# Patient Record
Sex: Female | Born: 1960 | Race: Black or African American | Hispanic: No | Marital: Married | Smoking: Never smoker
Health system: Southern US, Community
[De-identification: ages and names within clinical notes are randomized; demographics above are authoritative.]

## PROBLEM LIST (undated history)

## (undated) DIAGNOSIS — E669 Obesity, unspecified: Secondary | ICD-10-CM

## (undated) DIAGNOSIS — E785 Hyperlipidemia, unspecified: Secondary | ICD-10-CM

## (undated) DIAGNOSIS — T7840XA Allergy, unspecified, initial encounter: Secondary | ICD-10-CM

## (undated) DIAGNOSIS — I10 Essential (primary) hypertension: Secondary | ICD-10-CM

## (undated) DIAGNOSIS — E119 Type 2 diabetes mellitus without complications: Secondary | ICD-10-CM

## (undated) HISTORY — DX: Type 2 diabetes mellitus without complications: E11.9

## (undated) HISTORY — DX: Obesity, unspecified: E66.9

## (undated) HISTORY — DX: Essential (primary) hypertension: I10

## (undated) HISTORY — PX: TUBAL LIGATION: SHX77

## (undated) HISTORY — PX: UMBILICAL HERNIA REPAIR: SHX196

## (undated) HISTORY — DX: Hyperlipidemia, unspecified: E78.5

## (undated) HISTORY — DX: Allergy, unspecified, initial encounter: T78.40XA

---

## 1998-02-21 ENCOUNTER — Ambulatory Visit (HOSPITAL_COMMUNITY): Admission: RE | Admit: 1998-02-21 | Discharge: 1998-02-21 | Payer: Self-pay | Admitting: Family Medicine

## 1998-02-21 ENCOUNTER — Encounter: Payer: Self-pay | Admitting: Family Medicine

## 1998-07-26 ENCOUNTER — Encounter: Payer: Self-pay | Admitting: Obstetrics

## 1998-07-26 ENCOUNTER — Ambulatory Visit (HOSPITAL_COMMUNITY): Admission: RE | Admit: 1998-07-26 | Discharge: 1998-07-26 | Payer: Self-pay | Admitting: Obstetrics

## 1999-02-16 ENCOUNTER — Encounter: Payer: Self-pay | Admitting: Family Medicine

## 1999-02-16 ENCOUNTER — Ambulatory Visit: Admission: RE | Admit: 1999-02-16 | Discharge: 1999-02-16 | Payer: Self-pay | Admitting: Family Medicine

## 1999-08-08 ENCOUNTER — Other Ambulatory Visit: Admission: RE | Admit: 1999-08-08 | Discharge: 1999-08-08 | Payer: Self-pay | Admitting: Obstetrics

## 1999-08-14 ENCOUNTER — Ambulatory Visit (HOSPITAL_COMMUNITY): Admission: RE | Admit: 1999-08-14 | Discharge: 1999-08-14 | Payer: Self-pay | Admitting: Family Medicine

## 1999-08-14 ENCOUNTER — Encounter: Payer: Self-pay | Admitting: Family Medicine

## 2004-09-27 ENCOUNTER — Ambulatory Visit (HOSPITAL_COMMUNITY): Admission: RE | Admit: 2004-09-27 | Discharge: 2004-09-27 | Payer: Self-pay | Admitting: Internal Medicine

## 2004-11-01 ENCOUNTER — Other Ambulatory Visit: Admission: RE | Admit: 2004-11-01 | Discharge: 2004-11-01 | Payer: Self-pay | Admitting: Obstetrics and Gynecology

## 2005-01-18 ENCOUNTER — Ambulatory Visit (HOSPITAL_COMMUNITY): Admission: RE | Admit: 2005-01-18 | Discharge: 2005-01-18 | Payer: Self-pay | Admitting: Obstetrics and Gynecology

## 2005-04-18 ENCOUNTER — Ambulatory Visit (HOSPITAL_COMMUNITY): Admission: RE | Admit: 2005-04-18 | Discharge: 2005-04-18 | Payer: Self-pay | Admitting: Obstetrics and Gynecology

## 2005-06-01 ENCOUNTER — Ambulatory Visit (HOSPITAL_COMMUNITY): Admission: RE | Admit: 2005-06-01 | Discharge: 2005-06-01 | Payer: Self-pay | Admitting: Obstetrics and Gynecology

## 2005-09-07 ENCOUNTER — Ambulatory Visit (HOSPITAL_COMMUNITY): Admission: RE | Admit: 2005-09-07 | Discharge: 2005-09-07 | Payer: Self-pay | Admitting: Obstetrics and Gynecology

## 2007-09-15 ENCOUNTER — Ambulatory Visit (HOSPITAL_COMMUNITY): Admission: RE | Admit: 2007-09-15 | Discharge: 2007-09-15 | Payer: Self-pay | Admitting: Internal Medicine

## 2010-07-28 NOTE — H&P (Signed)
NAMEDENEE, BOEDER                ACCOUNT NO.:  [id number]   MEDICAL RECORD NO.:  [id number]          PATIENT TYPE:  AMB   LOCATION:                                F[facility]ILITY:  [facility]   PHYSICIAN:  Duke Salvia. Marcelle Overlie, M.D.    DATE OF BIRTH:   DATE OF ADMISSION:  01/18/2005  DATE OF DISCHARGE:                                HISTORY & PHYSICAL   CHIEF COMPLAINT:  Request Essure tubal.   HISTORY OF PRESENT ILLNESS:  A 50 year old G2, P2.  Patient is currently on  Ovcon.  Was told previously she could not have a laparoscopic tubal due to  her weight of 311.  Presents now for Essure tubal.  She has been on Ovcon  and understands the need for contraception post procedure.  This procedure  including risks of bleeding, infection, other complications such as  perforation with a micro insert and the need for postoperative contraception  along with the possibility that we may not be able to place the inserts all  reviewed with her which she understands and accepts.   PAST MEDICAL HISTORY:   ALLERGIES:  None.   OPERATIONS:  Cesarean section x2.   Currently on HCTZ, Diovan, and Ovcon 35.   FAMILY HISTORY:  Significant for father who had throat cancer, otherwise  negative.   PHYSICAL EXAMINATION:  VITAL SIGNS:  Temperature 98.7, blood pressure  126/94, weight 311.  HEENT:  Unremarkable.  NECK:  Supple without masses.  LUNGS:  Clear.  CARDIOVASCULAR:  Regular rate and rhythm without murmurs, rubs, or gallops  noted.  BREASTS:  Without masses.  ABDOMEN:  Soft, flat, and nontender.  PELVIC:  Normal external genitalia.  Vagina and cervix clear.  Uterus mid  position, normal size.  Adnexa negative.  Examination difficult due to her  weight.  EXTREMITIES:  Unremarkable.  NEUROLOGIC:  Unremarkable.   IMPRESSION:  Hysteroscopy with Essure placement.  Procedure and risks  discussed as above.     Richard M. Marcelle Overlie, M.D.  Electronically Signed    RMH/MEDQ  D:  01/15/2005  T:  01/15/2005   Job:  [id number]

## 2010-07-28 NOTE — Op Note (Signed)
[name], [name]                ACCOUNT NO.:  [id number]   MEDICAL RECORD NO.:  [id number]          PATIENT TYPE:  AMB   LOCATION:  [facility]                           [facility]ILITY:  [facility]   PHYSICIAN:  Duke Salvia. Marcelle Overlie, M.D.DATE OF BIRTH:  Jan 13, 1961   DATE OF PROCEDURE:  01/18/2005  DATE OF DISCHARGE:                                 OPERATIVE REPORT   PREOPERATIVE DIAGNOSIS:  Request permanent sterilization.   POSTOPERATIVE DIAGNOSIS:  Request permanent sterilization.   PROCEDURE:  Hysteroscopy with Essure micro inserts.   ANESTHESIA:  General endotracheal.   COMPLICATIONS:  None.   DRAINS:  In-and-out Foley catheter.   BLOOD LOSS:  Minimal.   DESCRIPTION OF PROCEDURE:  The patient was taken to the operating room and  after adequate level of general endotracheal anesthesia was obtained with  the patient legs stirrups. The perineum and vagina prepped and draped with  Betadine and the bladder was drained, EUA was carried out.  The uterus was  midposition and normal size. Adnexa negative. Exam difficult due to her  weight of 311.   Weighted speculum was positioned. Cervix grasped with tenaculum.  Paracervical block created. The uterus then sounded to 10 cm and  progressively dilated to a 29 Pratt dilator. The 7 mm continuous flow  hysteroscope was inserted. There was very thin endometrium. Both tubal ostia  could be readily identified. Starting on her left, the Essure micro insert  was inserted and disengaged per protocol with 0 coils showing on that side.  On the right side she had after engaging the device 15 coils showing on the  right with placement per protocol. She tolerated this well and went to  recovery room in good condition.      Richard M. Marcelle Overlie, M.D.  Electronically Signed     RMH/MEDQ  D:  01/18/2005  T:  01/18/2005  Job:  [id number]

## 2010-07-28 NOTE — Op Note (Signed)
[name], [name]                ACCOUNT NO.:  [id number]   MEDICAL RECORD NO.:  [id number]          PATIENT TYPE:  AMB   LOCATION:  [facility]                           [facility]   PHYSICIAN:  Duke Salvia. Marcelle Overlie, M.D.DATE OF BIRTH:  [birth year]   DATE OF PROCEDURE:  06/01/2005  DATE OF DISCHARGE:  06/01/2005                                 OPERATIVE REPORT   PREOPERATIVE DIAGNOSIS:  Requests permanent sterilization.   POSTOPERATIVE DIAGNOSIS:  Requests permanent sterilization.   PROCEDURE:  Replacement of the right-sided Essure microimplant.  Her initial  postop HSG had shown occlusion of the left only with expulsion of the right  device.  The procedure was hysteroscopy with placement of Essure micro  insert on the right tube.  This patient did receive Toradol IM  preoperatively along with prophylactic antibiotics.   DESCRIPTION OF PROCEDURE:  The patient was taken to the operating room and  after adequate level of general endotracheal anesthesia was obtained with  the patient's legs in stirrups, perineum and vagina prepped and draped with  Betadine.  Paracervical block was created by infiltrating at 3 and 9 o'clock  submucosally 5-7 ml 1% Xylocaine on either side after negative aspiration.  The cervix was then dilated to a 27 Pratt dilator. A 5.5 mm continuous flow  hysteroscope was inserted.  The right tube could be easily identified with  careful positioning. The Essure device was deployed per protocol with 2-3  remaining coils visible after application.  The left side had previously  been occluded and had been documented.  She tolerated this well and went to  recovery room in good condition.      Richard M. Marcelle Overlie, M.D.  Electronically Signed     RMH/MEDQ  D:  06/01/2005  T:  06/04/2005  Job:  [id number]

## 2010-07-28 NOTE — H&P (Signed)
NAMEWENDY, [name] NO.:  [id number]   MEDICAL RECORD NO.:  [id number]          PATIENT TYPE:  AMB   LOCATION:                                [facility]   PHYSICIAN:  Duke Salvia. Marcelle Overlie, M.D.DATE OF BIRTH:  February 02, 1961   DATE OF ADMISSION:  06/01/2005  DATE OF DISCHARGE:  06/01/2005                                HISTORY & PHYSICAL   CHIEF COMPLAINT:  For Essure tubal ligation.   HISTORY OF PRESENT ILLNESS:  A 50 year old, currently on 61.  She is a  gravida 2, para 2.  She underwent Essure tubal January 18, 2005 when we did  her first three-month postoperative x-ray.  She had good occlusion on the  left.  We could not find the insert on the right side.  Presumably it was  passed vaginally.  Since it could not be located. She has continued to be on  her pills and consents for reinsertion of the right insert.  Risk of  bleeding, infection, possible need for additional surgery, all reviewed  including the need for contraception for the ensuing three months.   ALLERGIES:  None.   CURRENT MEDICATIONS:  Ovcon, hydrochlorothiazide, and Diovan.   FAMILY HISTORY:  Significant for father with throat cancer and the patient  has had high blood pressure and is being treated.   PHYSICAL EXAMINATION:  VITAL SIGNS:  Temperature 98.2, blood pressure  126/94, weight 311.  HEENT:  Unremarkable.  NECK:  Supple without masses.  LUNGS:  Clear.  CARDIOVASCULAR:  Regular rate and rhythm without murmurs, rubs or gallops.  BREASTS:  Without masses.  ABDOMEN:  Soft, flat and nontender.  PELVIC:  Normal external genitalia.  Vagina and cervix clear.  Uterus normal  position and size.   IMPRESSION:  Normal exam.   PLAN:  Repeat Essure, right side.  Procedure and risks reviewed as above.      Richard M. Marcelle Overlie, M.D.  Electronically Signed     RMH/MEDQ  D:  05/31/2005  T:  05/31/2005  Job:  [id number]

## 2013-01-26 ENCOUNTER — Other Ambulatory Visit: Payer: Self-pay | Admitting: Internal Medicine

## 2013-01-26 DIAGNOSIS — E782 Mixed hyperlipidemia: Secondary | ICD-10-CM

## 2013-01-26 MED ORDER — SIMVASTATIN 10 MG PO TABS: 10.0000 mg | ORAL_TABLET | Freq: Every evening | ORAL | Status: DC

## 2013-02-16 ENCOUNTER — Encounter: Payer: Self-pay | Admitting: Internal Medicine

## 2013-02-16 DIAGNOSIS — R7303 Prediabetes: Secondary | ICD-10-CM | POA: Insufficient documentation

## 2013-02-16 DIAGNOSIS — E785 Hyperlipidemia, unspecified: Secondary | ICD-10-CM | POA: Insufficient documentation

## 2013-02-16 DIAGNOSIS — I10 Essential (primary) hypertension: Secondary | ICD-10-CM | POA: Insufficient documentation

## 2013-02-16 DIAGNOSIS — Z9109 Other allergy status, other than to drugs and biological substances: Secondary | ICD-10-CM | POA: Insufficient documentation

## 2013-02-17 ENCOUNTER — Ambulatory Visit (INDEPENDENT_AMBULATORY_CARE_PROVIDER_SITE_OTHER): Payer: 59 | Admitting: Emergency Medicine

## 2013-02-17 ENCOUNTER — Encounter: Payer: Self-pay | Admitting: Emergency Medicine

## 2013-02-17 VITALS — BP 128/88 | HR 76 | Temp 98.6°F | Resp 18 | Ht 65.5 in | Wt 310.0 lb

## 2013-02-17 DIAGNOSIS — R062 Wheezing: Secondary | ICD-10-CM

## 2013-02-17 DIAGNOSIS — E782 Mixed hyperlipidemia: Secondary | ICD-10-CM

## 2013-02-17 DIAGNOSIS — Z79899 Other long term (current) drug therapy: Secondary | ICD-10-CM

## 2013-02-17 DIAGNOSIS — R05 Cough: Secondary | ICD-10-CM

## 2013-02-17 DIAGNOSIS — E669 Obesity, unspecified: Secondary | ICD-10-CM

## 2013-02-17 DIAGNOSIS — I10 Essential (primary) hypertension: Secondary | ICD-10-CM

## 2013-02-17 DIAGNOSIS — E119 Type 2 diabetes mellitus without complications: Secondary | ICD-10-CM

## 2013-02-17 DIAGNOSIS — E559 Vitamin D deficiency, unspecified: Secondary | ICD-10-CM

## 2013-02-17 LAB — CBC WITH DIFFERENTIAL/PLATELET
Basophils Absolute: 0 10*3/uL (ref 0.0–0.1)
Basophils Relative: 0 % (ref 0–1)
Hemoglobin: 12.9 g/dL (ref 12.0–15.0)
Lymphocytes Relative: 46 % (ref 12–46)
Lymphs Abs: 2.5 10*3/uL (ref 0.7–4.0)
MCHC: 34.5 g/dL (ref 30.0–36.0)
MCV: 82.7 fL (ref 78.0–100.0)
Monocytes Relative: 9 % (ref 3–12)
Neutro Abs: 2.4 10*3/uL (ref 1.7–7.7)
Neutrophils Relative %: 43 % (ref 43–77)
Platelets: 387 10*3/uL (ref 150–400)
RBC: 4.52 MIL/uL (ref 3.87–5.11)
RDW: 14.8 % (ref 11.5–15.5)
WBC: 5.5 10*3/uL (ref 4.0–10.5)

## 2013-02-17 LAB — HEMOGLOBIN A1C
Hgb A1c MFr Bld: 6.2 % — ABNORMAL HIGH (ref <5.7)
Mean Plasma Glucose: 131 mg/dL — ABNORMAL HIGH (ref <117)

## 2013-02-17 LAB — BASIC METABOLIC PANEL WITH GFR
CO2: 29 mEq/L (ref 19–32)
Calcium: 9.2 mg/dL (ref 8.4–10.5)
GFR, Est African American: 89 mL/min

## 2013-02-17 LAB — HEPATIC FUNCTION PANEL
AST: 20 U/L (ref 0–37)
Bilirubin, Direct: 0.1 mg/dL (ref 0.0–0.3)
Indirect Bilirubin: 0.4 mg/dL (ref 0.0–0.9)

## 2013-02-17 LAB — LIPID PANEL
Triglycerides: 123 mg/dL (ref <150)
VLDL: 25 mg/dL (ref 0–40)

## 2013-02-17 MED ORDER — BENZONATATE 100 MG PO CAPS: 100.0000 mg | ORAL_CAPSULE | Freq: Three times a day (TID) | ORAL | Status: DC | PRN

## 2013-02-17 MED ORDER — MONTELUKAST SODIUM 10 MG PO TABS: 10.0000 mg | ORAL_TABLET | Freq: Every day | ORAL | Status: DC

## 2013-02-17 MED ORDER — AZITHROMYCIN 250 MG PO TABS: ORAL_TABLET | ORAL | Status: DC

## 2013-02-17 MED ORDER — ALBUTEROL SULFATE HFA 108 (90 BASE) MCG/ACT IN AERS: 2.0000 | INHALATION_SPRAY | Freq: Four times a day (QID) | RESPIRATORY_TRACT | Status: DC | PRN

## 2013-02-17 MED ORDER — PREDNISONE 10 MG PO TABS: ORAL_TABLET | ORAL | Status: DC

## 2013-02-17 NOTE — Patient Instructions (Addendum)
Fat and Cholesterol Control Diet Fat and cholesterol levels in your blood and organs are influenced by your diet. High levels of fat and cholesterol may lead to diseases of the heart, small and large blood vessels, gallbladder, liver, and pancreas. CONTROLLING FAT AND CHOLESTEROL WITH DIET Although exercise and lifestyle factors are important, your diet is key. That is because certain foods are known to raise cholesterol and others to lower it. The goal is to balance foods for their effect on cholesterol and more importantly, to replace saturated and trans fat with other types of fat, such as monounsaturated fat, polyunsaturated fat, and omega-3 fatty acids. On average, a person should consume no more than 15 to 17 g of saturated fat daily. Saturated and trans fats are considered "bad" fats, and they will raise LDL cholesterol. Saturated fats are primarily found in animal products such as meats, butter, and cream. However, that does not mean you need to give up all your favorite foods. Today, there are good tasting, low-fat, low-cholesterol substitutes for most of the things you like to eat. Choose low-fat or nonfat alternatives. Choose round or loin cuts of red meat. These types of cuts are lowest in fat and cholesterol. Chicken (without the skin), fish, veal, and ground turkey breast are great choices. Eliminate fatty meats, such as hot dogs and salami. Even shellfish have little or no saturated fat. Have a 3 oz (85 g) portion when you eat lean meat, poultry, or fish. Trans fats are also called "partially hydrogenated oils." They are oils that have been scientifically manipulated so that they are solid at room temperature resulting in a longer shelf life and improved taste and texture of foods in which they are added. Trans fats are found in stick margarine, some tub margarines, cookies, crackers, and baked goods.  When baking and cooking, oils are a great substitute for butter. The monounsaturated oils are  especially beneficial since it is believed they lower LDL and raise HDL. The oils you should avoid entirely are saturated tropical oils, such as coconut and palm.  Remember to eat a lot from food groups that are naturally free of saturated and trans fat, including fish, fruit, vegetables, beans, grains (barley, rice, couscous, bulgur wheat), and pasta (without cream sauces).  IDENTIFYING FOODS THAT LOWER FAT AND CHOLESTEROL  Soluble fiber may lower your cholesterol. This type of fiber is found in fruits such as apples, vegetables such as broccoli, potatoes, and carrots, legumes such as beans, peas, and lentils, and grains such as barley. Foods fortified with plant sterols (phytosterol) may also lower cholesterol. You should eat at least 2 g per day of these foods for a cholesterol lowering effect.  Read package labels to identify low-saturated fats, trans fat free, and low-fat foods at the supermarket. Select cheeses that have only 2 to 3 g saturated fat per ounce. Use a heart-healthy tub margarine that is free of trans fats or partially hydrogenated oil. When buying baked goods (cookies, crackers), avoid partially hydrogenated oils. Breads and muffins should be made from whole grains (whole-wheat or whole oat flour, instead of "flour" or "enriched flour"). Buy non-creamy canned soups with reduced salt and no added fats.  FOOD PREPARATION TECHNIQUES  Never deep-fry. If you must fry, either stir-fry, which uses very little fat, or use non-stick cooking sprays. When possible, broil, bake, or roast meats, and steam vegetables. Instead of putting butter or margarine on vegetables, use lemon and herbs, applesauce, and cinnamon (for squash and sweet potatoes). Use nonfat   yogurt, salsa, and low-fat dressings for salads.  LOW-SATURATED FAT / LOW-FAT FOOD SUBSTITUTES Meats / Saturated Fat (g)  Avoid: Steak, marbled (3 oz/85 g) / 11 g  Choose: Steak, lean (3 oz/85 g) / 4 g  Avoid: Hamburger (3 oz/85 g) / 7  g  Choose: Hamburger, lean (3 oz/85 g) / 5 g  Avoid: Ham (3 oz/85 g) / 6 g  Choose: Ham, lean cut (3 oz/85 g) / 2.4 g  Avoid: Chicken, with skin, dark meat (3 oz/85 g) / 4 g  Choose: Chicken, skin removed, dark meat (3 oz/85 g) / 2 g  Avoid: Chicken, with skin, light meat (3 oz/85 g) / 2.5 g  Choose: Chicken, skin removed, light meat (3 oz/85 g) / 1 g Dairy / Saturated Fat (g)  Avoid: Whole milk (1 cup) / 5 g  Choose: Low-fat milk, 2% (1 cup) / 3 g  Choose: Low-fat milk, 1% (1 cup) / 1.5 g  Choose: Skim milk (1 cup) / 0.3 g  Avoid: Hard cheese (1 oz/28 g) / 6 g  Choose: Skim milk cheese (1 oz/28 g) / 2 to 3 g  Avoid: Cottage cheese, 4% fat (1 cup) / 6.5 g  Choose: Low-fat cottage cheese, 1% fat (1 cup) / 1.5 g  Avoid: Ice cream (1 cup) / 9 g  Choose: Sherbet (1 cup) / 2.5 g  Choose: Nonfat frozen yogurt (1 cup) / 0.3 g  Choose: Frozen fruit bar / trace  Avoid: Whipped cream (1 tbs) / 3.5 g  Choose: Nondairy whipped topping (1 tbs) / 1 g Condiments / Saturated Fat (g)  Avoid: Mayonnaise (1 tbs) / 2 g  Choose: Low-fat mayonnaise (1 tbs) / 1 g  Avoid: Butter (1 tbs) / 7 g  Choose: Extra light margarine (1 tbs) / 1 g  Avoid: Coconut oil (1 tbs) / 11.8 g  Choose: Olive oil (1 tbs) / 1.8 g  Choose: Corn oil (1 tbs) / 1.7 g  Choose: Safflower oil (1 tbs) / 1.2 g  Choose: Sunflower oil (1 tbs) / 1.4 g  Choose: Soybean oil (1 tbs) / 2.4 g  Choose: Canola oil (1 tbs) / 1 g Document Released: 02/26/2005 Document Revised: 06/23/2012 Document Reviewed: 08/17/2010 [employer] Patient Information 2014 [place], [place]. Diabetes and Exercise Exercising regularly is important. It is not just about losing weight. It has many health benefits, such as:  Improving your overall fitness, flexibility, and endurance.  Increasing your bone density.  Helping with weight control.  Decreasing your body fat.  Increasing your muscle strength.  Reducing stress and  tension.  Improving your overall health. People with diabetes who exercise gain additional benefits because exercise:  Reduces appetite.  Improves the body's use of blood sugar (glucose).  Helps lower or control blood glucose.  Decreases blood pressure.  Helps control blood lipids (such as cholesterol and triglycerides).  Improves the body's use of the hormone insulin by:  Increasing the body's insulin sensitivity.  Reducing the body's insulin needs.  Decreases the risk for heart disease because exercising:  Lowers cholesterol and triglycerides levels.  Increases the levels of good cholesterol (such as high-density lipoproteins [HDL]) in the body.  Lowers blood glucose levels. YOUR ACTIVITY PLAN  Choose an activity that you enjoy and set realistic goals. Your health care provider or diabetes educator can help you make an activity plan that works for you. You can break activities into 2 or 3 sessions throughout the day. Doing so is as good  as one long session. Exercise ideas include:  Taking the dog for a walk.  Taking the stairs instead of the elevator.  Dancing to your favorite song.  Doing your favorite exercise with a friend. RECOMMENDATIONS FOR EXERCISING WITH TYPE 1 OR TYPE 2 DIABETES   Check your blood glucose before exercising. If blood glucose levels are greater than 240 mg/dL, check for urine ketones. Do not exercise if ketones are present.  Avoid injecting insulin into areas of the body that are going to be exercised. For example, avoid injecting insulin into:  The arms when playing tennis.  The legs when jogging.  Keep a record of:  Food intake before and after you exercise.  Expected peak times of insulin action.  Blood glucose levels before and after you exercise.  The type and amount of exercise you have done.  Review your records with your health care provider. Your health care provider will help you to develop guidelines for adjusting food  intake and insulin amounts before and after exercising.  If you take insulin or oral hypoglycemic agents, watch for signs and symptoms of hypoglycemia. They include:  Dizziness.  Shaking.  Sweating.  Chills.  Confusion.  Drink plenty of water while you exercise to prevent dehydration or heat stroke. Body water is lost during exercise and must be replaced.  Talk to your health care provider before starting an exercise program to make sure it is safe for you. Remember, almost any type of activity is better than none. Document Released: 05/19/2003 Document Revised: 10/29/2012 Document Reviewed: 08/05/2012 [facility] Patient Information 2014 [place], [place]. Bronchitis Bronchitis is a problem of the air tubes leading to your lungs. This problem makes it hard for air to get in and out of the lungs. You may cough a lot because your air tubes are narrow. Going without care can cause lasting (chronic) bronchitis. HOME CARE   Drink enough fluids to keep your pee (urine) clear or pale yellow.  Use a cool mist humidifier.  Quit smoking if you smoke. If you keep smoking, the bronchitis might not get better.  Only take medicine as told by your doctor. GET HELP RIGHT AWAY IF:   Coughing keeps you awake.  You start to wheeze.  You become more sick or weak.  You have a hard time breathing or get short of breath.  You cough up blood.  Coughing lasts more than 2 weeks.  You have a fever.  Your baby is older than 3 months with a rectal temperature of 102 F (38.9 C) or higher.  Your baby is 7 months old or younger with a rectal temperature of 100.4 F (38 C) or higher. MAKE SURE YOU:  Understand these instructions.  Will watch your condition.  Will get help right away if you are not doing well or get worse. Document Released: 08/15/2007 Document Revised: 05/21/2011 Document Reviewed: 10/21/2012 [facility] Patient Information 2014 [place], [place].

## 2013-02-18 ENCOUNTER — Encounter: Payer: Self-pay | Admitting: Emergency Medicine

## 2013-02-18 NOTE — Progress Notes (Signed)
Subjective:    Patient ID: [name], female    DOB: 06/16/1960, 52 y.o.   MRN: [id number]  HPI Comments: 52 yo AAF presents for 3 month F/U for obesity, HTN, Cholesterol, DM, D. Deficient. She started metformin QD AD but d/c due to mild stomach upset. She is trying to improve diet and lose weight. She is not exercising routinely. LAST ABN LABS T 202 LDL 129 A1C 6.5 D 20 Hr Bp has been good at home. BS she does not routinely check  She has noticed increased wheezing and hard cough over last 2 weeks that occasional makes her vomit with the forceful spasms. No relief with Mucinex. She denies reflux or chest production.   Hypertension  Hyperlipidemia    Current Outpatient Prescriptions on File Prior to Visit  Medication Sig Dispense Refill  . Azelastine-Fluticasone 137-50 MCG/ACT SUSP Place into the nose.      . cetirizine (ZYRTEC) 10 MG tablet Take 10 mg by mouth daily.      . ergocalciferol (VITAMIN D2) 50000 UNITS capsule Take 50,000 Units by mouth once a week.      . metFORMIN (GLUCOPHAGE-XR) 500 MG 24 hr tablet Take 500 mg by mouth 3 (three) times daily.      . mometasone (NASONEX) 50 MCG/ACT nasal spray Place 2 sprays into the nose as needed.      . ranitidine (ZANTAC) 75 MG tablet Take 75 mg by mouth 2 (two) times daily.      . simvastatin (ZOCOR) 10 MG tablet Take 1 tablet (10 mg total) by mouth every evening.  90 tablet  3  . valsartan-hydrochlorothiazide (DIOVAN-HCT) 320-25 MG per tablet Take 1 tablet by mouth daily.       No current facility-administered medications on file prior to visit.    Review of patient's allergies indicates no known allergies.  Past Medical History  Diagnosis Date  . Type II or unspecified type diabetes mellitus without mention of complication, not stated as uncontrolled   . Hypertension   . Hyperlipidemia   . Allergy   . Obesity      Review of Systems  Respiratory: Positive for cough and wheezing.   All other systems reviewed and are  negative.    BP 128/88  Pulse 76  Temp(Src) 98.6 F (37 C) (Temporal)  Resp 18  Ht 5' 5.5" (1.664 m)  Wt 310 lb (140.615 kg)  BMI 50.78 kg/m2  LMP 02/04/2013     Objective:   Physical Exam  Nursing note and vitals reviewed. Constitutional: She is oriented to person, place, and time. She appears well-developed and well-nourished. No distress.  Obese  HENT:  Head: Normocephalic and atraumatic.  Right Ear: External ear normal.  Left Ear: External ear normal.  Nose: Nose normal.  Mouth/Throat: Oropharynx is clear and moist. No oropharyngeal exudate.  Cloudy TMs  Eyes: Conjunctivae and EOM are normal.  Neck: Normal range of motion. Neck supple. No JVD present. No thyromegaly present.  Cardiovascular: Normal rate, regular rhythm, normal heart sounds and intact distal pulses.   Pulmonary/Chest: Effort normal. She has wheezes.  Clears with hard cough  Abdominal: Soft. Bowel sounds are normal. She exhibits no distension and no mass. There is no tenderness. There is no rebound and no guarding.  Musculoskeletal: Normal range of motion. She exhibits no edema and no tenderness.  Lymphadenopathy:    She has no cervical adenopathy.  Neurological: She is alert and oriented to person, place, and time. No cranial nerve deficit.  Skin: Skin is warm and dry. No rash noted. No erythema. No pallor.  Psychiatric: She has a normal mood and affect. Her behavior is normal. Judgment and thought content normal.          Assessment & Plan:  1. 3 month F/U for Obesity, HTN, Cholesterol, DM, D. Deficient- Check labs, need significant weight loss, healthier diet and cardio QD, Restart MF with largest meal call with results 2. Cough with wheeze- Albuterol HFA, Pred 10 mg DP, Singulair 10 mg, tessalon Perles AD All AD, if increase with symptoms ZPAK AD. If completes treatment and change may need eval for reflux vs chronic bronchitis

## 2013-02-20 ENCOUNTER — Ambulatory Visit: Payer: Self-pay | Admitting: Emergency Medicine

## 2013-03-31 ENCOUNTER — Other Ambulatory Visit: Payer: Self-pay | Admitting: Internal Medicine

## 2013-08-19 ENCOUNTER — Ambulatory Visit (INDEPENDENT_AMBULATORY_CARE_PROVIDER_SITE_OTHER): Payer: 59 | Admitting: Emergency Medicine

## 2013-08-19 ENCOUNTER — Encounter: Payer: Self-pay | Admitting: Emergency Medicine

## 2013-08-19 VITALS — BP 154/108 | HR 68 | Temp 98.2°F | Resp 18 | Ht 65.5 in | Wt 316.0 lb

## 2013-08-19 DIAGNOSIS — E559 Vitamin D deficiency, unspecified: Secondary | ICD-10-CM

## 2013-08-19 DIAGNOSIS — M25519 Pain in unspecified shoulder: Secondary | ICD-10-CM

## 2013-08-19 DIAGNOSIS — E782 Mixed hyperlipidemia: Secondary | ICD-10-CM

## 2013-08-19 DIAGNOSIS — Z0000 Encounter for general adult medical examination without abnormal findings: Secondary | ICD-10-CM

## 2013-08-19 DIAGNOSIS — Z111 Encounter for screening for respiratory tuberculosis: Secondary | ICD-10-CM

## 2013-08-19 DIAGNOSIS — E669 Obesity, unspecified: Secondary | ICD-10-CM

## 2013-08-19 DIAGNOSIS — Z79899 Other long term (current) drug therapy: Secondary | ICD-10-CM

## 2013-08-19 DIAGNOSIS — Z23 Encounter for immunization: Secondary | ICD-10-CM

## 2013-08-19 DIAGNOSIS — I10 Essential (primary) hypertension: Secondary | ICD-10-CM

## 2013-08-19 LAB — HEMOGLOBIN A1C
HEMOGLOBIN A1C: 6.2 % — AB (ref <5.7)
Mean Plasma Glucose: 131 mg/dL — ABNORMAL HIGH (ref <117)

## 2013-08-19 LAB — CBC WITH DIFFERENTIAL/PLATELET
BASOS ABS: 0 10*3/uL (ref 0.0–0.1)
BASOS PCT: 0 % (ref 0–1)
Eosinophils Absolute: 0.1 10*3/uL (ref 0.0–0.7)
Eosinophils Relative: 1 % (ref 0–5)
HCT: 40.6 % (ref 36.0–46.0)
HEMOGLOBIN: 13.7 g/dL (ref 12.0–15.0)
LYMPHS ABS: 2.4 10*3/uL (ref 0.7–4.0)
Lymphocytes Relative: 48 % — ABNORMAL HIGH (ref 12–46)
MCH: 28.2 pg (ref 26.0–34.0)
MCHC: 33.7 g/dL (ref 30.0–36.0)
MCV: 83.7 fL (ref 78.0–100.0)
MONO ABS: 0.5 10*3/uL (ref 0.1–1.0)
Monocytes Relative: 9 % (ref 3–12)
Neutro Abs: 2.1 10*3/uL (ref 1.7–7.7)
Neutrophils Relative %: 42 % — ABNORMAL LOW (ref 43–77)
Platelets: 325 10*3/uL (ref 150–400)
RBC: 4.85 MIL/uL (ref 3.87–5.11)
RDW: 15 % (ref 11.5–15.5)
WBC: 5.1 10*3/uL (ref 4.0–10.5)

## 2013-08-19 NOTE — Patient Instructions (Addendum)
Tetanus, Diphtheria, Pertussis (Tdap) Vaccine What You Need to Know WHY GET VACCINATED? Tetanus, diphtheria and pertussis can be very serious diseases, even for adolescents and adults. Tdap vaccine can protect us from these diseases. TETANUS (Lockjaw) causes painful muscle tightening and stiffness, usually all over the body.  It can lead to tightening of muscles in the head and neck so you can't open your mouth, swallow, or sometimes even breathe. Tetanus kills about 1 out of 5 people who are infected. DIPHTHERIA can cause a thick coating to form in the back of the throat.  It can lead to breathing problems, paralysis, heart failure, and death. PERTUSSIS (Whooping Cough) causes severe coughing spells, which can cause difficulty breathing, vomiting and disturbed sleep.  It can also lead to weight loss, incontinence, and rib fractures. Up to 2 in 100 adolescents and 5 in 100 adults with pertussis are hospitalized or have complications, which could include pneumonia and death. These diseases are caused by bacteria. Diphtheria and pertussis are spread from person to person through coughing or sneezing. Tetanus enters the body through cuts, scratches, or wounds. Before vaccines, the [place] saw as many as 200,000 cases a year of diphtheria and pertussis, and hundreds of cases of tetanus. Since vaccination began, tetanus and diphtheria have dropped by about 99% and pertussis by about 80%. TDAP VACCINE Tdap vaccine can protect adolescents and adults from tetanus, diphtheria, and pertussis. One dose of Tdap is routinely given at age 11 or 12. People who did not get Tdap at that age should get it as soon as possible. Tdap is especially important for health care professionals and anyone having close contact with a baby younger than 12 months. Pregnant women should get a dose of Tdap during every pregnancy, to protect the newborn from pertussis. Infants are most at risk for severe, life-threatening  complications from pertussis. A similar vaccine, called Td, protects from tetanus and diphtheria, but not pertussis. A Td booster should be given every 10 years. Tdap may be given as one of these boosters if you have not already gotten a dose. Tdap may also be given after a severe cut or burn to prevent tetanus infection. Your doctor can give you more information. Tdap may safely be given at the same time as other vaccines. SOME PEOPLE SHOULD NOT GET THIS VACCINE  If you ever had a life-threatening allergic reaction after a dose of any tetanus, diphtheria, or pertussis containing vaccine, OR if you have a severe allergy to any part of this vaccine, you should not get Tdap. Tell your doctor if you have any severe allergies.  If you had a coma, or long or multiple seizures within 7 days after a childhood dose of DTP or DTaP, you should not get Tdap, unless a cause other than the vaccine was found. You can still get Td.  Talk to your doctor if you:  have epilepsy or another nervous system problem,  had severe pain or swelling after any vaccine containing diphtheria, tetanus or pertussis,  ever had Guillain-Barr Syndrome (GBS),  aren't feeling well on the day the shot is scheduled. RISKS OF A VACCINE REACTION With any medicine, including vaccines, there is a chance of side effects. These are usually mild and go away on their own, but serious reactions are also possible. Brief fainting spells can follow a vaccination, leading to injuries from falling. Sitting or lying down for about 15 minutes can help prevent these. Tell your doctor if you feel dizzy or light-headed, or   have vision changes or ringing in the ears. Mild problems following Tdap (Did not interfere with activities)  Pain where the shot was given (about 3 in 4 adolescents or 2 in 3 adults)  Redness or swelling where the shot was given (about 1 person in 5)  Mild fever of at least 100.71F (up to about 1 in 25 adolescents or 1 in  100 adults)  Headache (about 3 or 4 people in 10)  Tiredness (about 1 person in 3 or 4)  Nausea, vomiting, diarrhea, stomach ache (up to 1 in 4 adolescents or 1 in 10 adults)  Chills, body aches, sore joints, rash, swollen glands (uncommon) Moderate problems following Tdap (Interfered with activities, but did not require medical attention)  Pain where the shot was given (about 1 in 5 adolescents or 1 in 100 adults)  Redness or swelling where the shot was given (up to about 1 in 16 adolescents or 1 in 25 adults)  Fever over 102F (about 1 in 100 adolescents or 1 in 250 adults)  Headache (about 3 in 20 adolescents or 1 in 10 adults)  Nausea, vomiting, diarrhea, stomach ache (up to 1 or 3 people in 100)  Swelling of the entire arm where the shot was given (up to about 3 in 100). Severe problems following Tdap (Unable to perform usual activities, required medical attention)  Swelling, severe pain, bleeding and redness in the arm where the shot was given (rare). A severe allergic reaction could occur after any vaccine (estimated less than 1 in a million doses). WHAT IF THERE IS A SERIOUS REACTION? What should I look for?  Look for anything that concerns you, such as signs of a severe allergic reaction, very high fever, or behavior changes. Signs of a severe allergic reaction can include hives, swelling of the face and throat, difficulty breathing, a fast heartbeat, dizziness, and weakness. These would start a few minutes to a few hours after the vaccination. What should I do?  If you think it is a severe allergic reaction or other emergency that can't wait, call 9-1-1 or get the person to the nearest hospital. Otherwise, call your doctor.  Afterward, the reaction should be reported to the "Vaccine Adverse Event Reporting System" (VAERS). Your doctor might file this report, or you can do it yourself through the VAERS web site at www.vaers.SamedayNews.es, or by calling 202-138-8255. VAERS is  only for reporting reactions. They do not give medical advice.  THE NATIONAL VACCINE INJURY COMPENSATION PROGRAM The National Vaccine Injury Compensation Program (VICP) is a federal program that was created to compensate people who may have been injured by certain vaccines. Persons who believe they may have been injured by a vaccine can learn about the program and about filing a claim by calling 720-047-2805 or visiting the [place] website at GoldCloset.com.ee. HOW CAN I LEARN MORE?  Ask your doctor.  Call your local or state health department.  Contact the Centers for Disease Control and Prevention (CDC):  Call 4385164996 or visit CDC's website at http://hunter.com/. CDC Tdap Vaccine VIS (07/19/11) Document Released: 08/28/2011 Document Revised: 06/23/2012 Document Reviewed: 06/18/2012 [facility] Patient Information 2014 [place], [place].   We want weight loss that will last so you should lose 1-2 pounds a week.  THAT IS IT! Please pick THREE things a month to change. Once it is a habit check off the item. Then pick another three items off the list to become habits.  If you are already doing a habit on the list GREAT!  Cross that item off! o Don't drink your calories. Ie, alcohol, soda, fruit juice, and sweet tea.  o Drink more water. Drink a glass when you feel hungry or before each meal.  o Eat breakfast - Complex carb and protein (likeDannon light and fit yogurt, oatmeal, fruit, eggs, [place] bacon). o Measure your cereal.  Eat no more than one cup a day. (ie [place]) o Eat an apple a day. o Add a vegetable a day. o Try a new vegetable a month. o Use Pam! Stop using oil or butter to cook. o Don't finish your plate or use smaller plates. o Share your dessert. o Eat sugar free Jello for dessert or frozen grapes. o Don't eat 2-3 hours before bed. o Switch to whole wheat bread, pasta, and brown rice. o Make healthier choices when you eat out. No fries! o Pick baked  chicken, NOT fried. o Don't forget to SLOW DOWN when you eat. It is not going anywhere.  o Take the stairs. o Park far away in the parking lot o [employer] (or weights) for 10 minutes while watching TV. o Walk at work for 10 minutes during break. o Walk outside 1 time a week with your friend, kids, dog, or significant other. o Start a walking group at [place] the mall as much as you can tolerate.  o Keep a food diary. o Weigh yourself daily. o Walk for 15 minutes 3 days per week. o Cook at home more often and eat out less.  If life happens and you go back to old habits, it is okay.  Just start over. You can do it!   If you experience chest pain, get short of breath, or tired during the exercise, please stop immediately and inform your doctor.

## 2013-08-19 NOTE — Progress Notes (Signed)
Subjective:    Patient ID: [name], female    DOB: 1960-10-04, 53 y.o.   MRN: [id number]  HPI Comments: 53 YO pleasant Obese AAF for CPE and presents for 3 month F/U for HTN, Cholesterol, Dm, D. Deficient. She did add MF for insulin resistance but has not lost a lot of weight. She notes BP was in 140/80s before stress increased.She is down 13# from last CPE. She is trying to lose weight. She is increasing activity.  WBC             5.5   02/17/2013 HGB            12.9   02/17/2013 HCT            37.4   02/17/2013 PLT             387   02/17/2013 GLUCOSE          81   02/17/2013 CHOL            199   02/17/2013 TRIG            123   02/17/2013 HDL              51   02/17/2013 LDLCALC         123   02/17/2013 ALT              16   02/17/2013 AST              20   02/17/2013 NA              142   02/17/2013 K               4.0   02/17/2013 CL              104   02/17/2013 CREATININE     0.88   02/17/2013 BUN               8   02/17/2013 CO2              29   02/17/2013 HGBA1C          6.2   02/17/2013 She notes decreased ROM with Right shoulder. She notes difficulty going behind back. She denies injury or strain.    Hyperlipidemia  Hypertension     Medication List       This list is accurate as of: 08/19/13 11:59 PM.  Always use your most recent med list.               albuterol 108 (90 BASE) MCG/ACT inhaler  Commonly known as:  PROVENTIL HFA;VENTOLIN HFA  Inhale 2 puffs into the lungs every 6 (six) hours as needed for wheezing or shortness of breath.     Azelastine-Fluticasone 137-50 MCG/ACT Susp  Place into the nose.     cetirizine 10 MG tablet  Commonly known as:  ZYRTEC  Take 10 mg by mouth daily as needed.     ergocalciferol 50000 UNITS capsule  Commonly known as:  VITAMIN D2  Take 50,000 Units by mouth once a week.     metFORMIN 500 MG 24 hr tablet  Commonly known as:  GLUCOPHAGE-XR  Take 500 mg by mouth daily.     NASONEX 50 MCG/ACT nasal spray  Generic drug:   mometasone  Place 2 sprays into the nose as needed.     ranitidine 75 MG tablet  Commonly known as:  ZANTAC  Take 75 mg by mouth 2 (two) times daily.     simvastatin 10 MG tablet  Commonly known as:  ZOCOR  Take 1 tablet (10 mg total) by mouth every evening.     valsartan-hydrochlorothiazide 320-25 MG per tablet  Commonly known as:  DIOVAN-HCT  TAKE 1 TABLET EVERY MORNING FOR BLOOD PRESSURE       No Known Allergies  Past Medical History  Diagnosis Date  . Type II or unspecified type diabetes mellitus without mention of complication, not stated as uncontrolled   . Hypertension   . Hyperlipidemia   . Allergy   . Obesity    Past Surgical History  Procedure Laterality Date  . Cesarean section    . Umbilical hernia repair    . Tubal ligation     History  Substance Use Topics  . Smoking status: Never Smoker   . Smokeless tobacco: Not on file  . Alcohol Use: Not on file   Family History  Problem Relation Age of Onset  . Alcohol abuse Mother   . Hypertension Mother   . Cancer Father     throat  . Cancer Sister 43    breast  . Diabetes Son   . Diabetes Paternal Grandmother     MAINTENANCE: Colonoscopy:due now Mammo:1/ 2015 WNL at GYN BMD: will get at gyn Pap/ Pelvic:!/2015 WNL at GYN EYE: 2014 WNL Dentist:q 6 month  IMMUNIZATIONS: Tdap:08/2013 Influenza:  Patient Care Team: Unk Pinto, MD as PCP - General (Internal Medicine) Margarette Asal, MD as Consulting Physician (Obstetrics and Gynecology) Chow, (Dentist) [facility]  Review of Systems  Musculoskeletal: Positive for arthralgias.  All other systems reviewed and are negative.  BP 154/108  Pulse 68  Temp(Src) 98.2 F (36.8 C) (Temporal)  Resp 18  Ht 5' 5.5" (1.664 m)  Wt 316 lb (143.337 kg)  BMI 51.77 kg/m2  LMP 07/14/2013 Recheck 148/ 92    Objective:   Physical Exam  Nursing note and vitals reviewed. Constitutional: She is oriented to person, place, and time. She appears  well-developed and well-nourished. No distress.  Obese  HENT:  Head: Normocephalic and atraumatic.  Right Ear: External ear normal.  Left Ear: External ear normal.  Nose: Nose normal.  Mouth/Throat: Oropharynx is clear and moist. No oropharyngeal exudate.  Eyes: Conjunctivae and EOM are normal. Pupils are equal, round, and reactive to light. Right eye exhibits no discharge. Left eye exhibits no discharge. No scleral icterus.  Neck: Normal range of motion. Neck supple. No JVD present. No tracheal deviation present. No thyromegaly present.  Cardiovascular: Normal rate, regular rhythm, normal heart sounds and intact distal pulses.   Pulmonary/Chest: Effort normal and breath sounds normal.  Abdominal: Soft. Bowel sounds are normal. She exhibits no distension and no mass. There is no tenderness. There is no rebound and no guarding.  Genitourinary:  DEF GYN  Musculoskeletal: Normal range of motion. She exhibits no edema and no tenderness.  Decreased ROM Right shoulder with discomfort. Left hand dominant.   Lymphadenopathy:    She has no cervical adenopathy.  Neurological: She is alert and oriented to person, place, and time. She has normal reflexes. No cranial nerve deficit. She exhibits normal muscle tone. Coordination normal.  Skin: Skin is warm and dry. No rash noted. No erythema. No pallor.  Psychiatric: She has a normal mood and affect. Her behavior is normal. Judgment and thought content normal.      AORTA SCAN WNL EKG NSCSPT     Assessment &  Plan:  1. CPE- Update screening labs/ History/ Immunizations/ Testing as needed. Advised healthy diet, QD exercise, increase H20 and continue RX/ Vitamins AD.  2. 3 month F/U for Obesity, HTN, Cholesterol, DM, D. Deficient. Needs healthy diet, cardio QD and obtain healthy weight. Check Labs, Check BS if >200 or BP if >130/80 call office  Declines RX for HTN will recheck 2 weeks. May need Stress RX vs HTN RX.   3. Right shoulder pain-Stretches  explained, get xray to evaluate, may need referral to Ortho if no improvement

## 2013-08-20 ENCOUNTER — Encounter: Payer: Self-pay | Admitting: Gastroenterology

## 2013-08-20 ENCOUNTER — Other Ambulatory Visit: Payer: Self-pay | Admitting: Emergency Medicine

## 2013-08-20 ENCOUNTER — Other Ambulatory Visit: Payer: Self-pay | Admitting: Internal Medicine

## 2013-08-20 LAB — BASIC METABOLIC PANEL WITH GFR
BUN: 9 mg/dL (ref 6–23)
CALCIUM: 9 mg/dL (ref 8.4–10.5)
CO2: 27 mEq/L (ref 19–32)
CREATININE: 0.81 mg/dL (ref 0.50–1.10)
Chloride: 100 mEq/L (ref 96–112)
GFR, EST NON AFRICAN AMERICAN: 85 mL/min
Glucose, Bld: 95 mg/dL (ref 70–99)
POTASSIUM: 3.8 meq/L (ref 3.5–5.3)
SODIUM: 139 meq/L (ref 135–145)

## 2013-08-20 LAB — URINALYSIS, ROUTINE W REFLEX MICROSCOPIC
Bilirubin Urine: NEGATIVE
Glucose, UA: NEGATIVE mg/dL
HGB URINE DIPSTICK: NEGATIVE
Ketones, ur: NEGATIVE mg/dL
Leukocytes, UA: NEGATIVE
NITRITE: NEGATIVE
PH: 5.5 (ref 5.0–8.0)
Protein, ur: NEGATIVE mg/dL
Urobilinogen, UA: 0.2 mg/dL (ref 0.0–1.0)

## 2013-08-20 LAB — LIPID PANEL
CHOL/HDL RATIO: 3.7 ratio
CHOLESTEROL: 211 mg/dL — AB (ref 0–200)
HDL: 57 mg/dL (ref >39)
LDL Cholesterol: 129 mg/dL — ABNORMAL HIGH (ref 0–99)
TRIGLYCERIDES: 127 mg/dL (ref <150)
VLDL: 25 mg/dL (ref 0–40)

## 2013-08-20 LAB — MICROALBUMIN / CREATININE URINE RATIO
CREATININE, URINE: 53.6 mg/dL
MICROALB UR: 1.13 mg/dL (ref 0.00–1.89)
MICROALB/CREAT RATIO: 21.1 mg/g (ref 0.0–30.0)

## 2013-08-20 LAB — MAGNESIUM: Magnesium: 1.7 mg/dL (ref 1.5–2.5)

## 2013-08-20 LAB — INSULIN, FASTING: Insulin fasting, serum: 45 u[IU]/mL — ABNORMAL HIGH (ref 3–28)

## 2013-08-20 LAB — HEPATIC FUNCTION PANEL
ALBUMIN: 4.3 g/dL (ref 3.5–5.2)
ALT: 20 U/L (ref 0–35)
AST: 22 U/L (ref 0–37)
Alkaline Phosphatase: 36 U/L — ABNORMAL LOW (ref 39–117)
BILIRUBIN INDIRECT: 0.4 mg/dL (ref 0.2–1.2)
Bilirubin, Direct: 0.1 mg/dL (ref 0.0–0.3)
Total Bilirubin: 0.5 mg/dL (ref 0.2–1.2)
Total Protein: 6.7 g/dL (ref 6.0–8.3)

## 2013-08-20 LAB — VITAMIN D 25 HYDROXY (VIT D DEFICIENCY, FRACTURES): Vit D, 25-Hydroxy: 29 ng/mL — ABNORMAL LOW (ref 30–89)

## 2013-08-20 LAB — TSH: TSH: 2.896 u[IU]/mL (ref 0.350–4.500)

## 2013-08-21 LAB — TB SKIN TEST
Induration: 0 mm
TB Skin Test: NEGATIVE

## 2013-09-03 ENCOUNTER — Encounter: Payer: Self-pay | Admitting: Emergency Medicine

## 2013-09-03 ENCOUNTER — Ambulatory Visit (INDEPENDENT_AMBULATORY_CARE_PROVIDER_SITE_OTHER): Payer: 59 | Admitting: Emergency Medicine

## 2013-09-03 VITALS — BP 158/98 | HR 86 | Temp 98.0°F | Resp 18 | Ht 65.5 in | Wt 316.0 lb

## 2013-09-03 DIAGNOSIS — I10 Essential (primary) hypertension: Secondary | ICD-10-CM

## 2013-09-03 MED ORDER — DILTIAZEM HCL ER 120 MG PO CP12: 120.0000 mg | ORAL_CAPSULE | Freq: Two times a day (BID) | ORAL | Status: DC

## 2013-09-03 NOTE — Patient Instructions (Signed)
Sodium (and Fluid Restriction- do not do this part) Some health conditions may require you to restrict your sodium and fluid intake. Sodium is part of the salt in the blood. Sodium may be restricted because when you take in a lot of salt, you become thirsty. Limiting salt with help you become less thirsty and may make it easier to restrict fluid. Talk to your caregiver or dietician about how many cups of fluid and how many milligrams of sodium you are allowed each day. If your caregiver has restricted your sodium and fluids, usually the amount you can drink depends on several things, such as:  Your urine output.  How much fluid you are retaining.  Your blood pressure. Every 2 cups (500 mL) of fluid retained in the body becomes an extra 1 pound (0.5 kg) of body weight. The following are examples of some fluids you will have to restrict:  Tea, coffee, soda, lemonade, milk, water, and juice.  Alcoholic beverages.  Cream.  Gravy.  Ice cubes.  Soup and broth. The following are foods that become liquid at room temperature. These foods will count towards your fluid intake.  Ice cream and ice milk.  Frozen yogurt and sherbet.  Frozen ice pops.  Flavored gelatin. YOU MAY BE TAKING IN TOO MUCH FLUID IF:  Your weight increases.  Your face, hands, legs, feet, and abdomen start to swell.  You have trouble breathing. HOME CARE INSTRUCTIONS If you follow a low sodium diet closely, you will eat approximately 1,500 mg of sodium a day.   Avoid salty foods. This increases your thirst and makes fluid control more difficult. Foods high in sodium include:  Most canned foods, including most meats.  Most processed foods, including most meats.  Cheese.  Dried pasta and rice mixes.  Snack foods (chips, popcorn, pretzels, cheese puffs, salted nuts).  Dips, sauces, and salad dressings.  Do not use salt in cooking or add salt to your meal. Lacinda Axon with herbs and spices, but not those that have  salt in the name. Ask your caregiver if it is okay to use salt substitutes.  Eat home-prepared meals. Use fresh ingredients. Avoid canned, frozen, or packaged meals.  Read food labels to see how much sodium is in the food. Know how much sodium you are allowed each day.  When eating out, ask for dressings and sauces on the side.  Weigh yourself every morning with an empty bladder before you eat or drink. If your weight is going up, you are retaining fluid.  Freeze fruit juice or water in an ice cube tray. Use this as part of your fluid allowance.  Brush your teeth often or rinse your mouth with mouthwash to help your dry mouth. Lemon wedges, hard sour candies, chewing gum, or breath spray may help to moisten your mouth too.  Add a slice of fresh lemon or lemon juice to water or ice. This helps satisfy your thirst.  Try frozen fruits between meals, such as grapes or strawberries.  Swallow your pills along with meals or soft foods. This helps you save your fluid for something you enjoy.  Use small cups and glasses and learn to sip fluids slowly.  Keep your home cooler. Keep the air in your home as humid as possible. Dry air increases thirst.  Avoid being out in the hot sun. Each morning, fill a jug with the amount of water you are allowed for the day. You can use this water as a guideline for fluid allowance.  Each time you take in fluid, pour an equal amount of water out of the container. This helps you to see how much fluid you are taking in. It also helps plan your fluid intake for the rest of the day. CONVERSIONS TO HELP MEASURE FLUID INTAKE  1 cup equals 8 oz (240 mL).   cup equals 6 oz (180 mL).   cup equals 5  oz (160 mL).   cup equals 4 oz (120 mL).   cup equals 2  oz (80 mL).   cup equals 2 oz (60 mL).  2 tbs equals 1 oz (30 mL). Document Released: 12/24/2006 Document Revised: 08/28/2011 Document Reviewed: 08/10/2011 [facility] Patient Information 2015 [place], [place].  This information is not intended to replace advice given to you by your health care Marlis Oldaker. Make sure you discuss any questions you have with your health care Elmo Shumard.

## 2013-09-03 NOTE — Progress Notes (Signed)
   Subjective:    Patient ID: [name], female    DOB: 1960/03/26, 53 y.o.   MRN: [id number]  HPI Comments: 53 YO AAF for close recheck of BP. She notes BP 160s at home with mild HA on/ off. She has not changed exercise or diet AD.  Hypertension Associated symptoms include headaches.      Medication List       This list is accurate as of: 09/03/13 11:33 AM.  Always use your most recent med list.               albuterol 108 (90 BASE) MCG/ACT inhaler  Commonly known as:  PROVENTIL HFA;VENTOLIN HFA  Inhale 2 puffs into the lungs every 6 (six) hours as needed for wheezing or shortness of breath.     Azelastine-Fluticasone 137-50 MCG/ACT Susp  Place into the nose.     cetirizine 10 MG tablet  Commonly known as:  ZYRTEC  Take 10 mg by mouth daily as needed.     ergocalciferol 50000 UNITS capsule  Commonly known as:  VITAMIN D2  Take 50,000 Units by mouth once a week.     metFORMIN 500 MG 24 hr tablet  Commonly known as:  GLUCOPHAGE-XR  Take 500 mg by mouth daily.     NASONEX 50 MCG/ACT nasal spray  Generic drug:  mometasone  Place 2 sprays into the nose as needed.     ranitidine 75 MG tablet  Commonly known as:  ZANTAC  Take 75 mg by mouth 2 (two) times daily.     simvastatin 10 MG tablet  Commonly known as:  ZOCOR  TAKE 1 TABLET BY MOUTH ONCE DAILY     valsartan-hydrochlorothiazide 320-25 MG per tablet  Commonly known as:  DIOVAN-HCT  TAKE 1 TABLET EVERY MORNING FOR BLOOD PRESSURE       No Known Allergies  Past Medical History  Diagnosis Date  . Type II or unspecified type diabetes mellitus without mention of complication, not stated as uncontrolled   . Hypertension   . Hyperlipidemia   . Allergy   . Obesity      Review of Systems  Neurological: Positive for headaches.  All other systems reviewed and are negative.  BP 158/98  Pulse 86  Temp(Src) 98 F (36.7 C) (Temporal)  Resp 18  Ht 5' 5.5" (1.664 m)  Wt 316 lb (143.337 kg)  BMI 51.77  kg/m2  LMP 08/14/2013     Objective:   Physical Exam  Nursing note and vitals reviewed. Constitutional: She appears well-nourished.  obese  Cardiovascular: Normal rate, regular rhythm, normal heart sounds and intact distal pulses.   Pulmonary/Chest: Effort normal and breath sounds normal.  Musculoskeletal: Normal range of motion.  Neurological: She is alert.  Skin: Skin is warm and dry.  Psychiatric: Judgment normal.          Assessment & Plan:  1. HTN- Check BP call if >130/80, increase cardio Add Diltiazem sr 120 start 1 QD x 1 week if no change increase to BID #21, recheck 2 weeks NV with BMP, w/c if SX increase or ER. Get Renal u/s to evaluate for stenosis

## 2013-09-17 ENCOUNTER — Ambulatory Visit (INDEPENDENT_AMBULATORY_CARE_PROVIDER_SITE_OTHER): Payer: 59 | Admitting: *Deleted

## 2013-09-17 ENCOUNTER — Other Ambulatory Visit: Payer: Self-pay

## 2013-09-17 VITALS — BP 162/104 | HR 80

## 2013-09-17 DIAGNOSIS — Z79899 Other long term (current) drug therapy: Secondary | ICD-10-CM

## 2013-09-17 DIAGNOSIS — I10 Essential (primary) hypertension: Secondary | ICD-10-CM

## 2013-09-17 DIAGNOSIS — O169 Unspecified maternal hypertension, unspecified trimester: Secondary | ICD-10-CM

## 2013-09-17 LAB — BASIC METABOLIC PANEL WITH GFR
BUN: 11 mg/dL (ref 6–23)
CALCIUM: 9.2 mg/dL (ref 8.4–10.5)
CO2: 29 meq/L (ref 19–32)
CREATININE: 0.83 mg/dL (ref 0.50–1.10)
Chloride: 99 mEq/L (ref 96–112)
GFR, EST NON AFRICAN AMERICAN: 82 mL/min
GFR, Est African American: >89 mL/min
GLUCOSE: 101 mg/dL — AB (ref 70–99)
POTASSIUM: 3.8 meq/L (ref 3.5–5.3)
SODIUM: 138 meq/L (ref 135–145)

## 2013-09-17 NOTE — Progress Notes (Signed)
Patient ID: [name], female   DOB: [birth year], 53 y.o.   MRN: [id number] Patient presents today for a 2 week NV to recheck BP and BMP.  Patient was advised at last ov to Check BP call if >130/80, increase cardio Add Diltiazem sr 120 start 1 QD x 1 week if no change increase to BID #21, recheck 2 weeks NV with BMP, w/c if SX increase or ER.  Patient states she was only taking Diltiazem 1 QD and did not increase to BID AD.  Patient states BP still running high at 184/102 this morning and 162/104 in office today.  Patient was advised to increase Diltiazem to BID and a renal u/s will be sched to r/o stenosis.

## 2013-09-25 ENCOUNTER — Ambulatory Visit
Admission: RE | Admit: 2013-09-25 | Discharge: 2013-09-25 | Disposition: A | Discharge status: Home / Self Care | Payer: Managed Care, Other (non HMO) | Source: Ambulatory Visit | Attending: Emergency Medicine | Admitting: Emergency Medicine

## 2013-09-25 DIAGNOSIS — I10 Essential (primary) hypertension: Secondary | ICD-10-CM

## 2013-10-19 ENCOUNTER — Telehealth: Payer: Self-pay

## 2013-10-19 NOTE — Telephone Encounter (Signed)
Pt scheduled for PV tomorrow on 10/20/13.  If BMI still over 50 will need hospital appt for prop colon with MS.  Thank you.  Will up-date tomorrow.

## 2013-10-20 NOTE — Telephone Encounter (Signed)
Talked with pt  She wants MS's nurse to set up hospital case for colonoscopy and then call her back with appt for procedure and previsit.  Her weight has not changed.  May have increased somewhat.  Will forward to Barb Merino RN.

## 2013-10-20 NOTE — Telephone Encounter (Signed)
Procedure scheduled for 12/15/13 at 9:30am. Nurse visit scheduled for 12/10/13. Told patient I will mail her instructions so she can go ahead and go over them before the nurse visit and ask any questions she has at the nurse visit. Pt agreed and verbalized understanding.

## 2013-11-03 ENCOUNTER — Encounter: Payer: Self-pay | Admitting: Gastroenterology

## 2013-11-25 ENCOUNTER — Ambulatory Visit: Payer: Self-pay | Admitting: Emergency Medicine

## 2013-11-26 ENCOUNTER — Encounter (HOSPITAL_COMMUNITY): Payer: Self-pay | Admitting: Pharmacy Technician

## 2013-12-07 ENCOUNTER — Encounter (HOSPITAL_COMMUNITY): Payer: Self-pay | Admitting: *Deleted

## 2013-12-10 ENCOUNTER — Other Ambulatory Visit: Payer: Self-pay | Admitting: Gastroenterology

## 2013-12-10 ENCOUNTER — Ambulatory Visit (AMBULATORY_SURGERY_CENTER): Payer: Self-pay | Admitting: *Deleted

## 2013-12-10 ENCOUNTER — Telehealth: Payer: Self-pay | Admitting: *Deleted

## 2013-12-10 VITALS — Ht 65.5 in | Wt 316.0 lb

## 2013-12-10 DIAGNOSIS — Z1211 Encounter for screening for malignant neoplasm of colon: Secondary | ICD-10-CM

## 2013-12-10 MED ORDER — MOVIPREP 100 G PO SOLR
1.0000 | Freq: Once | ORAL | Status: DC
Start: 2013-12-10 — End: 2014-06-17

## 2013-12-10 NOTE — Telephone Encounter (Signed)
I saw this pt in PV today.  Pt is scheduled for procedure at [facility] on 12-15-13 tuesday.  This is your reminder to enter orders for pt.  Thanks.  Marie pre visit

## 2013-12-10 NOTE — Progress Notes (Signed)
No home 02 use. ewm  no egg or soy allergy. ewm No diet pills. ewm emmi video to pt's e mail. ewm No problems with past sedation. ewm Pt's procedure at [facility] long. ewm

## 2013-12-10 NOTE — Telephone Encounter (Signed)
Will put hospital orders in.

## 2013-12-15 ENCOUNTER — Ambulatory Visit (HOSPITAL_COMMUNITY): Payer: Managed Care, Other (non HMO) | Admitting: Anesthesiology

## 2013-12-15 ENCOUNTER — Encounter (HOSPITAL_COMMUNITY): Payer: Managed Care, Other (non HMO) | Admitting: Anesthesiology

## 2013-12-15 ENCOUNTER — Encounter (HOSPITAL_COMMUNITY)
Admission: RE | Disposition: A | Discharge status: Home / Self Care | Payer: Self-pay | Source: Ambulatory Visit | Attending: Gastroenterology

## 2013-12-15 ENCOUNTER — Encounter (HOSPITAL_COMMUNITY): Payer: Self-pay | Admitting: Anesthesiology

## 2013-12-15 ENCOUNTER — Ambulatory Visit (HOSPITAL_COMMUNITY)
Admission: RE | Admit: 2013-12-15 | Discharge: 2013-12-15 | Disposition: A | Discharge status: Home / Self Care | Payer: Managed Care, Other (non HMO) | Source: Ambulatory Visit | Attending: Gastroenterology | Admitting: Gastroenterology

## 2013-12-15 DIAGNOSIS — E785 Hyperlipidemia, unspecified: Secondary | ICD-10-CM | POA: Insufficient documentation

## 2013-12-15 DIAGNOSIS — I10 Essential (primary) hypertension: Secondary | ICD-10-CM | POA: Insufficient documentation

## 2013-12-15 DIAGNOSIS — Z1211 Encounter for screening for malignant neoplasm of colon: Secondary | ICD-10-CM | POA: Insufficient documentation

## 2013-12-15 DIAGNOSIS — Z860100 Personal history of colon polyps, unspecified: Secondary | ICD-10-CM

## 2013-12-15 DIAGNOSIS — K621 Rectal polyp: Secondary | ICD-10-CM

## 2013-12-15 DIAGNOSIS — D128 Benign neoplasm of rectum: Secondary | ICD-10-CM | POA: Insufficient documentation

## 2013-12-15 HISTORY — PX: COLONOSCOPY WITH PROPOFOL: SHX5780

## 2013-12-15 LAB — GLUCOSE, CAPILLARY: GLUCOSE-CAPILLARY: 90 mg/dL (ref 70–99)

## 2013-12-15 SURGERY — COLONOSCOPY WITH PROPOFOL
Anesthesia: Monitor Anesthesia Care

## 2013-12-15 MED ORDER — SODIUM CHLORIDE 0.9 % IV SOLN: INTRAVENOUS | Status: DC

## 2013-12-15 MED ORDER — LACTATED RINGERS IV SOLN
INTRAVENOUS | Status: DC | PRN
  Administered 2013-12-15: 09:00:00 via INTRAVENOUS

## 2013-12-15 MED ORDER — LACTATED RINGERS IV SOLN
INTRAVENOUS | Status: DC
  Administered 2013-12-15: 09:00:00 via INTRAVENOUS

## 2013-12-15 MED ORDER — PROPOFOL INFUSION 10 MG/ML OPTIME
INTRAVENOUS | Status: DC | PRN
  Administered 2013-12-15: 90 ug/kg/min via INTRAVENOUS

## 2013-12-15 MED ORDER — PROPOFOL 10 MG/ML IV BOLUS
INTRAVENOUS | Status: AC
  Filled 2013-12-15: qty 20

## 2013-12-15 SURGICAL SUPPLY — 21 items

## 2013-12-15 NOTE — Discharge Instructions (Signed)
Colonoscopy, Care After °These instructions give you information on caring for yourself after your procedure. Your doctor may also give you more specific instructions. Call your doctor if you have any problems or questions after your procedure. °HOME CARE °· Do not drive for 24 hours. °· Do not sign important papers or use machinery for 24 hours. °· You may shower. °· You may go back to your usual activities, but go slower for the first 24 hours. °· Take rest breaks often during the first 24 hours. °· Walk around or use warm packs on your belly (abdomen) if you have belly cramping or gas. °· Drink enough fluids to keep your pee (urine) clear or pale yellow. °· Resume your normal diet. Avoid heavy or fried foods. °· Avoid drinking alcohol for 24 hours or as told by your doctor. °· Only take medicines as told by your doctor. °If a tissue sample (biopsy) was taken during the procedure:  °· Do not take aspirin or blood thinners for 7 days, or as told by your doctor. °· Do not drink alcohol for 7 days, or as told by your doctor. °· Eat soft foods for the first 24 hours. °GET HELP IF: °You still have a small amount of blood in your poop (stool) 2-3 days after the procedure. °GET HELP RIGHT AWAY IF: °· You have more than a small amount of blood in your poop. °· You see clumps of tissue (blood clots) in your poop. °· Your belly is puffy (swollen). °· You feel sick to your stomach (nauseous) or throw up (vomit). °· You have a fever. °· You have belly pain that gets worse and medicine does not help. °MAKE SURE YOU: °· Understand these instructions. °· Will watch your condition. °· Will get help right away if you are not doing well or get worse. °Document Released: 03/31/2010 Document Revised: 03/03/2013 Document Reviewed: 11/03/2012 °[employer]® Patient Information ©2015 [employer]. This information is not intended to replace advice given to you by your health care provider. Make sure you discuss any questions you have with  your health care provider. ° °

## 2013-12-15 NOTE — Anesthesia Postprocedure Evaluation (Signed)
  Anesthesia Post-op Note  Patient: [name]  Procedure(s) Performed: Procedure(s) (LRB): COLONOSCOPY WITH PROPOFOL (N/A)  Patient Location: PACU  Anesthesia Type: MAC  Level of Consciousness: awake and alert   Airway and Oxygen Therapy: Patient Spontanous Breathing  Post-op Pain: mild  Post-op Assessment: Post-op Vital signs reviewed, Patient's Cardiovascular Status Stable, Respiratory Function Stable, Patent Airway and No signs of Nausea or vomiting  Last Vitals:  Filed Vitals:   12/15/13 1050  BP: 145/101  Pulse: 61  Temp:   Resp: 13    Post-op Vital Signs: stable   Complications: No apparent anesthesia complications

## 2013-12-15 NOTE — Progress Notes (Signed)
Pt awake, alert, in NAD, denies pain, tolerating POs. Waiting for Dr. Fuller Plan to dispo pt prior to discharge.

## 2013-12-15 NOTE — Progress Notes (Signed)
Dr. Fuller Plan notified at 1140 that pt waiting for dispo.

## 2013-12-15 NOTE — Op Note (Addendum)
[facility] [place], [id number]   COLONOSCOPY PROCEDURE REPORT  PATIENT: Catherine, Frank  MR#: [id number] BIRTHDATE: November 24, 1960 , 52  yrs. old GENDER: female ENDOSCOPIST: Ladene Artist, MD, [facility] REFERRED LF:YBOFB, Melissa PROCEDURE DATE:  12/15/2013 PROCEDURE:   Colonoscopy with snare polypectomy First Screening Colonoscopy - Avg.  risk and is 50 yrs.  old or older Yes.  Prior Negative Screening - Now for repeat screening. N/A  History of Adenoma - Now for follow-up colonoscopy & has been > or = to 3 yrs.  N/A  Polyps Removed Today? Yes. ASA CLASS:   Class II INDICATIONS:average risk for colorectal cancer. MEDICATIONS: Monitored anesthesia care and Per Anesthesia DESCRIPTION OF PROCEDURE:   After the risks benefits and alternatives of the procedure were thoroughly explained, informed consent was obtained.  The digital rectal exam revealed no abnormalities of the rectum.   The Pentax Ped Colon [id number] endoscope was introduced through the anus and advanced to the cecum, which was identified by both the appendix and ileocecal valve. No adverse events experienced.   The quality of the prep was good, using MoviPrep  The instrument was then slowly withdrawn as the colon was fully examined.    COLON FINDINGS: Two sessile polyps measuring 5-6 mm in size were found in the rectum.  A polypectomy was performed with a cold snare.  The resection was complete, the polyp tissue was completely retrieved and sent to histology.   The examination was otherwise normal.  Retroflexed views revealed no abnormalities. The time to cecum=2 minutes 00 seconds.  Withdrawal time=13 minutes 00 seconds. The scope was withdrawn and the procedure completed. COMPLICATIONS: There were no immediate complications.  ENDOSCOPIC IMPRESSION: 1.   Two sessile polyps in the rectum; polypectomy performed with a cold snare 2.   The examination was otherwise  normal  RECOMMENDATIONS: 1.  Await pathology results 2.  Repeat colonoscopy in 5 years if polyp(s) adenomatous; otherwise 10 years  eSigned:  Ladene Artist, MD, [facility] 12/15/2013 10:22 AM Revised: 12/15/2013 10:22 AM

## 2013-12-15 NOTE — Anesthesia Preprocedure Evaluation (Signed)
Anesthesia Evaluation  Patient identified by MRN, date of birth, ID band Patient awake    Reviewed: Allergy & Precautions, H&P , NPO status , Patient's Chart, lab work & pertinent test results  Airway Mallampati: II TM Distance: >3 FB Neck ROM: Full    Dental no notable dental hx.    Pulmonary neg pulmonary ROS,  breath sounds clear to auscultation  Pulmonary exam normal       Cardiovascular hypertension, Pt. on medications Rhythm:Regular Rate:Normal     Neuro/Psych negative neurological ROS  negative psych ROS   GI/Hepatic negative GI ROS, Neg liver ROS,   Endo/Other  diabetes, Type 2, Oral Hypoglycemic AgentsMorbid obesity  Renal/GU negative Renal ROS  negative genitourinary   Musculoskeletal negative musculoskeletal ROS (+)   Abdominal (+) + obese,   Peds negative pediatric ROS (+)  Hematology negative hematology ROS (+)   Anesthesia Other Findings   Reproductive/Obstetrics negative OB ROS                           Anesthesia Physical Anesthesia Plan  ASA: III  Anesthesia Plan: MAC   Post-op Pain Management:    Induction: Intravenous  Airway Management Planned:   Additional Equipment:   Intra-op Plan:   Post-operative Plan:   Informed Consent: I have reviewed the patients History and Physical, chart, labs and discussed the procedure including the risks, benefits and alternatives for the proposed anesthesia with the patient or authorized representative who has indicated his/her understanding and acceptance.   Dental advisory given  Plan Discussed with: CRNA  Anesthesia Plan Comments:         Anesthesia Quick Evaluation

## 2013-12-15 NOTE — Transfer of Care (Signed)
Immediate Anesthesia Transfer of Care Note  Patient: [name]  Procedure(s) Performed: Procedure(s): COLONOSCOPY WITH PROPOFOL (N/A)  Patient Location: PACU  Anesthesia Type:MAC  Level of Consciousness: sedated  Airway & Oxygen Therapy: Patient Spontanous Breathing and Patient connected to face mask oxygen  Post-op Assessment: Report given to PACU RN and Post -op Vital signs reviewed and stable  Post vital signs: Reviewed and stable  Complications: No apparent anesthesia complications

## 2013-12-15 NOTE — H&P (Signed)
HPI Comments: 53 YO pleasant Obese AAF for CPE and presents for 3 month F/U for HTN, Cholesterol, Dm, D. Deficient. She did add MF for insulin resistance but has not lost a lot of weight. She notes BP was in 140/80s before stress increased.She is down 13# from last CPE. She is trying to lose weight. She is increasing activity. Here for CRC screening. She notes decreased ROM with Right shoulder. She notes difficulty going behind back. She denies injury or strain.   Hyperlipidemia  Hypertension    Medication List         This list is accurate as of: 08/19/13 11:59 PM. Always use your most recent med list.            albuterol 108 (90 BASE) MCG/ACT inhaler    Commonly known as: PROVENTIL HFA;VENTOLIN HFA    Inhale 2 puffs into the lungs every 6 (six) hours as needed for wheezing or shortness of breath.    Azelastine-Fluticasone 137-50 MCG/ACT Susp    Place into the nose.    cetirizine 10 MG tablet    Commonly known as: ZYRTEC    Take 10 mg by mouth daily as needed.    ergocalciferol 50000 UNITS capsule    Commonly known as: VITAMIN D2    Take 50,000 Units by mouth once a week.    metFORMIN 500 MG 24 hr tablet    Commonly known as: GLUCOPHAGE-XR    Take 500 mg by mouth daily.    NASONEX 50 MCG/ACT nasal spray    Generic drug: mometasone    Place 2 sprays into the nose as needed.    ranitidine 75 MG tablet    Commonly known as: ZANTAC    Take 75 mg by mouth 2 (two) times daily.    simvastatin 10 MG tablet    Commonly known as: ZOCOR    Take 1 tablet (10 mg total) by mouth every evening.    valsartan-hydrochlorothiazide 320-25 MG per tablet    Commonly known as: DIOVAN-HCT    TAKE 1 TABLET EVERY MORNING FOR BLOOD PRESSURE     No Known Allergies  Past Medical History   Diagnosis  Date   .  Type II or unspecified type diabetes mellitus without mention of complication, not stated as uncontrolled    .  Hypertension    .  Hyperlipidemia    .  Allergy    .  Obesity     Past  Surgical History   Procedure  Laterality  Date   .  Cesarean section     .  Umbilical hernia repair     .  Tubal ligation      History   Substance Use Topics   .  Smoking status:  Never Smoker   .  Smokeless tobacco:  Not on file   .  Alcohol Use:  Not on file    Family History   Problem  Relation  Age of Onset   .  Alcohol abuse  Mother    .  Hypertension  Mother    .  Cancer  Father      throat   .  Cancer  Sister  81     breast   .  Diabetes  Son    .  Diabetes  Paternal Grandmother     MAINTENANCE:  Colonoscopy:due now  Mammo:1/ 2015 WNL at GYN  BMD: will get at gyn  Pap/ Pelvic:!/2015 WNL at GYN  EYE: 2014 WNL  Dentist:q 6 month  IMMUNIZATIONS:  Tdap:08/2013  Influenza:  Patient Care Team:  Unk Pinto, MD as PCP - General (Internal Medicine)  Margarette Asal, MD as Consulting Physician (Obstetrics and Gynecology)  Donnamarie Poag, (Dentist)  [facility]  Review of Systems  Musculoskeletal: Positive for arthralgias.  All other systems reviewed and are negative.   BP 154/108  Pulse 68  Temp(Src) 98.2 F (36.8 C) (Temporal)  Resp 18  Ht 5' 5.5" (1.664 m)  Wt 316 lb (143.337 kg)  BMI 51.77 kg/m2  LMP 07/14/2013  Recheck 148/ 92  Objective:   Physical Exam  Nursing note and vitals reviewed.  Constitutional: She is oriented to person, place, and time. She appears well-developed and well-nourished. No distress.  Obese  HENT:  Head: Normocephalic and atraumatic.  Right Ear: External ear normal.  Left Ear: External ear normal.  Nose: Nose normal.  Mouth/Throat: Oropharynx is clear and moist. No oropharyngeal exudate.  Eyes: Conjunctivae and EOM are normal. Pupils are equal, round, and reactive to light. Right eye exhibits no discharge. Left eye exhibits no discharge. No scleral icterus.  Neck: Normal range of motion. Neck supple. No JVD present. No tracheal deviation present. No thyromegaly present.  Cardiovascular: Normal rate, regular rhythm, normal  heart sounds and intact distal pulses.  Pulmonary/Chest: Effort normal and breath sounds normal.  Abdominal: Soft. Bowel sounds are normal. She exhibits no distension and no mass. There is no tenderness. There is no rebound and no guarding.  Genitourinary:  DEF GYN  Musculoskeletal: Normal range of motion. She exhibits no edema and no tenderness.  Decreased ROM Right shoulder with discomfort. Left hand dominant.  Lymphadenopathy:  She has no cervical adenopathy.  Neurological: She is alert and oriented to person, place, and time. She has normal reflexes. No cranial nerve deficit. She exhibits normal muscle tone. Coordination normal.  Skin: Skin is warm and dry. No rash noted. No erythema. No pallor.  Psychiatric: She has a normal mood and affect. Her behavior is normal. Judgment and thought content normal.  AORTA SCAN WNL  EKG NSCSPT   Assessment & Plan:   1. CPE- Update screening labs/ History/ Immunizations/ Testing as needed. Advised healthy diet, QD exercise, increase H20 and continue RX/ Vitamins AD.  2. 3 month F/U for Obesity, HTN, Cholesterol, DM, D. Deficient. Needs healthy diet, cardio QD and obtain healthy weight. Check Labs, Check BS if >200 or BP if >130/80 call office Declines RX for HTN will recheck 2 weeks. May need Stress RX vs HTN RX.  3. Right shoulder pain-Stretches explained, get xray to evaluate, may need referral to Ortho if no improvement 4. CRC screening. Proceed with colonoscopy.

## 2013-12-16 ENCOUNTER — Encounter: Payer: Self-pay | Admitting: Gastroenterology

## 2013-12-16 ENCOUNTER — Encounter (HOSPITAL_COMMUNITY): Payer: Self-pay | Admitting: Gastroenterology

## 2014-04-22 ENCOUNTER — Other Ambulatory Visit: Payer: Self-pay | Admitting: Obstetrics and Gynecology

## 2014-04-23 LAB — CYTOLOGY - PAP

## 2014-05-04 ENCOUNTER — Ambulatory Visit: Payer: Self-pay | Admitting: Emergency Medicine

## 2014-05-11 ENCOUNTER — Ambulatory Visit (INDEPENDENT_AMBULATORY_CARE_PROVIDER_SITE_OTHER): Payer: 59 | Admitting: Physician Assistant

## 2014-05-11 ENCOUNTER — Encounter: Payer: Self-pay | Admitting: Physician Assistant

## 2014-05-11 VITALS — BP 132/80 | HR 80 | Temp 97.9°F | Resp 16 | Ht 65.5 in | Wt 323.0 lb

## 2014-05-11 DIAGNOSIS — E119 Type 2 diabetes mellitus without complications: Secondary | ICD-10-CM

## 2014-05-11 DIAGNOSIS — E669 Obesity, unspecified: Secondary | ICD-10-CM

## 2014-05-11 DIAGNOSIS — I10 Essential (primary) hypertension: Secondary | ICD-10-CM

## 2014-05-11 DIAGNOSIS — Z79899 Other long term (current) drug therapy: Secondary | ICD-10-CM | POA: Insufficient documentation

## 2014-05-11 DIAGNOSIS — M549 Dorsalgia, unspecified: Secondary | ICD-10-CM

## 2014-05-11 DIAGNOSIS — E785 Hyperlipidemia, unspecified: Secondary | ICD-10-CM

## 2014-05-11 LAB — CBC WITH DIFFERENTIAL/PLATELET
Basophils Absolute: 0 10*3/uL (ref 0.0–0.1)
Basophils Relative: 0 % (ref 0–1)
Eosinophils Absolute: 0.1 10*3/uL (ref 0.0–0.7)
Eosinophils Relative: 2 % (ref 0–5)
HEMATOCRIT: 39.8 % (ref 36.0–46.0)
Hemoglobin: 12.8 g/dL (ref 12.0–15.0)
LYMPHS ABS: 2.2 10*3/uL (ref 0.7–4.0)
LYMPHS PCT: 45 % (ref 12–46)
MCH: 28.1 pg (ref 26.0–34.0)
MCHC: 32.2 g/dL (ref 30.0–36.0)
MCV: 87.3 fL (ref 78.0–100.0)
MPV: 10 fL (ref 8.6–12.4)
Monocytes Absolute: 0.5 10*3/uL (ref 0.1–1.0)
Monocytes Relative: 10 % (ref 3–12)
Neutro Abs: 2.1 10*3/uL (ref 1.7–7.7)
Neutrophils Relative %: 43 % (ref 43–77)
Platelets: 320 10*3/uL (ref 150–400)
RBC: 4.56 MIL/uL (ref 3.87–5.11)
RDW: 14.9 % (ref 11.5–15.5)
WBC: 4.9 10*3/uL (ref 4.0–10.5)

## 2014-05-11 LAB — HEMOGLOBIN A1C
Hgb A1c MFr Bld: 6.6 % — ABNORMAL HIGH (ref <5.7)
Mean Plasma Glucose: 143 mg/dL — ABNORMAL HIGH (ref <117)

## 2014-05-11 MED ORDER — CYCLOBENZAPRINE HCL 10 MG PO TABS: 10.0000 mg | ORAL_TABLET | Freq: Three times a day (TID) | ORAL | Status: DC | PRN

## 2014-05-11 MED ORDER — MELOXICAM 15 MG PO TABS: ORAL_TABLET | ORAL | Status: DC

## 2014-05-11 NOTE — Progress Notes (Signed)
Assessment and Plan:  Hypertension: Continue medication, monitor blood pressure at home. Continue DASH diet.  Reminder to go to the ER if any CP, SOB, nausea, dizziness, severe HA, changes vision/speech, left arm numbness and tingling, and jaw pain. Cholesterol: Continue diet and exercise. Check cholesterol.  Pre-diabetes-Continue diet and exercise. Check A1C Vitamin D Def- check level and continue medications.  Obesity with co morbidities- long discussion about weight loss, diet, and exercise Lower back pain- negative straight leg Prednisone was not prescribed,NSAIDs, RICE, and exercise given If not better follow up in office or will refer to PT/orthopedics. Right bicep tendonitis without rupture-  exercises given, RICE, NSAIDS, if not better PT or ortho referral or injection next OV  Continue diet and meds as discussed. Further disposition pending results of labs. OVER 40 minutes of exam, counseling, chart review, referral performed  HPI 54 y.o. female  presents for 3 month follow up with hypertension, hyperlipidemia, prediabetes and vitamin D.  Her blood pressure has been controlled at home, today their BP is BP: 132/80 mmHg  She does not workout but is trying to park farther away and walk more. She denies chest pain, shortness of breath, dizziness.  She is on cholesterol medication and denies myalgias. Her cholesterol is not at goal. The cholesterol last visit was:  211, LDL of 129 Lab Results  Component Value Date   CHOL 211* 08/19/2013   HDL 57 08/19/2013   LDLCALC 129* 08/19/2013   TRIG 127 08/19/2013   CHOLHDL 3.7 08/19/2013    She has not been working on diet and exercise for prediabetes, and denies foot ulcerations, hyperglycemia, nausea, paresthesia of the feet, polydipsia, polyuria, visual disturbances and vomiting. Last A1C in the office was: 6.2 08/19/13 Lab Results  Component Value Date   HGBA1C 6.2* 08/19/2013   Patient is on Vitamin D supplement.   Lab Results   Component Value Date   VD25OH 29* 08/19/2013     BMI is Body mass index is 52.91 kg/(m^2)., she is working on diet and exercise. Wt Readings from Last 3 Encounters:  05/11/14 323 lb (146.512 kg)  12/15/13 315 lb (142.883 kg)  12/10/13 316 lb (143.337 kg)   Patient is also complaining of back pain.  She felt pain in her lower back after picking up her grandbaby and has since had a constant aching soreness.  It is worse with bending over and sitting for a long time.  Pain is not constant and does seem to be improving.  She sometimes has some radiation to the left leg which stops around the left buttock.  No saddle anesthesias, weakness, incontinence, denies urinary symptoms.    Patient also complaining of right shoulder pain that has been going on for approximately 1 month.  Pain is crampy.  No associated numbness or weakness.  Worse with laying on it.  Worse at night time.  Patient right hand dominant.    Hx of negative sleep study 10 yrs ago.  Does snore per family reports.  Current Medications:  Current Outpatient Prescriptions on File Prior to Visit  Medication Sig Dispense Refill  . albuterol (PROVENTIL HFA;VENTOLIN HFA) 108 (90 BASE) MCG/ACT inhaler Inhale 2 puffs into the lungs every 6 (six) hours as needed for wheezing or shortness of breath. 1 Inhaler 2  . Azelastine-Fluticasone 137-50 MCG/ACT SUSP Place 1 spray into the nose daily as needed (allergies).     . cetirizine (ZYRTEC) 10 MG tablet Take 10 mg by mouth daily as needed for allergies.     Marland Kitchen  ergocalciferol (VITAMIN D2) 50000 UNITS capsule Take 50,000 Units by mouth 3 (three) times a week.     Marland Kitchen ibuprofen (ADVIL,MOTRIN) 200 MG tablet Take 400 mg by mouth once as needed for headache or moderate pain.    . metFORMIN (GLUCOPHAGE-XR) 500 MG 24 hr tablet Take 500 mg by mouth every morning.     . mometasone (NASONEX) 50 MCG/ACT nasal spray Place 2 sprays into the nose as needed.    Marland Kitchen MOVIPREP 100 G SOLR Take 1 kit (200 g total) by  mouth once. moviprep as directed. No substitutions 1 kit 0  . ranitidine (ZANTAC) 75 MG tablet Take 75 mg by mouth once as needed for heartburn.     . simvastatin (ZOCOR) 10 MG tablet Take 10 mg by mouth at bedtime.    . valsartan-hydrochlorothiazide (DIOVAN-HCT) 320-25 MG per tablet Take 1 tablet by mouth every morning.     No current facility-administered medications on file prior to visit.   Medical History:  Past Medical History  Diagnosis Date  . Type II or unspecified type diabetes mellitus without mention of complication, not stated as uncontrolled   . Hypertension   . Hyperlipidemia   . Allergy   . Obesity    Allergies: No Known Allergies   Review of Systems:  Review of Systems  Constitutional: Positive for malaise/fatigue. Negative for fever, chills, weight loss and diaphoresis.  HENT: Negative.   Respiratory: Negative.   Cardiovascular: Negative.   Gastrointestinal: Negative.   Genitourinary: Negative for dysuria, urgency, frequency, hematuria and flank pain.  Musculoskeletal: Positive for myalgias, back pain and joint pain. Negative for falls and neck pain.  Skin: Negative.   Neurological: Negative for weakness.  Psychiatric/Behavioral: Negative.    Family history- Review and unchanged Social history- Review and unchanged Physical Exam: BP 132/80 mmHg  Pulse 80  Temp(Src) 97.9 F (36.6 C)  Resp 16  Ht 5' 5.5" (1.664 m)  Wt 323 lb (146.512 kg)  BMI 52.91 kg/m2 Wt Readings from Last 3 Encounters:  05/11/14 323 lb (146.512 kg)  12/15/13 315 lb (142.883 kg)  12/10/13 316 lb (143.337 kg)   General Appearance: Well nourished, in no apparent distress. Eyes: PERRLA, EOMs, conjunctiva no swelling or erythema Sinuses: No Frontal/maxillary tenderness ENT/Mouth: Ext aud canals clear, TMs without erythema, bulging. No erythema, swelling, or exudate on post pharynx.  Tonsils not swollen or erythematous. Hearing normal.  Neck: Supple, thyroid normal.  Respiratory:  Respiratory effort normal, decreased breath sounds due to body habitus, BS equal bilaterally without rales, rhonchi, wheezing or stridor.  Cardio: RRR, decreased HR sounds due to body habitus, with no MRGs. Brisk peripheral pulses with 2+ edema.  Abdomen: Soft, + BS, morbidly obese, Non tender, no guarding, rebound, hernias, masses. Lymphatics: Non tender without lymphadenopathy.  Musculoskeletal: Full ROM, 5/5 strength, antalgic gait due to body habitus, Patient is able to ambulate well.   Gait is  antalgic. Straight leg raising with dorsiflexion negative bilaterally for radicular symptoms. Sensory exam in the legs are normal.  Knee reflexes are normal Ankle reflexes are normal Strength is normal and symmetric in arms and legs. There is not SI tenderness to palpation.  There isparaspinal muscle spasm.   There is not midline tenderness.   ROM of spine with normal flexion, extension, lateral range of motion to the right and left, and rotation to the right and left.   Right normal active ROM, no tenderness, no impingement sign and + biceps tendon pain  Strength is normal and  symmetric in arms. Skin: Warm, dry without rashes, lesions, ecchymosis.  Neuro: Cranial nerves intact. Normal muscle tone, no cerebellar symptoms. Psych: Awake and oriented X 3, normal affect, Insight and Judgment appropriate.    Vicie Mutters, PA-C 11:30 AM [facility]

## 2014-05-11 NOTE — Patient Instructions (Signed)
Back Pain, Adult Low back pain is very common. About 1 in 5 people have back pain.The cause of low back pain is rarely dangerous. The pain often gets better over time.About half of people with a sudden onset of back pain feel better in just 2 weeks. About 8 in 10 people feel better by 6 weeks.  CAUSES Some common causes of back pain include: 1. Strain of the muscles or ligaments supporting the spine. 2. Wear and tear (degeneration) of the spinal discs. 3. Arthritis. 4. Direct injury to the back. DIAGNOSIS Most of the time, the direct cause of low back pain is not known.However, back pain can be treated effectively even when the exact cause of the pain is unknown.Answering your caregiver's questions about your overall health and symptoms is one of the most accurate ways to make sure the cause of your pain is not dangerous. If your caregiver needs more information, he or she may order lab work or imaging tests (X-rays or MRIs).However, even if imaging tests show changes in your back, this usually does not require surgery. HOME CARE INSTRUCTIONS For many people, back pain returns.Since low back pain is rarely dangerous, it is often a condition that people can learn to [facility] their own.   Remain active. It is stressful on the back to sit or stand in one place. Do not sit, drive, or stand in one place for more than 30 minutes at a time. Take short walks on level surfaces as soon as pain allows.Try to increase the length of time you walk each day.  Do not stay in bed.Resting more than 1 or 2 days can delay your recovery.  Do not avoid exercise or work.Your body is made to move.It is not dangerous to be active, even though your back may hurt.Your back will likely heal faster if you return to being active before your pain is gone.  Pay attention to your body when you bend and lift. Many people have less discomfortwhen lifting if they bend their knees, keep the load close to their  bodies,and avoid twisting. Often, the most comfortable positions are those that put less stress on your recovering back.  Find a comfortable position to sleep. Use a firm mattress and lie on your side with your knees slightly bent. If you lie on your back, put a pillow under your knees.  Only take over-the-counter or prescription medicines as directed by your caregiver. Over-the-counter medicines to reduce pain and inflammation are often the most helpful.Your caregiver may prescribe muscle relaxant drugs.These medicines help dull your pain so you can more quickly return to your normal activities and healthy exercise.  Put ice on the injured area.  Put ice in a plastic bag.  Place a towel between your skin and the bag.  Leave the ice on for 15-20 minutes, 03-04 times a day for the first 2 to 3 days. After that, ice and heat may be alternated to reduce pain and spasms.  Ask your caregiver about trying back exercises and gentle massage. This may be of some benefit.  Avoid feeling anxious or stressed.Stress increases muscle tension and can worsen back pain.It is important to recognize when you are anxious or stressed and learn ways to manage it.Exercise is a great option. SEEK MEDICAL CARE IF:  You have pain that is not relieved with rest or medicine.  You have pain that does not improve in 1 week.  You have new symptoms.  You are generally not feeling well. SEEK  IMMEDIATE MEDICAL CARE IF:   You have pain that radiates from your back into your legs.  You develop new bowel or bladder control problems.  You have unusual weakness or numbness in your arms or legs.  You develop nausea or vomiting.  You develop abdominal pain.  You feel faint. Document Released: 02/26/2005 Document Revised: 08/28/2011 Document Reviewed: 06/30/2013 [facility] Patient Information 2015 [place], [place]. This information is not intended to replace advice given to you by your health care provider. Make  sure you discuss any questions you have with your health care provider.   Biceps Tendon Tendinitis (Proximal) and Tenosynovitis with Rehab Tendonitis and tenosynovitis involve inflammation of the tendon and the tendon lining (sheath). The proximal biceps tendon is vulnerable to tendonitis and tenosynovitis, which causes pain and discomfort in the front of the shoulder and upper arm. The tendon lining secretes a fluid that helps lubricate the tendon, allowing for proper function without pain. When the tendon and its lining become inflamed, the tendon can no longer glide smoothly, causing pain. The proximal biceps tendon connects the biceps muscle to two bones of the shoulder. It is important for proper function of the elbow and turning the palm upward (supination) using the wrist. Proximal biceps tendon tendinitis may include a grade 1 or 2 strain of the tendon. Grade 1 strains involve a slight pull of the tendon without signs of tearing and no observed tendon lengthening. There is also no loss of strength. Grade 2 strains involve small tears in the tendon fibers. The tendon or muscle is stretched and strength is usually decreased.  SYMPTOMS  5. Pain, tenderness, swelling, warmth, or redness over the front of the shoulder. 6. Pain that gets worse with shoulder and elbow use, especially against resistance. 7. Limited motion of the shoulder or elbow. 8. Crackling sound (crepitation) when the tendon or shoulder is moved or touched. CAUSES  The symptoms of biceps tendonitis are due to inflammation of the tendon. Inflammation may be caused by:  Strain from sudden increase in amount or intensity of activity.  Direct blow or injury to the elbow (uncommon).  Overuse or repetitive elbow bending or wrist rotation, particularly when turning the palm up, or with elbow hyperextension. RISK INCREASES WITH:  Sports that involve contact or overhead arm activity (throwing sports, gymnastics, weightlifting,  bodybuilding, rock climbing).  Heavy labor.  Poor strength and flexibility.  Failure to warm up properly before activity. PREVENTION  Warm up and stretch properly before activity.  Allow time for recovery between activities.  Maintain physical fitness:  Strength, flexibility, and endurance.  Cardiovascular fitness.  Learn and use proper exercise technique. PROGNOSIS  With proper treatment, proximal biceps tendon tendonitis and tenosynovitis is usually curable within 6 weeks. Healing is usually quicker if the cause was a direct blow, not overuse.  RELATED COMPLICATIONS   Longer healing time if not properly treated or if not given enough time to heal.  Chronically inflamed tendon that causes persistent pain with activity, that may progress to constant pain and potentially rupture of the tendon.  Recurring symptoms, especially if activity is resumed too soon or with overuse, a direct blow, or use of poor exercise technique. TREATMENT Treatment first involves ice and medicine, to reduce pain and inflammation. It is helpful to modify activities that cause pain, to reduce the chances of causing the condition to get worse. Strengthening and stretching exercises should be performed to promote proper use of the muscles of the shoulder. These exercises may be performed  at home or with a therapist. Other treatments may be given such as ultrasound or heat therapy. A corticosteroid injection may be recommended to help reduce inflammation of the tendon lining. Surgery is usually not necessary. Sometimes, if symptoms last for greater than 6 months, surgery will be advised to detach the tendon and re-insert it into the arm bone. Surgery to correct other shoulder problems that may be contributing to tendinitis may be advised before surgery for the tendinitis itself.  MEDICATION  If pain medicine is needed, nonsteroidal anti-inflammatory medicines (aspirin and ibuprofen), or other minor pain relievers  (acetaminophen), are often advised.  Do not take pain medicine for 7 days before surgery.  Prescription pain relievers may be given if your caregiver thinks they are needed. Use only as directed and only as much as you need.  Corticosteroid injections may be given. These injections should only be used on the most severe cases, as one can only receive a limited number of them. HEAT AND COLD   Cold treatment (icing) should be applied for 10 to 15 minutes every 2 to 3 hours for inflammation and pain, and immediately after activity that aggravates your symptoms. Use ice packs or an ice massage.  Heat treatment may be used before performing stretching and strengthening activities prescribed by your caregiver, physical therapist, or athletic trainer. Use a heat pack or a warm water soak. SEEK MEDICAL CARE IF:   Symptoms get worse or do not improve in 2 weeks, despite treatment.  New, unexplained symptoms develop. (Drugs used in treatment may produce side effects.) EXERCISES RANGE OF MOTION (ROM) AND EXERCISES - Biceps Tendon (Proximal) and Tenosynovitis These exercises may help you when beginning to rehabilitate your injury. Your symptoms may go away with or without further involvement from your physician, physical therapist, or athletic trainer. While completing these exercises, remember:   Restoring tissue flexibility helps normal motion to return to the joints. This allows healthier, less painful movement and activity.  An effective stretch should be held for at least 30 seconds.  A stretch should never be painful. You should only feel a gentle lengthening or release in the stretched tissue. STRETCH - Flexion, Standing  Stand with good posture. With an underhand grip on your right / left hand and an overhand grip on the opposite hand, grasp a broomstick or cane so that your hands are a little more than shoulder width apart.  Keeping your right / left elbow straight and shoulder muscles  relaxed, push the stick with your opposite hand to raise your right / left arm in front of your body and then overhead. Raise your arm until you feel a stretch in your right / left shoulder, but before you have increased shoulder pain.  Try to avoid shrugging your right / left shoulder as your arm rises, by keeping your shoulder blade tucked down and toward your mid-back spine. Hold for __________ seconds.  Slowly return to the starting position. Repeat __________ times. Complete this exercise __________ times per day. STRETCH - Abduction, Supine  Lie on your back. With an underhand grip on your right / left hand and an overhand grip on the opposite hand, grasp a broomstick or cane so that your hands are a little more than shoulder width apart.  Keeping your right / left elbow straight and shoulder muscles relaxed, push the stick with your opposite hand to raise your right / left arm out to the side of your body and then overhead. Raise your arm until  you feel a stretch in your right / left shoulder, but before you have increased shoulder pain.  Try to avoid shrugging your right / left shoulder as your arm rises, by keeping your shoulder blade tucked down and toward your mid-back spine. Hold for __________ seconds.  Slowly return to the starting position. Repeat __________ times. Complete this exercise __________ times per day. ROM - Flexion, Active-Assisted  Lie on your back. You may bend your knees for comfort.  Grasp a broomstick or cane so your hands are about shoulder width apart. Your right / left hand should grip the end of the stick so that your hand is positioned "thumbs-up," as if you were about to shake hands.  Using your healthy arm to lead, raise your right / left arm overhead until you feel a gentle stretch in your shoulder. Hold for __________ seconds.  Use the stick to assist in returning your right / left arm to its starting position. Repeat __________ times. Complete this  exercise __________ times per day.  STRETCH - Flexion, Standing   Stand facing a wall. Walk your right / left fingers up the wall until you feel a moderate stretch in your shoulder. As your hand gets higher, you may need to step closer to the wall or use a door frame to walk through.  Try to avoid shrugging your right / left shoulder as your arm rises, by keeping your shoulder blade tucked down and toward your mid-back spine.  Hold for __________ seconds. Use your other hand, if needed, to ease out of the stretch and return to the starting position. Repeat __________ times. Complete this exercise __________ times per day.  ROM - Internal Rotation   Using underhand grips, grasp a stick behind your back with both hands.  While standing upright with good posture, slide the stick up your back until you feel a mild stretch in the front of your shoulder.  Hold for __________ seconds. Slowly return to your starting position. Repeat __________ times. Complete this exercise __________ times per day.  STRETCH - Internal Rotation  Place your right / left hand behind your back, palm-up.  Throw a towel or belt over your opposite shoulder. Grasp the towel with your right / left hand.  While keeping an upright posture, gently pull up on the towel until you feel a stretch in the front of your right / left shoulder.  Avoid shrugging your right / left shoulder as your arm rises, by keeping your shoulder blade tucked down and toward your mid-back spine.  Hold for __________ seconds. Release the stretch by lowering your opposite hand. Repeat __________ times. Complete this exercise __________ times per day. STRENGTHENING EXERCISES - Biceps Tendon Tendinitis (Proximal) and Tenosynovitis These exercises may help you regain your strength after your physician has discontinued your restraint in a cast or brace. They may resolve your symptoms with or without further involvement from your physician, physical  therapist or athletic trainer. While completing these exercises, remember:   Muscles can gain both the endurance and the strength needed for everyday activities through controlled exercises.  Complete these exercises as instructed by your physician, physical therapist or athletic trainer. Increase the resistance and repetitions only as guided.  You may experience muscle soreness or fatigue, but the pain or discomfort you are trying to eliminate should never worsen during these exercises. If this pain does get worse, stop and make sure you are following the directions exactly. If the pain is still present after adjustments,  discontinue the exercise until you can discuss the trouble with your caregiver. STRENGTH - Elbow Flexors, Isometric  Stand or sit upright on a firm surface. Place your right / left arm so that your hand is palm-up and at the height of your waist.  Place your opposite hand on top of your forearm. Gently push down as your right / left arm resists. Push as hard as you can with both arms, without causing any pain or movement at your right / left elbow. Hold this stationary position for __________ seconds.  Gradually release the tension in both arms. Allow your muscles to relax completely before repeating. Repeat __________ times. Complete this exercise __________ times per day. STRENGTH - Shoulder Flexion, Isometric  With good posture and facing a wall, stand or sit about 4-6 inches away.  Keeping your right / left elbow straight, gently press the top of your fist into the wall. Increase the pressure gradually until you are pressing as hard as you can, without shrugging your shoulder or increasing any shoulder discomfort.  Hold for __________ seconds.  Release the tension slowly. Relax your shoulder muscles completely before you start the next repetition. Repeat __________ times. Complete this exercise __________ times per day.  STRENGTH - Elbow Flexors, Supinated  With good  posture, stand or sit on a firm chair without armrests. Allow your right / left arm to rest at your side with your palm facing forward.  Holding a __________ weight, or gripping a rubber exercise band or tubing,  bring your hand toward your shoulder.  Allow your muscles to control the resistance as your hand returns to your side. Repeat __________ times. Complete this exercise __________ times per day.  STRENGTH - Shoulder Flexion  Stand or sit with good posture. Grasp a __________ weight, or an exercise band or tubing, so that your hand is "thumbs-up," like when you shake hands.  Slowly lift your right / left arm as far as you can, without increasing any shoulder pain. At first, many people can only raise their hand to shoulder height.  Avoid shrugging your right / left shoulder as your arm rises, by keeping your shoulder blade tucked down and toward your mid-back spine.  Hold for __________ seconds. Control the descent of your hand as you slowly return to your starting position. Repeat __________ times. Complete this exercise __________ times per day. Document Released: 02/26/2005 Document Revised: 05/21/2011 Document Reviewed: 06/10/2008 [facility] Patient Information 2015 [place], [place]. This information is not intended to replace advice given to you by your health care provider. Make sure you discuss any questions you have with your health care provider.     Bad carbs also include fruit juice, alcohol, and sweet tea. These are empty calories that do not signal to your brain that you are full.   Please remember the good carbs are still carbs which convert into sugar. So please measure them out no more than 1/2-1 cup of rice, oatmeal, pasta, and beans  Veggies are however free foods! Pile them on.   Not all fruit is created equal. Please see the list below, the fruit at the bottom is higher in sugars than the fruit at the top. Please avoid all dried fruits.     Before you even  begin to attack a weight-loss plan, it pays to remember this: You are not fat. You have fat. Losing weight isn't about blame or shame; it's simply another achievement to accomplish. Dieting is like any other skill-you have to buckle  down and work at it. As long as you act in a smart, reasonable way, you'll ultimately get where you want to be. Here are some weight loss pearls for you.  1. It's Not a Diet. It's a Lifestyle Thinking of a diet as something you're on and suffering through only for the short term doesn't work. To shed weight and keep it off, you need to make permanent changes to the way you eat. It's OK to indulge occasionally, of course, but if you cut calories temporarily and then revert to your old way of eating, you'll gain back the weight quicker than you can say yo-yo. Use it to lose it. Research shows that one of the best predictors of long-term weight loss is how many pounds you drop in the first month. For that reason, nutritionists often suggest being stricter for the first two weeks of your new eating strategy to build momentum. Cut out added sugar and alcohol and avoid unrefined carbs. After that, figure out how you can reincorporate them in a way that's healthy and maintainable.  2. There's a Right Way to Exercise Working out burns calories and fat and boosts your metabolism by building muscle. But those trying to lose weight are notorious for overestimating the number of calories they burn and underestimating the amount they take in. Unfortunately, your system is biologically programmed to hold on to extra pounds and that means when you start exercising, your body senses the deficit and ramps up its hunger signals. If you're not diligent, you'll eat everything you burn and then some. Use it to lose it. Cardio gets all the exercise glory, but strength and interval training are the real heroes. They help you build lean muscle, which in turn increases your metabolism and calorie-burning  ability 3. Don't Overreact to Mild Hunger Some people have a hard time losing weight because of hunger anxiety. To them, being hungry is bad-something to be avoided at all costs-so they carry snacks with them and eat when they don't need to. Others eat because they're stressed out or bored. While you never want to get to the point of being ravenous (that's when bingeing is likely to happen), a hunger pang, a craving, or the fact that it's 3:00 p.m. should not send you racing for the vending machine or obsessing about the energy bar in your purse. Ideally, you should put off eating until your stomach is growling and it's difficult to concentrate.  Use it to lose it. When you feel the urge to eat, use the HALT method. Ask yourself, Am I really hungry? Or am I angry or anxious, lonely or bored, or tired? If you're still not certain, try the apple test. If you're truly hungry, an apple should seem delicious; if it doesn't, something else is going on. Or you can try drinking water and making yourself busy, if you are still hungry try a healthy snack.  4. Not All Calories Are Created Equal The mechanics of weight loss are pretty simple: Take in fewer calories than you use for energy. But the kind of food you eat makes all the difference. Processed food that's high in saturated fat and refined starch or sugar can cause inflammation that disrupts the hormone signals that tell your brain you're full. The result: You eat a lot more.  Use it to lose it. Clean up your diet. Swap in whole, unprocessed foods, including vegetables, lean protein, and healthy fats that will fill you up and give you the biggest  nutritional bang for your calorie buck. In a few weeks, as your brain starts receiving regular hunger and fullness signals once again, you'll notice that you feel less hungry overall and naturally start cutting back on the amount you eat.  5. Protein, Produce, and Plant-Based Fats Are Your Weight-Loss Trinity Here's  why eating the three Ps regularly will help you drop pounds. Protein fills you up. You need it to build lean muscle, which keeps your metabolism humming so that you can torch more fat. People in a weight-loss program who ate double the recommended daily allowance for protein (about 110 grams for a 150-pound woman) lost 70 percent of their weight from fat, while people who ate the RDA lost only about 40 percent, one study found. Produce is packed with filling fiber. "It's very difficult to consume too many calories if you're eating a lot of vegetables. Example: Three cups of broccoli is a lot of food, yet only 93 calories. (Fruit is another story. It can be easy to overeat and can contain a lot of calories from sugar, so be sure to monitor your intake.) Plant-based fats like olive oil and those in avocados and nuts are healthy and extra satiating.  Use it to lose it. Aim to incorporate each of the three Ps into every meal and snack. People who eat protein throughout the day are able to keep weight off, according to a study in the [place] of [employer]. In addition to meat, poultry and seafood, good sources are beans, lentils, eggs, tofu, and yogurt. As for fat, keep portion sizes in check by measuring out salad dressing, oil, and nut butters (shoot for one to two tablespoons). Finally, eat veggies or a little fruit at every meal. People who did that consumed 308 fewer calories but didn't feel any hungrier than when they didn't eat more produce.  7. How You Eat Is As Important As What You Eat In order for your brain to register that you're full, you need to focus on what you're eating. Sit down whenever you eat, preferably at a table. Turn off the TV or computer, put down your phone, and look at your food. Smell it. Chew slowly, and don't put another bite on your fork until you swallow. When women ate lunch this attentively, they consumed 30 percent less when snacking later than those who  listened to an audiobook at lunchtime, according to a study in the [place] of Nutrition. 8. Weighing Yourself Really Works The scale provides the best evidence about whether your efforts are paying off. Seeing the numbers tick up or down or stagnate is motivation to keep going-or to rethink your approach. A 2015 study at [facility] found that daily weigh-ins helped people lose more weight, keep it off, and maintain that loss, even after two years. Use it to lose it. Step on the scale at the same time every day for the best results. If your weight shoots up several pounds from one weigh-in to the next, don't freak out. Eating a lot of salt the night before or having your period is the likely culprit. The number should return to normal in a day or two. It's a steady climb that you need to do something about. 9. Too Much Stress and Too Little Sleep Are Your Enemies When you're tired and frazzled, your body cranks up the production of cortisol, the stress hormone that can cause carb cravings. Not getting enough sleep also boosts your levels of ghrelin, a  hormone associated with hunger, while suppressing leptin, a hormone that signals fullness and satiety. People on a diet who slept only five and a half hours a night for two weeks lost 55 percent less fat and were hungrier than those who slept eight and a half hours, according to a study in the [place]. Use it to lose it. Prioritize sleep, aiming for seven hours or more a night, which research shows helps lower stress. And make sure you're getting quality zzz's. If a snoring spouse or a fidgety cat wakes you up frequently throughout the night, you may end up getting the equivalent of just four hours of sleep, according to a study from [facility]. Keep pets out of the bedroom, and use a white-noise app to drown out snoring. 10. You Will Hit a plateau-And You Can Bust Through It As you slim down, your body  releases much less leptin, the fullness hormone.  If you're not strength training, start right now. Building muscle can raise your metabolism to help you overcome a plateau. To keep your body challenged and burning calories, incorporate new moves and more intense intervals into your workouts or add another sweat session to your weekly routine. Alternatively, cut an extra 100 calories or so a day from your diet. Now that you've lost weight, your body simply doesn't need as much fuel.

## 2014-05-12 LAB — HEPATIC FUNCTION PANEL
ALK PHOS: 38 U/L — AB (ref 39–117)
ALT: 18 U/L (ref 0–35)
AST: 19 U/L (ref 0–37)
Albumin: 3.9 g/dL (ref 3.5–5.2)
Bilirubin, Direct: 0.1 mg/dL (ref 0.0–0.3)
Indirect Bilirubin: 0.2 mg/dL (ref 0.2–1.2)
Total Bilirubin: 0.3 mg/dL (ref 0.2–1.2)
Total Protein: 6.3 g/dL (ref 6.0–8.3)

## 2014-05-12 LAB — BASIC METABOLIC PANEL WITH GFR
BUN: 8 mg/dL (ref 6–23)
CO2: 30 mEq/L (ref 19–32)
Calcium: 8.9 mg/dL (ref 8.4–10.5)
Chloride: 104 mEq/L (ref 96–112)
Creat: 0.82 mg/dL (ref 0.50–1.10)
GFR, Est African American: >89 mL/min
GFR, Est Non African American: 83 mL/min
Glucose, Bld: 104 mg/dL — ABNORMAL HIGH (ref 70–99)
POTASSIUM: 4 meq/L (ref 3.5–5.3)
Sodium: 144 mEq/L (ref 135–145)

## 2014-05-12 LAB — LIPID PANEL
Cholesterol: 196 mg/dL (ref 0–200)
HDL: 52 mg/dL (ref >=46)
LDL CALC: 112 mg/dL — AB (ref 0–99)
TRIGLYCERIDES: 162 mg/dL — AB (ref <150)
Total CHOL/HDL Ratio: 3.8 Ratio
VLDL: 32 mg/dL (ref 0–40)

## 2014-05-12 LAB — TSH: TSH: 1.952 u[IU]/mL (ref 0.350–4.500)

## 2014-05-12 LAB — INSULIN, FASTING: Insulin fasting, serum: 121.5 u[IU]/mL — ABNORMAL HIGH (ref 2.0–19.6)

## 2014-06-17 ENCOUNTER — Ambulatory Visit: Payer: Self-pay | Admitting: Physician Assistant

## 2014-06-17 ENCOUNTER — Encounter: Payer: Self-pay | Admitting: Internal Medicine

## 2014-06-17 ENCOUNTER — Ambulatory Visit (INDEPENDENT_AMBULATORY_CARE_PROVIDER_SITE_OTHER): Payer: 59 | Admitting: Internal Medicine

## 2014-06-17 VITALS — BP 140/96 | HR 84 | Temp 98.6°F | Resp 18 | Ht 65.5 in | Wt 325.0 lb

## 2014-06-17 DIAGNOSIS — M25519 Pain in unspecified shoulder: Secondary | ICD-10-CM

## 2014-06-17 DIAGNOSIS — S39012A Strain of muscle, fascia and tendon of lower back, initial encounter: Secondary | ICD-10-CM

## 2014-06-17 NOTE — Patient Instructions (Signed)

## 2014-06-17 NOTE — Progress Notes (Signed)
   Subjective:    Patient ID: [name], female    DOB: 03-05-1961, 54 y.o.   MRN: [id number]  Shoulder Pain  Pertinent negatives include no fever or numbness.  Patient presents to the office for recheck of right shoulder pain.  She reports that her shoulder is doing much better. She is taking mobic and is also using flexeril at night time.  She reports that it is doing much better.  She is concerned because the right side of her back is bothering her.  She thinks that she is going to the gym and may have hurt it at the gym.  She is doing Agricultural engineer and zumba.  She does that Monday and Wednesday.  She reports that in class they did a lot of twisting exercises and sitting still doesn't bother her too much but if she is still for a long time it is sore.  She reports no loss of bowel or bladder.  She has no saddle anesthesias.  She has no history of back surgeries, IV drug use or cancer.      Review of Systems  Constitutional: Negative for fever, chills and fatigue.  Genitourinary: Negative for dysuria, urgency, flank pain, difficulty urinating and genital sores.  Musculoskeletal: Positive for back pain and arthralgias.  Neurological: Negative for dizziness, light-headedness and numbness.       Objective:   Physical Exam  Constitutional: She is oriented to person, place, and time. She appears well-developed and well-nourished. No distress.  HENT:  Head: Normocephalic and atraumatic.  Mouth/Throat: Oropharynx is clear and moist. No oropharyngeal exudate.  Eyes: Conjunctivae and EOM are normal. Pupils are equal, round, and reactive to light. No scleral icterus.  Neck: Normal range of motion. Neck supple. No JVD present. No thyromegaly present.  Cardiovascular: Normal rate, regular rhythm, normal heart sounds and intact distal pulses.  Exam reveals no gallop and no friction rub.   No murmur heard. Pulmonary/Chest: Effort normal and breath sounds normal. No respiratory distress.  She has no wheezes. She has no rales. She exhibits no tenderness.  Abdominal: Soft. Bowel sounds are normal. She exhibits no distension and no mass. There is no tenderness. There is no rebound and no guarding.  Musculoskeletal: Normal range of motion.       Right shoulder: Normal.  Patient rises slowly from sitting to standing.  They walk without an antalgic gait.  There is no evidence of erythema, ecchymosis, or gross deformity.  There is no tenderness to palpation over lumbar spine or paraspinal muscles .  Active ROM is limited due to pain.  Sensation to light touch is intact over all extremities.  Strength is symmetric and equal in all extremities.    Lymphadenopathy:    She has no cervical adenopathy.  Neurological: She is alert and oriented to person, place, and time. She has normal strength. No cranial nerve deficit or sensory deficit. Coordination normal.  Skin: Skin is warm and dry. She is not diaphoretic.  Psychiatric: She has a normal mood and affect. Her behavior is normal. Judgment and thought content normal.  Nursing note and vitals reviewed.         Assessment & Plan:    1. Pain in joint, shoulder region, unspecified laterality -pain resolving with mobic and flexeril prn -cont meds  2. Low back strain, initial encounter -exam benign -no focal neuro deficits -no cauda equina -rest -head -gentle stretching -cont meds above.

## 2014-08-09 IMAGING — US US RENAL ARTERY STENOSIS
1 series · 13 of 25 positions shown · non-contrast
Comparison: None.

CLINICAL DATA: Hypertension/? renal artery stenosis; uncontrolled
hypertension

EXAM:
RENAL/URINARY TRACT ULTRASOUND
RENAL DUPLEX ULTRASOUND

[Series 1: us renal artery stenosis · 0.49mm/px · 13 of 73 slices shown]
[im 1/73]
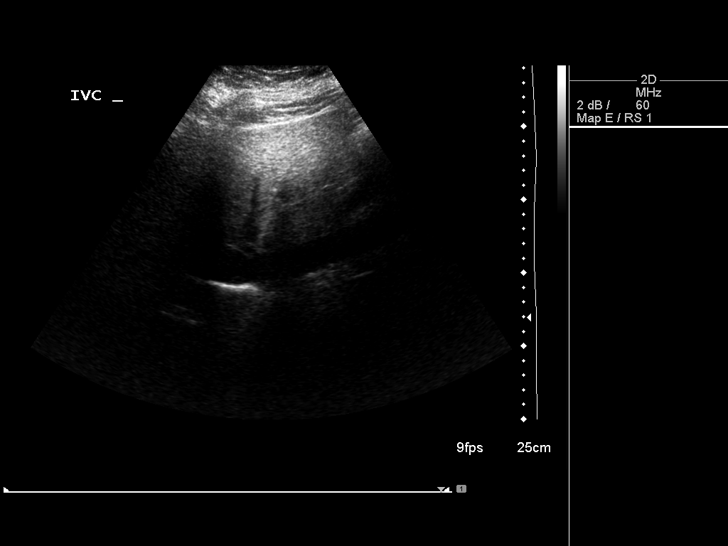
[im 7/73]
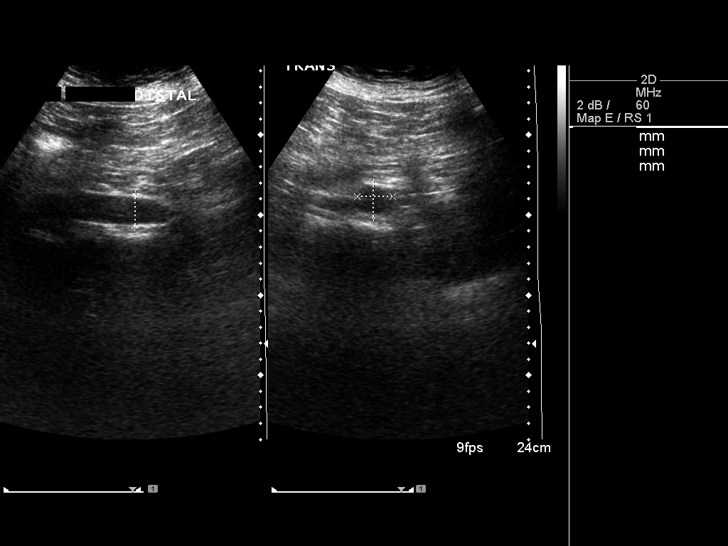
[im 13/73]
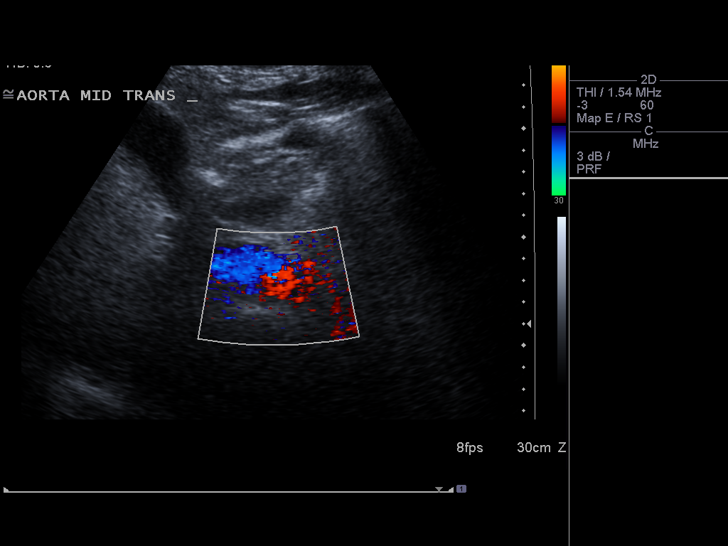
[im 19/73]
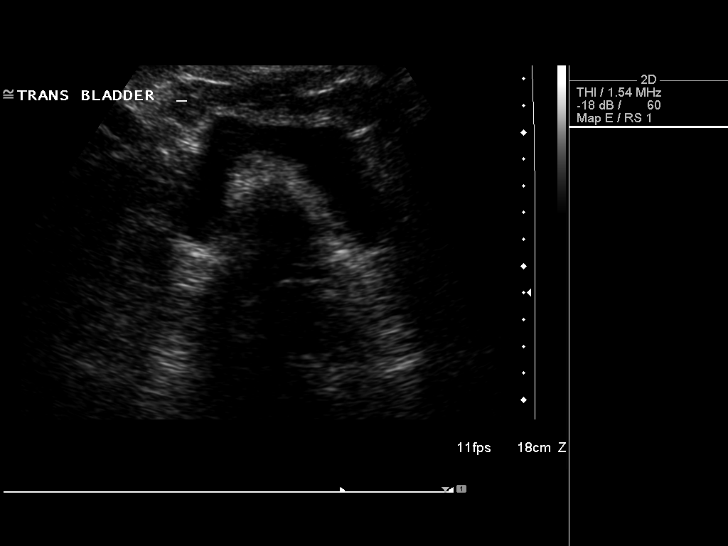
[im 25/73]
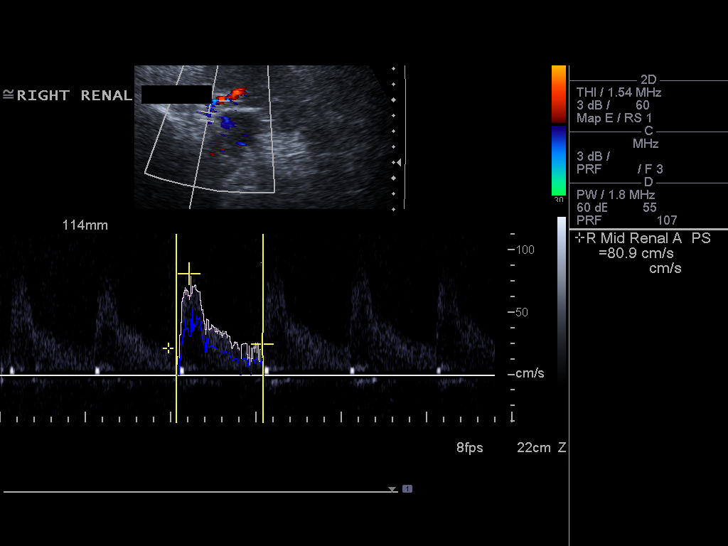
[im 31/73]
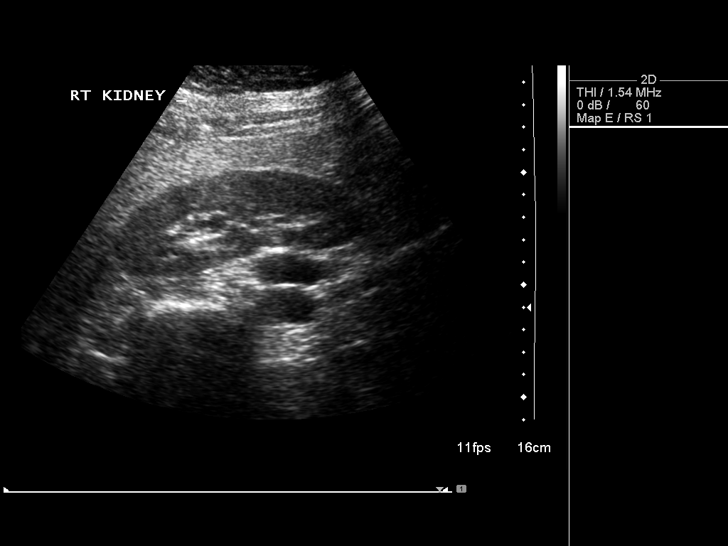
[im 37/73]
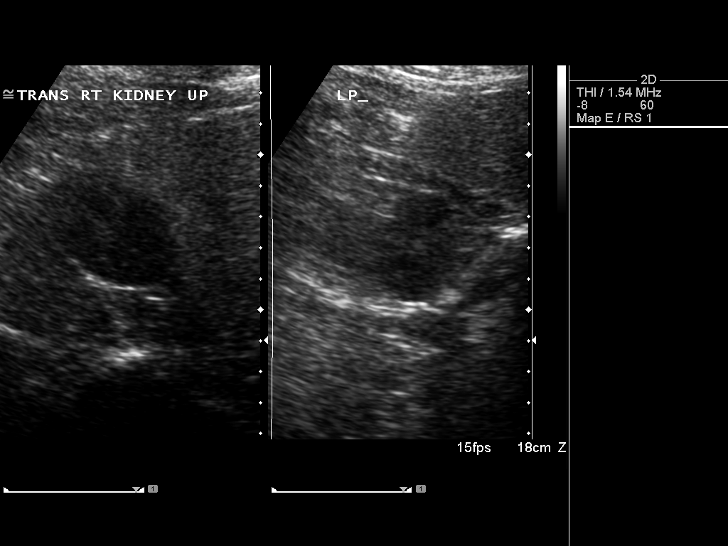
[im 43/73]
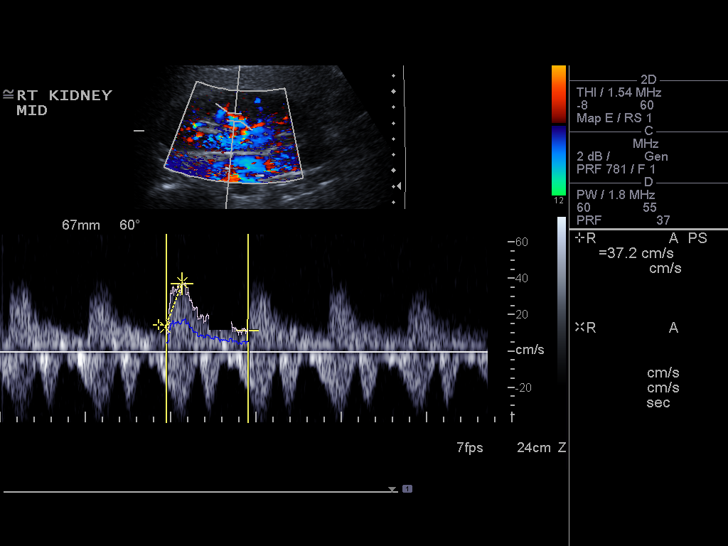
[im 49/73]
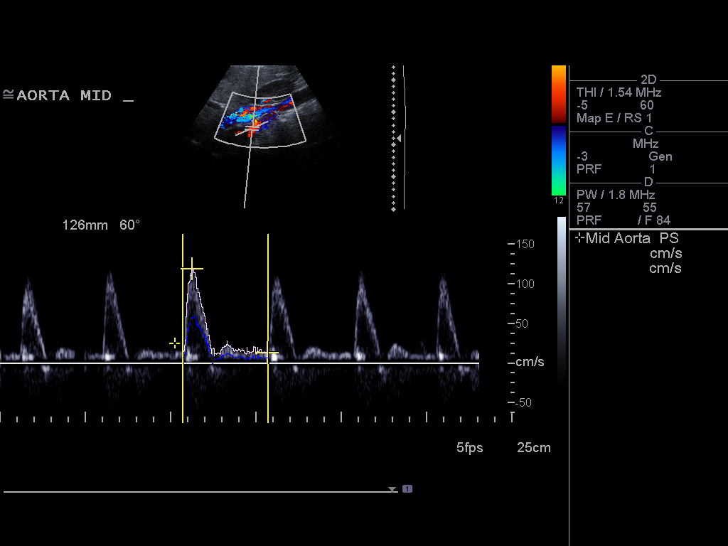
[im 55/73]
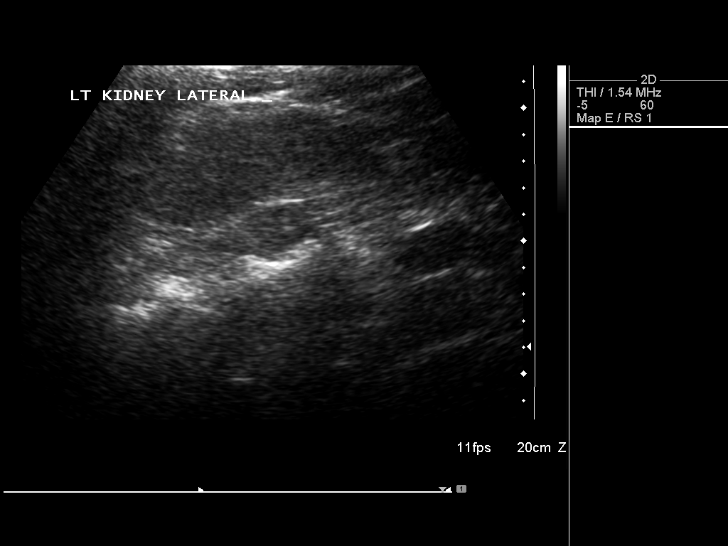
[im 61/73]
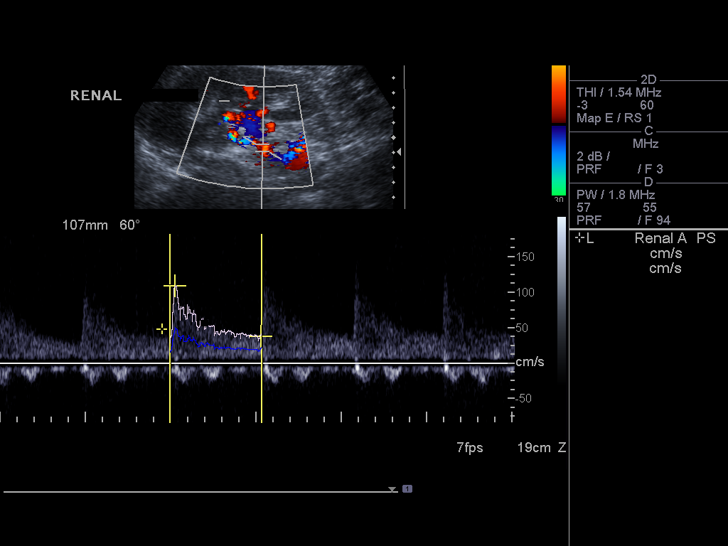
[im 67/73]
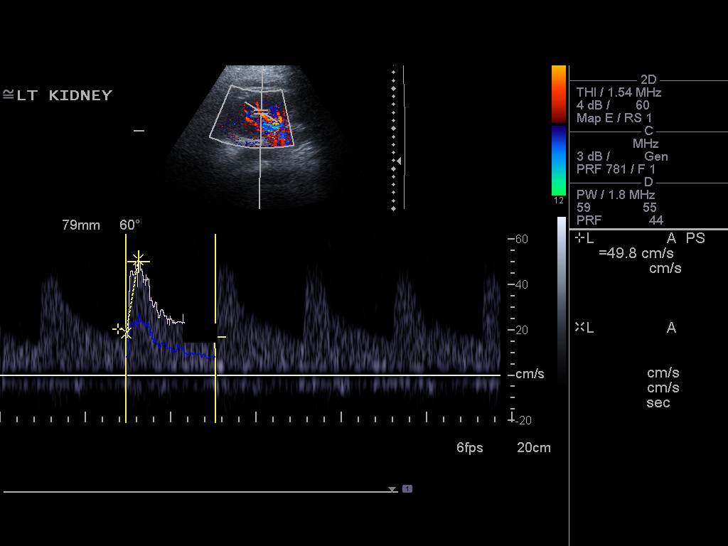
[im 73/73]
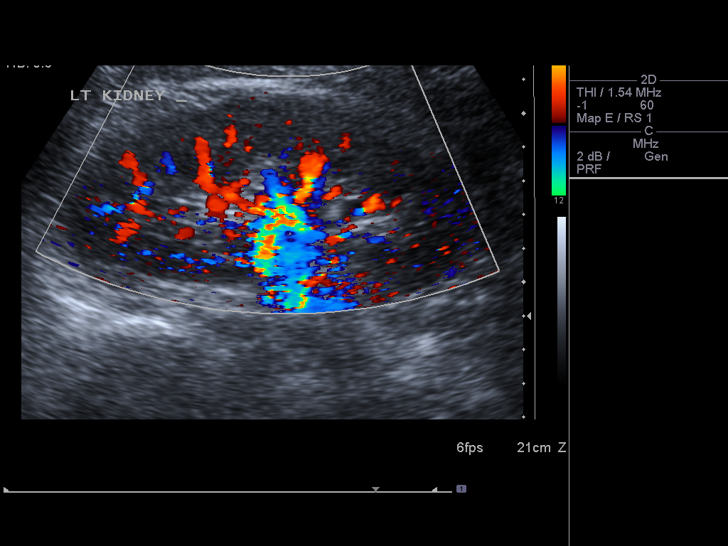

[13 of 25 positions shown; findings below may reference images not displayed]

FINDINGS: Right Kidney:

Length: 11.8 cm. Echogenicity within normal limits. No mass or
hydronephrosis visualized.

Left Kidney:

Length: 12.1 cm. Echogenicity within normal limits. No mass or
hydronephrosis visualized.

Bladder:  Incompletely distended, unremarkable.

RENAL DUPLEX ULTRASOUND

Right Renal Artery Velocities:

Origin:  124 cm/sec

Mid:  81 cm/sec

Hilum:  89 cm/sec

Interlobar:  41 cm/sec

Arcuate:  36 Cm/sec

Left Renal Artery Velocities:

Origin:  109 cm/sec

Mid:  92 cm/sec

Hilum:  64 cm/sec

Interlobar:  50 cm/sec

Arcuate:  26 cm/sec

Aortic Velocity:  119 Cm/sec

Right Renal-Aortic Ratios:

Origin:

Mid:

Hilum:

Interlobar:

Arcuate:

Left Renal-Aortic Ratios:

Origin:

Mid:

Hilum:

Interlobar:

Arcuate:
IMPRESSION: 1. No ultrasound evidence of hemodynamically significant renal
artery stenosis or other vascular lesion. If there is continued
clinical concern, renal MRA (lower radiation risk, can be performed
noncontrast in the setting of renal dysfunction) and CTA ( higher
spatial resolution) represent more accurate studies, which are
additionally more sensitive to the detection of duplicated renal
arteries.

## 2014-08-11 ENCOUNTER — Ambulatory Visit (INDEPENDENT_AMBULATORY_CARE_PROVIDER_SITE_OTHER): Payer: 59 | Admitting: Internal Medicine

## 2014-08-11 ENCOUNTER — Encounter: Payer: Self-pay | Admitting: Internal Medicine

## 2014-08-11 VITALS — BP 136/92 | HR 90 | Temp 98.2°F | Resp 18 | Ht 65.5 in | Wt 313.0 lb

## 2014-08-11 DIAGNOSIS — J309 Allergic rhinitis, unspecified: Secondary | ICD-10-CM

## 2014-08-11 MED ORDER — AZITHROMYCIN 250 MG PO TABS: ORAL_TABLET | ORAL | Status: DC

## 2014-08-11 MED ORDER — BENZONATATE 100 MG PO CAPS: 100.0000 mg | ORAL_CAPSULE | Freq: Four times a day (QID) | ORAL | Status: DC | PRN

## 2014-08-11 MED ORDER — PREDNISONE 20 MG PO TABS: ORAL_TABLET | ORAL | Status: DC

## 2014-08-11 NOTE — Progress Notes (Signed)
Patient ID: [name], female   DOB: [birth year], 54 y.o.   MRN: [id number]  HPI  Patient presents to the office for evaluation of cough.  It has been going on for 1 weeks.  Patient reports night > day, dry, worse with lying down.  They also endorse change in voice, postnasal drip and nasal congestion, rhinorrhea, sore throat..  They have tried zyrtec and cough medicine.  They report that nothing has worked.  They denies other sick contacts.  Patient does have mild seasonal allergies.   Review of Systems  Constitutional: Negative for fever, chills and malaise/fatigue.  HENT: Positive for congestion and sore throat. Negative for ear pain and nosebleeds.   Respiratory: Positive for cough. Negative for sputum production, shortness of breath and wheezing.   Cardiovascular: Negative for chest pain, palpitations and leg swelling.  Neurological: Positive for headaches.    PE:  General:  Alert and non-toxic, WDWN, NAD HEENT: NCAT, PERLA, EOM normal, no occular discharge or erythema.  Nasal mucosal edema with sinus tenderness to palpation.  Oropharynx clear with minimal oropharyngeal edema and erythema.  Mucous membranes moist and pink. Neck:  Cervical adenopathy Chest:  RRR no MRGs.  Lungs clear to auscultation A&P with no wheezes rhonchi or rales.   Abdomen: +BS x 4 quadrants, soft, non-tender, no guarding, rigidity, or rebound. Skin: warm and dry no rash Neuro: A&Ox4, CN II-XII grossly intact  Assessment and Plan:   1. Allergic rhinitis, unspecified allergic rhinitis type -zyrtec -nasal saline -Dymista sample given - predniSONE (DELTASONE) 20 MG tablet; 3 tabs po day one, then 2 tabs daily x 4 days  Dispense: 11 tablet; Refill: 0 - benzonatate (TESSALON PERLES) 100 MG capsule; Take 1 capsule (100 mg total) by mouth every 6 (six) hours as needed for cough.  Dispense: 30 capsule; Refill: 1 - azithromycin (ZITHROMAX Z-PAK) 250 MG tablet; 2 po day one, then 1 daily x 4 days  Dispense: 5  tablet; Refill: 0

## 2014-08-11 NOTE — Patient Instructions (Signed)

## 2014-08-23 ENCOUNTER — Ambulatory Visit (INDEPENDENT_AMBULATORY_CARE_PROVIDER_SITE_OTHER): Payer: 59 | Admitting: Internal Medicine

## 2014-08-23 ENCOUNTER — Encounter: Payer: Self-pay | Admitting: Emergency Medicine

## 2014-08-23 ENCOUNTER — Encounter: Payer: Self-pay | Admitting: Internal Medicine

## 2014-08-23 VITALS — BP 126/88 | Temp 98.2°F | Resp 18 | Ht 66.0 in | Wt 318.0 lb

## 2014-08-23 DIAGNOSIS — I10 Essential (primary) hypertension: Secondary | ICD-10-CM

## 2014-08-23 DIAGNOSIS — Z79899 Other long term (current) drug therapy: Secondary | ICD-10-CM

## 2014-08-23 DIAGNOSIS — E669 Obesity, unspecified: Secondary | ICD-10-CM

## 2014-08-23 DIAGNOSIS — E119 Type 2 diabetes mellitus without complications: Secondary | ICD-10-CM

## 2014-08-23 DIAGNOSIS — Z1212 Encounter for screening for malignant neoplasm of rectum: Secondary | ICD-10-CM

## 2014-08-23 DIAGNOSIS — E785 Hyperlipidemia, unspecified: Secondary | ICD-10-CM

## 2014-08-23 DIAGNOSIS — Z0000 Encounter for general adult medical examination without abnormal findings: Secondary | ICD-10-CM

## 2014-08-23 DIAGNOSIS — E559 Vitamin D deficiency, unspecified: Secondary | ICD-10-CM

## 2014-08-23 DIAGNOSIS — R5383 Other fatigue: Secondary | ICD-10-CM

## 2014-08-23 LAB — IRON AND TIBC
%SAT: 26 % (ref 20–55)
Iron: 87 ug/dL (ref 42–145)
TIBC: 339 ug/dL (ref 250–470)
UIBC: 252 ug/dL (ref 125–400)

## 2014-08-23 LAB — CBC WITH DIFFERENTIAL/PLATELET
BASOS ABS: 0 10*3/uL (ref 0.0–0.1)
BASOS PCT: 0 % (ref 0–1)
Eosinophils Absolute: 0 10*3/uL (ref 0.0–0.7)
Eosinophils Relative: 1 % (ref 0–5)
HEMATOCRIT: 38.9 % (ref 36.0–46.0)
HEMOGLOBIN: 12.9 g/dL (ref 12.0–15.0)
Lymphocytes Relative: 38 % (ref 12–46)
Lymphs Abs: 1.7 10*3/uL (ref 0.7–4.0)
MCH: 28.1 pg (ref 26.0–34.0)
MCHC: 33.2 g/dL (ref 30.0–36.0)
MCV: 84.7 fL (ref 78.0–100.0)
MPV: 9.7 fL (ref 8.6–12.4)
Monocytes Absolute: 0.3 10*3/uL (ref 0.1–1.0)
Monocytes Relative: 7 % (ref 3–12)
NEUTROS ABS: 2.4 10*3/uL (ref 1.7–7.7)
NEUTROS PCT: 54 % (ref 43–77)
Platelets: 348 10*3/uL (ref 150–400)
RBC: 4.59 MIL/uL (ref 3.87–5.11)
RDW: 14.9 % (ref 11.5–15.5)
WBC: 4.4 10*3/uL (ref 4.0–10.5)

## 2014-08-23 LAB — LIPID PANEL
CHOLESTEROL: 219 mg/dL — AB (ref 0–200)
HDL: 53 mg/dL (ref >=46)
LDL CALC: 137 mg/dL — AB (ref 0–99)
TRIGLYCERIDES: 143 mg/dL (ref <150)
Total CHOL/HDL Ratio: 4.1 Ratio
VLDL: 29 mg/dL (ref 0–40)

## 2014-08-23 LAB — BASIC METABOLIC PANEL WITH GFR
BUN: 14 mg/dL (ref 6–23)
CHLORIDE: 102 meq/L (ref 96–112)
CO2: 30 mEq/L (ref 19–32)
Calcium: 9.1 mg/dL (ref 8.4–10.5)
Creat: 0.94 mg/dL (ref 0.50–1.10)
GFR, EST NON AFRICAN AMERICAN: 70 mL/min
GFR, Est African American: 81 mL/min
GLUCOSE: 116 mg/dL — AB (ref 70–99)
POTASSIUM: 3.9 meq/L (ref 3.5–5.3)
SODIUM: 143 meq/L (ref 135–145)

## 2014-08-23 LAB — VITAMIN B12: VITAMIN B 12: 505 pg/mL (ref 211–911)

## 2014-08-23 LAB — HEMOGLOBIN A1C
HEMOGLOBIN A1C: 6.5 % — AB (ref <5.7)
Mean Plasma Glucose: 140 mg/dL — ABNORMAL HIGH (ref <117)

## 2014-08-23 LAB — HEPATIC FUNCTION PANEL
ALT: 21 U/L (ref 0–35)
AST: 21 U/L (ref 0–37)
Albumin: 4 g/dL (ref 3.5–5.2)
Alkaline Phosphatase: 38 U/L — ABNORMAL LOW (ref 39–117)
BILIRUBIN DIRECT: 0.1 mg/dL (ref 0.0–0.3)
BILIRUBIN INDIRECT: 0.4 mg/dL (ref 0.2–1.2)
TOTAL PROTEIN: 6.7 g/dL (ref 6.0–8.3)
Total Bilirubin: 0.5 mg/dL (ref 0.2–1.2)

## 2014-08-23 LAB — TSH: TSH: 2.129 u[IU]/mL (ref 0.350–4.500)

## 2014-08-23 LAB — MAGNESIUM: Magnesium: 1.8 mg/dL (ref 1.5–2.5)

## 2014-08-23 MED ORDER — VALSARTAN-HYDROCHLOROTHIAZIDE 320-25 MG PO TABS: 1.0000 | ORAL_TABLET | Freq: Every morning | ORAL | Status: DC

## 2014-08-23 MED ORDER — SIMVASTATIN 10 MG PO TABS: 10.0000 mg | ORAL_TABLET | Freq: Every day | ORAL | Status: DC

## 2014-08-23 MED ORDER — PHENTERMINE HCL 37.5 MG PO TABS: 37.5000 mg | ORAL_TABLET | Freq: Every day | ORAL | Status: DC

## 2014-08-23 MED ORDER — ERGOCALCIFEROL 1.25 MG (50000 UT) PO CAPS: 50000.0000 [IU] | ORAL_CAPSULE | ORAL | Status: DC

## 2014-08-23 NOTE — Patient Instructions (Signed)

## 2014-08-23 NOTE — Progress Notes (Signed)
Patient ID: [name], female   DOB: 02/26/1961, 53 y.o.   MRN: [id number]  Complete Physical  Assessment and Plan:   1. Essential hypertension  - Urinalysis, Routine w reflex microscopic (not at [facility]) - Microalbumin / creatinine urine ratio - EKG 12-Lead - [place], RETROPERITNL ABD,  LTD - TSH  2. Diabetes mellitus without complication -diet and exercise -phentermine -recheck in 2 months - Hemoglobin A1c - Insulin, random  3. Hyperlipidemia -cont meds - Lipid panel  4. Obesity -Phentermine -diet and exercise  5. Medication management  - CBC with Differential/Platelet - BASIC METABOLIC PANEL WITH GFR - Hepatic function panel - Magnesium  6. Screening for rectal cancer -UTD on colonoscopy - POC Hemoccult Bld/Stl (3-Cd Home Screen); Future  7. Other fatigue  - Iron and TIBC - Vitamin B12  8. Vitamin D deficiency -cont supplement -refill given - Vit D  25 hydroxy (rtn osteoporosis monitoring)    Discussed med's effects and SE's. Screening labs and tests as requested with regular follow-up as recommended.  HPI  54 y.o. female  presents for a complete physical.  Her blood pressure has been controlled at home, today their BP is BP: 126/88 mmHg.  She does workout.  She reports that she does an exercise class that she does twice weekly.  It is a combo of line dancing and zumba.  She reports that she really likes doing it. She denies chest pain, shortness of breath, dizziness. She reports that she checks this at home pretty frequently.  She reports that it has been 135 or less/90.    She is on cholesterol medication and denies myalgias. Her cholesterol is not at goal. The cholesterol last visit was:  Lab Results  Component Value Date   CHOL 196 05/11/2014   HDL 52 05/11/2014   LDLCALC 112* 05/11/2014   TRIG 162* 05/11/2014   CHOLHDL 3.8 05/11/2014  .  She has been working on diet and exercise for diabetes, she is not on bASA, she is on ACE/ARB and denies  foot ulcerations, hyperglycemia, hypoglycemia , increased appetite, nausea, paresthesia of the feet, polydipsia, polyuria, visual disturbances, vomiting and weight loss. Last A1C in the office was:  Lab Results  Component Value Date   HGBA1C 6.6* 05/11/2014    Patient is on Vitamin D supplement.   Lab Results  Component Value Date   VD25OH 29* 08/19/2013       Current Medications:  Current Outpatient Prescriptions on File Prior to Visit  Medication Sig Dispense Refill  . cetirizine (ZYRTEC) 10 MG tablet Take 10 mg by mouth daily as needed for allergies.     . cyclobenzaprine (FLEXERIL) 10 MG tablet Take 1 tablet (10 mg total) by mouth every 8 (eight) hours as needed for muscle spasms. 60 tablet 1  . ergocalciferol (VITAMIN D2) 50000 UNITS capsule Take 50,000 Units by mouth 3 (three) times a week.     . meloxicam (MOBIC) 15 MG tablet Take once daily as needed daily with food for pain, do not take aleve/ibuprofen with it, can take tylenol 90 tablet 0  . ranitidine (ZANTAC) 75 MG tablet Take 75 mg by mouth once as needed for heartburn.     . simvastatin (ZOCOR) 10 MG tablet Take 10 mg by mouth at bedtime.    . valsartan-hydrochlorothiazide (DIOVAN-HCT) 320-25 MG per tablet Take 1 tablet by mouth every morning.     No current facility-administered medications on file prior to visit.    Health Maintenance:   Immunization  History  Administered Date(s) Administered  . Influenza-Unspecified 01/28/2014  . PPD Test 08/19/2013  . Td 09/06/2003  . Tdap 08/19/2013   Last Dental Exam:  Child and Child Last Eye Exam: April, 2015, [facility]  Patient Care Team: Unk Pinto, MD as PCP - General (Internal Medicine) Molli Posey, MD as Consulting Physician (Obstetrics and Gynecology)  Allergies: No Known Allergies  Medical History:  Past Medical History  Diagnosis Date  . Type II or unspecified type diabetes mellitus without mention of complication, not stated as uncontrolled    . Hypertension   . Hyperlipidemia   . Allergy   . Obesity     Surgical History:  Past Surgical History  Procedure Laterality Date  . Cesarean section    . Umbilical hernia repair    . Tubal ligation    . Colonoscopy with propofol N/A 12/15/2013    Procedure: COLONOSCOPY WITH PROPOFOL;  Surgeon: Ladene Artist, MD;  Location: [facility];  Service: Endoscopy;  Laterality: N/A;    Family History:  Family History  Problem Relation Age of Onset  . Alcohol abuse Mother   . Hypertension Mother   . Cancer Father     throat  . Cancer Sister 42    breast  . Diabetes Son   . Diabetes Paternal Grandmother   . Colon cancer Neg Hx     Social History:  History  Substance Use Topics  . Smoking status: Never Smoker   . Smokeless tobacco: Never Used  . Alcohol Use: No    Review of Systems: Review of Systems  Constitutional: Negative for fever, chills, weight loss and malaise/fatigue.  HENT: Negative for congestion, ear pain and sore throat.   Eyes: Negative.   Respiratory: Negative for cough, shortness of breath and wheezing.   Cardiovascular: Negative for chest pain, palpitations and leg swelling.  Gastrointestinal: Negative for heartburn, nausea, vomiting, diarrhea, constipation, blood in stool and melena.  Genitourinary: Negative for dysuria, urgency and frequency.  Skin: Negative.   Neurological: Negative for dizziness, sensory change, loss of consciousness and headaches.  Psychiatric/Behavioral: Negative for depression. The patient is not nervous/anxious and does not have insomnia.   All other systems reviewed and are negative.  Physical Exam: Estimated body mass index is 51.35 kg/(m^2) as calculated from the following:   Height as of this encounter: 5\' 6"  (1.676 m).   Weight as of this encounter: 318 lb (144.244 kg). BP 126/88 mmHg  Temp(Src) 98.2 F (36.8 C) (Temporal)  Resp 18  Ht 5\' 6"  (1.676 m)  Wt 318 lb (144.244 kg)  BMI 51.35 kg/m2  General Appearance:  Well nourished well developed, in no apparent distress.  Eyes: PERRLA, EOMs, conjunctiva no swelling or erythema ENT/Mouth: Ear canals normal without obstruction, swelling, erythema, or discharge.  TMs normal bilaterally with no erythema, bulging, retraction, or loss of landmark.  Oropharynx moist and clear with no exudate, erythema, or swelling.   Neck: Supple, thyroid normal. No bruits.  No cervical adenopathy Respiratory: Respiratory effort normal, Breath sounds clear A&P without wheeze, rhonchi, rales.   Cardio: RRR without murmurs, rubs or gallops. Brisk peripheral pulses without edema.  Chest: symmetric, with normal excursions Breasts: Symmetric, without lumps, nipple discharge, retractions.  Abdomen: Obese, Soft, nontender, no guarding, rebound, hernias, masses, or organomegaly.  Lymphatics: Non tender without lymphadenopathy.  Musculoskeletal: Full ROM all peripheral extremities,5/5 strength, and normal gait.  Skin: Warm, dry without rashes, lesions, ecchymosis. Neuro: Awake and oriented X 3, Cranial nerves intact, reflexes equal bilaterally. Normal  muscle tone, no cerebellar symptoms. Sensation intact.  Psych:  normal affect, Insight and Judgment appropriate.   EKG: WNL no changes.  AORTA SCAN: WNL   Over 40 minutes of exam, counseling, chart review and critical decision making was performed  FORCUCCI, Phoebie Shad 9:36 AM [facility]

## 2014-08-24 LAB — URINALYSIS, ROUTINE W REFLEX MICROSCOPIC
BILIRUBIN URINE: NEGATIVE
GLUCOSE, UA: NEGATIVE mg/dL
Hgb urine dipstick: NEGATIVE
Ketones, ur: NEGATIVE mg/dL
Leukocytes, UA: NEGATIVE
Nitrite: NEGATIVE
Protein, ur: NEGATIVE mg/dL
Specific Gravity, Urine: 1.026 (ref 1.005–1.030)
Urobilinogen, UA: 0.2 mg/dL (ref 0.0–1.0)
pH: 5 (ref 5.0–8.0)

## 2014-08-24 LAB — MICROALBUMIN / CREATININE URINE RATIO
Creatinine, Urine: 293.1 mg/dL
Microalb Creat Ratio: 2.7 mg/g (ref 0.0–30.0)
Microalb, Ur: 0.8 mg/dL (ref <2.0)

## 2014-08-24 LAB — VITAMIN D 25 HYDROXY (VIT D DEFICIENCY, FRACTURES): Vit D, 25-Hydroxy: 16 ng/mL — ABNORMAL LOW (ref 30–100)

## 2014-08-24 LAB — INSULIN, RANDOM: INSULIN: 37.1 u[IU]/mL — AB (ref 2.0–19.6)

## 2014-11-02 ENCOUNTER — Ambulatory Visit: Payer: Self-pay | Admitting: Internal Medicine

## 2014-11-18 ENCOUNTER — Ambulatory Visit: Payer: Self-pay | Admitting: Internal Medicine

## 2014-11-26 ENCOUNTER — Encounter: Payer: Self-pay | Admitting: Internal Medicine

## 2014-11-26 ENCOUNTER — Ambulatory Visit (INDEPENDENT_AMBULATORY_CARE_PROVIDER_SITE_OTHER): Payer: 59 | Admitting: Internal Medicine

## 2014-11-26 VITALS — BP 128/86 | HR 76 | Temp 97.8°F | Resp 18 | Ht 66.0 in | Wt 323.0 lb

## 2014-11-26 DIAGNOSIS — I10 Essential (primary) hypertension: Secondary | ICD-10-CM | POA: Diagnosis not present

## 2014-11-26 DIAGNOSIS — E785 Hyperlipidemia, unspecified: Secondary | ICD-10-CM

## 2014-11-26 DIAGNOSIS — Z79899 Other long term (current) drug therapy: Secondary | ICD-10-CM

## 2014-11-26 DIAGNOSIS — E119 Type 2 diabetes mellitus without complications: Secondary | ICD-10-CM | POA: Diagnosis not present

## 2014-11-26 DIAGNOSIS — E559 Vitamin D deficiency, unspecified: Secondary | ICD-10-CM

## 2014-11-26 LAB — HEPATIC FUNCTION PANEL
ALBUMIN: 4 g/dL (ref 3.6–5.1)
ALK PHOS: 32 U/L — AB (ref 33–130)
ALT: 23 U/L (ref 6–29)
AST: 23 U/L (ref 10–35)
BILIRUBIN INDIRECT: 0.4 mg/dL (ref 0.2–1.2)
BILIRUBIN TOTAL: 0.5 mg/dL (ref 0.2–1.2)
Bilirubin, Direct: 0.1 mg/dL (ref <=0.2)
Total Protein: 6.1 g/dL (ref 6.1–8.1)

## 2014-11-26 LAB — BASIC METABOLIC PANEL WITH GFR
BUN: 10 mg/dL (ref 7–25)
CO2: 31 mmol/L (ref 20–31)
Calcium: 8.7 mg/dL (ref 8.6–10.4)
Chloride: 104 mmol/L (ref 98–110)
Creat: 0.86 mg/dL (ref 0.50–1.05)
GFR, EST NON AFRICAN AMERICAN: 78 mL/min (ref >=60)
Glucose, Bld: 101 mg/dL — ABNORMAL HIGH (ref 65–99)
Potassium: 3.7 mmol/L (ref 3.5–5.3)
Sodium: 145 mmol/L (ref 135–146)

## 2014-11-26 LAB — CBC WITH DIFFERENTIAL/PLATELET
BASOS ABS: 0 10*3/uL (ref 0.0–0.1)
Basophils Relative: 1 % (ref 0–1)
EOS ABS: 0.1 10*3/uL (ref 0.0–0.7)
Eosinophils Relative: 3 % (ref 0–5)
HCT: 37 % (ref 36.0–46.0)
Hemoglobin: 12.1 g/dL (ref 12.0–15.0)
LYMPHS PCT: 50 % — AB (ref 12–46)
Lymphs Abs: 2.5 10*3/uL (ref 0.7–4.0)
MCH: 28.4 pg (ref 26.0–34.0)
MCHC: 32.7 g/dL (ref 30.0–36.0)
MCV: 86.9 fL (ref 78.0–100.0)
MPV: 9.3 fL (ref 8.6–12.4)
Monocytes Absolute: 0.4 10*3/uL (ref 0.1–1.0)
Monocytes Relative: 9 % (ref 3–12)
Neutro Abs: 1.8 10*3/uL (ref 1.7–7.7)
Neutrophils Relative %: 37 % — ABNORMAL LOW (ref 43–77)
PLATELETS: 289 10*3/uL (ref 150–400)
RBC: 4.26 MIL/uL (ref 3.87–5.11)
RDW: 15 % (ref 11.5–15.5)
WBC: 4.9 10*3/uL (ref 4.0–10.5)

## 2014-11-26 LAB — LIPID PANEL
CHOL/HDL RATIO: 3.5 ratio (ref <=5.0)
CHOLESTEROL: 183 mg/dL (ref 125–200)
HDL: 52 mg/dL (ref >=46)
LDL Cholesterol: 110 mg/dL (ref <130)
Triglycerides: 103 mg/dL (ref <150)
VLDL: 21 mg/dL (ref <30)

## 2014-11-26 NOTE — Patient Instructions (Signed)
Varicose Veins Varicose veins are veins that have become enlarged and twisted. CAUSES This condition is the result of valves in the veins not working properly. Valves in the veins help return blood from the leg to the heart. When your calf muscles squeeze, the blood moves up your leg then the valves close and this continues until the blood gets back to your heart.  If these valves are damaged, blood flows backwards and backs up into the veins in the leg near the skin OR if your are sitting/standing for a long time without using your calf muscles the blood will back up into the veins in your legs. This causes the veins to become larger. People who are on their feet a lot, sit a lot without walking (like on a plane, at a desk, or in a car), who are pregnant, or who are overweight are more likely to develop varicose veins. SYMPTOMS   Bulging, twisted-appearing, bluish veins, most commonly found on the legs.  Leg pain or a feeling of heaviness. These symptoms may be worse at the end of the day.  Leg swelling.  Skin color changes. DIAGNOSIS  Varicose veins can usually be diagnosed with an exam of your legs by your caregiver. He or she may recommend an ultrasound of your leg veins. TREATMENT  Most varicose veins can be treated at home.However, other treatments are available for people who have persistent symptoms or who want to treat the cosmetic appearance of the varicose veins. But this is only cosmetic and they will return if not properly treated. These include:  Laser treatment of very small varicose veins.  Medicine that is shot (injected) into the vein. This medicine hardens the walls of the vein and closes off the vein. This treatment is called sclerotherapy. Afterwards, you may need to wear clothing or bandages that apply pressure.  Surgery. HOME CARE INSTRUCTIONS   Do not stand or sit in one position for long periods of time. Do not sit with your legs crossed. Rest with your legs raised  during the day.  Your legs have to be higher than your heart so that gravity will force the valves to open, so please really elevate your legs.   Wear elastic stockings or support hose. Do not wear other tight, encircling garments around the legs, pelvis, or waist.  [employer]  has a wide variety of well priced compression stockings. [address], [place] [address] [phone]  OR THERE ARE COPPER INFUSED COMPRESSION SOCKS AT [facility] OR [facility]  Walk as much as possible to increase blood flow.  Raise the foot of your bed at night with 2-inch blocks.  If you get a cut in the skin over the vein and the vein bleeds, lie down with your leg raised and press on it with a clean cloth until the bleeding stops. Then place a bandage (dressing) on the cut. See your caregiver if it continues to bleed or needs stitches. SEEK MEDICAL CARE IF:   The skin around your ankle starts to break down.  You have pain, redness, tenderness, or hard swelling developing in your leg over a vein.  You are uncomfortable due to leg pain. Document Released: 12/06/2004 Document Revised: 05/21/2011 Document Reviewed: 04/24/2010 [employer] Patient Information 2014 [employer].   

## 2014-11-26 NOTE — Progress Notes (Signed)
Patient ID: [name], female   DOB: March 30, 1960, 54 y.o.   MRN: [id number]  Assessment and Plan:  Hypertension:  -Continue medication -monitor blood pressure at home. -Continue DASH diet -Reminder to go to the ER if any CP, SOB, nausea, dizziness, severe HA, changes vision/speech, left arm numbness and tingling and jaw pain.  Cholesterol - Continue diet and exercise -Check cholesterol.   Diabetes without complications -Continue diet and exercise.  -Check A1C  Vitamin D Def -check level -continue medications.   Allergic rhinitis -flonase -cont zyrtec  Peripheral edema -info for elastic therapy given  Continue diet and meds as discussed. Further disposition pending results of labs. Discussed med's effects and SE's.    HPI 54 y.o. female  presents for 3 month follow up with hypertension, hyperlipidemia, diabetes and vitamin D deficiency.   Her blood pressure has been controlled at home, today their BP is BP: 128/86 mmHg.She does workout. She denies chest pain, shortness of breath, dizziness.  She reports that she is just now getting back into zumba and line dancing again.  She reports that they had a break in their classes.     She is not on cholesterol medication and denies myalgias. Her cholesterol is at goal. The cholesterol was:  08/23/2014: Cholesterol 219*; HDL 53; LDL Cholesterol 137*; Triglycerides 143   She has been working on diet and exercise for diabetes without complications, she is on bASA, she is on ACE/ARB, and denies  foot ulcerations, hyperglycemia, hypoglycemia , increased appetite, nausea, paresthesia of the feet, polydipsia, polyuria, visual disturbances, vomiting and weight loss. Last A1C was: 08/23/2014: Hgb A1c MFr Bld 6.5*.  She is due to start taking her phentermine yesterday.  She does not check blood sugars.     Patient is on Vitamin D supplement. 08/23/2014: Vit D, 25-Hydroxy 16*    Current Medications:  Current Outpatient Prescriptions on File  Prior to Visit  Medication Sig Dispense Refill  . cetirizine (ZYRTEC) 10 MG tablet Take 10 mg by mouth daily as needed for allergies.     . cyclobenzaprine (FLEXERIL) 10 MG tablet Take 1 tablet (10 mg total) by mouth every 8 (eight) hours as needed for muscle spasms. 60 tablet 1  . ergocalciferol (VITAMIN D2) 50000 UNITS capsule Take 1 capsule (50,000 Units total) by mouth 3 (three) times a week. 180 capsule 3  . phentermine (ADIPEX-P) 37.5 MG tablet Take 1 tablet (37.5 mg total) by mouth daily before breakfast. 30 tablet 1  . ranitidine (ZANTAC) 75 MG tablet Take 75 mg by mouth once as needed for heartburn.     . simvastatin (ZOCOR) 10 MG tablet Take 1 tablet (10 mg total) by mouth at bedtime. 90 tablet 2  . valsartan-hydrochlorothiazide (DIOVAN-HCT) 320-25 MG per tablet Take 1 tablet by mouth every morning. 90 tablet 2   No current facility-administered medications on file prior to visit.   Medical History:  Past Medical History  Diagnosis Date  . Type II or unspecified type diabetes mellitus without mention of complication, not stated as uncontrolled   . Hypertension   . Hyperlipidemia   . Allergy   . Obesity    Allergies: No Known Allergies   Review of Systems:  Review of Systems  Constitutional: Negative for fever, chills and malaise/fatigue.  HENT: Negative for congestion, ear pain and sore throat.   Respiratory: Negative for cough, shortness of breath and wheezing.   Cardiovascular: Positive for chest pain. Negative for palpitations and leg swelling.  Gastrointestinal: Positive for  heartburn. Negative for diarrhea, constipation, blood in stool and melena.  Genitourinary: Negative.   Skin: Negative.   Neurological: Negative for dizziness, sensory change, loss of consciousness and headaches.  Psychiatric/Behavioral: Negative for depression. The patient is not nervous/anxious and does not have insomnia.     Family history- Review and unchanged  Social history- Review and  unchanged  Physical Exam: BP 128/86 mmHg  Pulse 76  Temp(Src) 97.8 F (36.6 C) (Temporal)  Resp 18  Ht 5\' 6"  (4.098 m)  Wt 323 lb (146.512 kg)  BMI 52.16 kg/m2 Wt Readings from Last 3 Encounters:  11/26/14 323 lb (146.512 kg)  08/23/14 318 lb (144.244 kg)  08/11/14 313 lb (141.976 kg)   General Appearance: Well nourished well developed, non-toxic appearing, in no apparent distress. Eyes: PERRLA, EOMs, conjunctiva no swelling or erythema ENT/Mouth: Ear canals clear with no erythema, swelling, or discharge.  TMs normal bilaterally, oropharynx clear, moist, with no exudate.   Neck: Supple, thyroid normal, no JVD, no cervical adenopathy.  Respiratory: Respiratory effort normal, breath sounds clear A&P, no wheeze, rhonchi or rales noted.  No retractions, no accessory muscle usage Cardio: RRR with no MRGs. No noted edema.  Abdomen: Soft, + BS.  Non tender, no guarding, rebound, hernias, masses. Musculoskeletal: Full ROM, 5/5 strength, Normal gait Skin: Warm, dry without rashes, lesions, ecchymosis.  Neuro: Awake and oriented X 3, Cranial nerves intact. No cerebellar symptoms.  Psych: normal affect, Insight and Judgment appropriate.    Starlyn Skeans, PA-C 9:07 AM [facility]

## 2014-11-27 LAB — HEMOGLOBIN A1C
Hgb A1c MFr Bld: 6.6 % — ABNORMAL HIGH (ref <5.7)
MEAN PLASMA GLUCOSE: 143 mg/dL — AB (ref <117)

## 2015-01-24 ENCOUNTER — Other Ambulatory Visit: Payer: Self-pay | Admitting: *Deleted

## 2015-01-24 DIAGNOSIS — Z1212 Encounter for screening for malignant neoplasm of rectum: Secondary | ICD-10-CM

## 2015-01-24 LAB — POC HEMOCCULT BLD/STL (HOME/3-CARD/SCREEN)
Card #2 Fecal Occult Blod, POC: NEGATIVE
FECAL OCCULT BLD: NEGATIVE
FECAL OCCULT BLD: NEGATIVE

## 2015-03-02 ENCOUNTER — Ambulatory Visit: Payer: Self-pay | Admitting: Internal Medicine

## 2015-08-24 ENCOUNTER — Encounter: Payer: Self-pay | Admitting: Internal Medicine

## 2015-09-08 ENCOUNTER — Encounter: Payer: Self-pay | Admitting: Internal Medicine

## 2015-09-08 ENCOUNTER — Ambulatory Visit (INDEPENDENT_AMBULATORY_CARE_PROVIDER_SITE_OTHER): Payer: 59 | Admitting: Internal Medicine

## 2015-09-08 VITALS — BP 136/88 | HR 80 | Temp 98.2°F | Resp 18 | Ht 65.5 in | Wt 326.0 lb

## 2015-09-08 DIAGNOSIS — Z130 Encounter for screening for diseases of the blood and blood-forming organs and certain disorders involving the immune mechanism: Secondary | ICD-10-CM

## 2015-09-08 DIAGNOSIS — R6889 Other general symptoms and signs: Secondary | ICD-10-CM | POA: Diagnosis not present

## 2015-09-08 DIAGNOSIS — E119 Type 2 diabetes mellitus without complications: Secondary | ICD-10-CM

## 2015-09-08 DIAGNOSIS — E785 Hyperlipidemia, unspecified: Secondary | ICD-10-CM

## 2015-09-08 DIAGNOSIS — Z0001 Encounter for general adult medical examination with abnormal findings: Secondary | ICD-10-CM | POA: Diagnosis not present

## 2015-09-08 DIAGNOSIS — I10 Essential (primary) hypertension: Secondary | ICD-10-CM

## 2015-09-08 LAB — COMPREHENSIVE METABOLIC PANEL
ALT: 26 U/L (ref 6–29)
AST: 28 U/L (ref 10–35)
Albumin: 4.1 g/dL (ref 3.6–5.1)
Alkaline Phosphatase: 42 U/L (ref 33–130)
BUN: 13 mg/dL (ref 7–25)
CHLORIDE: 102 mmol/L (ref 98–110)
CO2: 28 mmol/L (ref 20–31)
Calcium: 9.1 mg/dL (ref 8.6–10.4)
Creat: 0.9 mg/dL (ref 0.50–1.05)
GLUCOSE: 98 mg/dL (ref 65–99)
POTASSIUM: 3.9 mmol/L (ref 3.5–5.3)
Sodium: 142 mmol/L (ref 135–146)
Total Bilirubin: 0.6 mg/dL (ref 0.2–1.2)
Total Protein: 6.8 g/dL (ref 6.1–8.1)

## 2015-09-08 LAB — LIPID PANEL
CHOLESTEROL: 206 mg/dL — AB (ref 125–200)
HDL: 60 mg/dL (ref >=46)
LDL Cholesterol: 126 mg/dL (ref <130)
Total CHOL/HDL Ratio: 3.4 Ratio (ref <=5.0)
Triglycerides: 99 mg/dL (ref <150)
VLDL: 20 mg/dL (ref <30)

## 2015-09-08 LAB — CBC WITH DIFFERENTIAL/PLATELET
BASOS PCT: 0 %
Basophils Absolute: 0 cells/uL (ref 0–200)
EOS ABS: 46 {cells}/uL (ref 15–500)
Eosinophils Relative: 1 %
HCT: 40.5 % (ref 35.0–45.0)
Hemoglobin: 13 g/dL (ref 11.7–15.5)
LYMPHS PCT: 46 %
Lymphs Abs: 2116 cells/uL (ref 850–3900)
MCH: 27.7 pg (ref 27.0–33.0)
MCHC: 32.1 g/dL (ref 32.0–36.0)
MCV: 86.2 fL (ref 80.0–100.0)
MONOS PCT: 10 %
MPV: 10 fL (ref 7.5–12.5)
Monocytes Absolute: 460 cells/uL (ref 200–950)
Neutro Abs: 1978 cells/uL (ref 1500–7800)
Neutrophils Relative %: 43 %
PLATELETS: 329 10*3/uL (ref 140–400)
RBC: 4.7 MIL/uL (ref 3.80–5.10)
RDW: 15.1 % — AB (ref 11.0–15.0)
WBC: 4.6 10*3/uL (ref 3.8–10.8)

## 2015-09-08 LAB — IRON AND TIBC
%SAT: 24 % (ref 11–50)
IRON: 88 ug/dL (ref 45–160)
TIBC: 361 ug/dL (ref 250–450)
UIBC: 273 ug/dL (ref 125–400)

## 2015-09-08 LAB — TSH: TSH: 2.58 mIU/L

## 2015-09-08 MED ORDER — VALSARTAN-HYDROCHLOROTHIAZIDE 320-25 MG PO TABS: 1.0000 | ORAL_TABLET | Freq: Every morning | ORAL | Status: DC

## 2015-09-08 MED ORDER — ERGOCALCIFEROL 1.25 MG (50000 UT) PO CAPS: 50000.0000 [IU] | ORAL_CAPSULE | ORAL | Status: DC

## 2015-09-08 MED ORDER — SIMVASTATIN 10 MG PO TABS: 10.0000 mg | ORAL_TABLET | Freq: Every day | ORAL | Status: DC

## 2015-09-08 NOTE — Progress Notes (Signed)
Complete Physical  Assessment and Plan: Due to high deductible plan labs were truncated today.   1. Encounter for general adult medical examination with abnormal findings  - CBC with Differential/Platelet - Comprehensive metabolic panel  2. Essential hypertension -well controlled -cont meds -dash diet - Microalbumin / creatinine urine ratio - TSH  3. Diabetes mellitus without complication ([place]) -cont diet and exercise -consider metformin if continued A1Cs - Hemoglobin A1c  4. Hyperlipidemia -cont medication -diet and exercise - Lipid panel  5. Screening for deficiency anemia  - Iron and TIBC  6.  Morbid obesity -diet and exercise as tolerated    Discussed med's effects and SE's. Screening labs and tests as requested with regular follow-up as recommended.  HPI  55 y.o. female  presents for a complete physical.  Her blood pressure has been controlled at home, today their BP is BP: 136/88 mmHg.  She does workout. She denies chest pain, shortness of breath, dizziness.   She is on cholesterol medication and denies myalgias. Her cholesterol is at goal. The cholesterol last visit was:  Lab Results  Component Value Date   CHOL 183 11/26/2014   HDL 52 11/26/2014   LDLCALC 110 11/26/2014   TRIG 103 11/26/2014   CHOLHDL 3.5 11/26/2014  .  She has been working on diet and exercise for diabetes, she is not on bASA, she is on ACE/ARB and denies foot ulcerations, hyperglycemia, hypoglycemia , increased appetite, nausea, paresthesia of the feet, polydipsia, polyuria, visual disturbances, vomiting and weight loss. Last A1C in the office was:  Lab Results  Component Value Date   HGBA1C 6.6* 11/26/2014  She reports that she has been doing line dancing/zumba twice weekly for the past year.  She really enjoys it.    Patient is on Vitamin D supplement.   Lab Results  Component Value Date   VD25OH 16* 08/23/2014     She ran out of simvistatin and her diovan.  She has been  off of it a couple weeks.    She does see Obgyn.  She went in January this year.  They have been keeping her up to date on her mammograms and pap smears.    Current Medications:  Current Outpatient Prescriptions on File Prior to Visit  Medication Sig Dispense Refill  . cetirizine (ZYRTEC) 10 MG tablet Take 10 mg by mouth daily as needed for allergies.     . ranitidine (ZANTAC) 75 MG tablet Take 75 mg by mouth once as needed for heartburn.      No current facility-administered medications on file prior to visit.    Health Maintenance:   Immunization History  Administered Date(s) Administered  . Influenza-Unspecified 01/28/2014  . PPD Test 08/19/2013  . Td 09/06/2003  . Tdap 08/19/2013    Tetanus: 2015 Pap: 03/13/15 MGM: 1/17  Colonoscopy: 2015  Last Eye Exam:  2016  Patient Care Team: Unk Pinto, MD as PCP - General (Internal Medicine) Molli Posey, MD as Consulting Physician (Obstetrics and Gynecology)  Allergies: No Known Allergies  Medical History:  Past Medical History  Diagnosis Date  . Type II or unspecified type diabetes mellitus without mention of complication, not stated as uncontrolled   . Hypertension   . Hyperlipidemia   . Allergy   . Obesity     Surgical History:  Past Surgical History  Procedure Laterality Date  . Cesarean section    . Umbilical hernia repair    . Tubal ligation    . Colonoscopy with propofol N/A  12/15/2013    Procedure: COLONOSCOPY WITH PROPOFOL;  Surgeon: Ladene Artist, MD;  Location: [facility];  Service: Endoscopy;  Laterality: N/A;    Family History:  Family History  Problem Relation Age of Onset  . Alcohol abuse Mother   . Hypertension Mother   . Cancer Father     throat  . Cancer Sister 45    breast  . Diabetes Son   . Diabetes Paternal Grandmother   . Colon cancer Neg Hx     Social History:  Social History  Substance Use Topics  . Smoking status: Never Smoker   . Smokeless tobacco: Never Used  .  Alcohol Use: No    Review of Systems: Review of Systems  Constitutional: Negative for fever, chills and malaise/fatigue.  HENT: Negative for congestion, ear pain and sore throat.   Respiratory: Positive for cough. Negative for shortness of breath and wheezing.   Cardiovascular: Negative for chest pain, palpitations and leg swelling.  Gastrointestinal: Negative for heartburn, abdominal pain, diarrhea, constipation, blood in stool and melena.  Genitourinary: Negative.   Skin: Negative.   Neurological: Positive for sensory change. Negative for dizziness, loss of consciousness and headaches.  Psychiatric/Behavioral: Negative for depression. The patient is not nervous/anxious and does not have insomnia.     Physical Exam: Estimated body mass index is 53.41 kg/(m^2) as calculated from the following:   Height as of this encounter: 5' 5.5" (1.664 m).   Weight as of this encounter: 326 lb (147.873 kg). BP 136/88 mmHg  Pulse 80  Temp(Src) 98.2 F (36.8 C) (Temporal)  Resp 18  Ht 5' 5.5" (1.664 m)  Wt 326 lb (147.873 kg)  BMI 53.41 kg/m2  General Appearance: Obese, Well nourished well developed, in no apparent distress.  Eyes: PERRLA, EOMs, conjunctiva no swelling or erythema ENT/Mouth: Ear canals normal without obstruction, swelling, erythema, or discharge.  TMs normal bilaterally with no erythema, bulging, retraction, or loss of landmark.  Oropharynx moist and clear with no exudate, erythema, or swelling.   Neck: Supple, thyroid normal. No bruits.  No cervical adenopathy Respiratory: Respiratory effort normal, Breath sounds clear A&P without wheeze, rhonchi, rales.   Cardio: RRR without murmurs, rubs or gallops. Brisk peripheral pulses without edema.  Chest: symmetric, with normal excursions Breasts: Deferred to obgyn  Abdomen: Soft, nontender, no guarding, rebound, hernias, masses, or organomegaly.  Lymphatics: Non tender without lymphadenopathy.  Genitourinary:  Sees Obgyn, deferred   Musculoskeletal: Full ROM all peripheral extremities,5/5 strength, and normal gait.  Skin: Warm, dry without rashes, lesions, ecchymosis. Small irregular chocolate colored 0.5 mm nevi to the left anterior tibial region.   Neuro: Awake and oriented X 3, Cranial nerves intact, reflexes equal bilaterally. Normal muscle tone, no cerebellar symptoms. Sensation intact.  Psych:  normal affect, Insight and Judgment appropriate.    Over 40 minutes of exam, counseling, chart review and critical decision making was performed  Loma Sousa Forcucci 9:22 AM [facility]

## 2015-09-08 NOTE — Patient Instructions (Signed)
Preventive Care for Adults  A healthy lifestyle and preventive care can promote health and wellness. Preventive health guidelines for women include the following key practices.  A routine yearly physical is a good way to check with your health care provider about your health and preventive screening. It is a chance to share any concerns and updates on your health and to receive a thorough exam.  Visit your dentist for a routine exam and preventive care every 6 months. Brush your teeth twice a day and floss once a day. Good oral hygiene prevents tooth decay and gum disease.  The frequency of eye exams is based on your age, health, family medical history, use of contact lenses, and other factors. Follow your health care provider's recommendations for frequency of eye exams.  Eat a healthy diet. Foods like vegetables, fruits, whole grains, low-fat dairy products, and lean protein foods contain the nutrients you need without too many calories. Decrease your intake of foods high in solid fats, added sugars, and salt. Eat the right amount of calories for you.Get information about a proper diet from your health care provider, if necessary.  Regular physical exercise is one of the most important things you can do for your health. Most adults should get at least 150 minutes of moderate-intensity exercise (any activity that increases your heart rate and causes you to sweat) each week. In addition, most adults need muscle-strengthening exercises on 2 or more days a week.  Maintain a healthy weight. The body mass index (BMI) is a screening tool to identify possible weight problems. It provides an estimate of body fat based on height and weight. Your health care provider can find your BMI and can help you achieve or maintain a healthy weight.For adults 20 years and older:  A BMI below 18.5 is considered underweight.  A BMI of 18.5 to 24.9 is normal.  A BMI of 25 to 29.9 is considered overweight.  A BMI  of 30 and above is considered obese.  Maintain normal blood lipids and cholesterol levels by exercising and minimizing your intake of saturated fat. Eat a balanced diet with plenty of fruit and vegetables. Blood tests for lipids and cholesterol should begin at age 58 and be repeated every 5 years. If your lipid or cholesterol levels are high, you are over 50, or you are at high risk for heart disease, you may need your cholesterol levels checked more frequently.Ongoing high lipid and cholesterol levels should be treated with medicines if diet and exercise are not working.  If you smoke, find out from your health care provider how to quit. If you do not use tobacco, do not start.  Lung cancer screening is recommended for adults aged 58-80 years who are at high risk for developing lung cancer because of a history of smoking. A yearly low-dose CT scan of the lungs is recommended for people who have at least a 30-pack-year history of smoking and are a current smoker or have quit within the past 15 years. A pack year of smoking is smoking an average of 1 pack of cigarettes a day for 1 year (for example: 1 pack a day for 30 years or 2 packs a day for 15 years). Yearly screening should continue until the smoker has stopped smoking for at least 15 years. Yearly screening should be stopped for people who develop a health problem that would prevent them from having lung cancer treatment.  High blood pressure causes heart disease and increases the risk of  stroke. Your blood pressure should be checked at least every 1 to 2 years. Ongoing high blood pressure should be treated with medicines if weight loss and exercise do not work.  If you are 55-79 years old, ask your health care provider if you should take aspirin to prevent strokes.  Diabetes screening involves taking a blood sample to check your fasting blood sugar level. This should be done once every 3 years, after age 45, if you are within normal weight and  without risk factors for diabetes. Testing should be considered at a younger age or be carried out more frequently if you are overweight and have at least 1 risk factor for diabetes.  Breast cancer screening is essential preventive care for women. You should practice "breast self-awareness." This means understanding the normal appearance and feel of your breasts and may include breast self-examination. Any changes detected, no matter how small, should be reported to a health care provider. Women in their 20s and 30s should have a clinical breast exam (CBE) by a health care provider as part of a regular health exam every 1 to 3 years. After age 40, women should have a CBE every year. Starting at age 40, women should consider having a mammogram (breast X-ray test) every year. Women who have a family history of breast cancer should talk to their health care provider about genetic screening. Women at a high risk of breast cancer should talk to their health care providers about having an MRI and a mammogram every year.  Breast cancer gene (BRCA)-related cancer risk assessment is recommended for women who have family members with BRCA-related cancers. BRCA-related cancers include breast, ovarian, tubal, and peritoneal cancers. Having family members with these cancers may be associated with an increased risk for harmful changes (mutations) in the breast cancer genes BRCA1 and BRCA2. Results of the assessment will determine the need for genetic counseling and BRCA1 and BRCA2 testing.  Routine pelvic exams to screen for cancer are no longer recommended for nonpregnant women who are considered low risk for cancer of the pelvic organs (ovaries, uterus, and vagina) and who do not have symptoms. Ask your health care provider if a screening pelvic exam is right for you.  If you have had past treatment for cervical cancer or a condition that could lead to cancer, you need Pap tests and screening for cancer for at least 20  years after your treatment. If Pap tests have been discontinued, your risk factors (such as having a new sexual partner) need to be reassessed to determine if screening should be resumed. Some women have medical problems that increase the chance of getting cervical cancer. In these cases, your health care provider may recommend more frequent screening and Pap tests.  Colorectal cancer can be detected and often prevented. Most routine colorectal cancer screening begins at the age of 50 years and continues through age 75 years. However, your health care provider may recommend screening at an earlier age if you have risk factors for colon cancer. On a yearly basis, your health care provider may provide home test kits to check for hidden blood in the stool. Use of a small camera at the end of a tube, to directly examine the colon (sigmoidoscopy or colonoscopy), can detect the earliest forms of colorectal cancer. Talk to your health care provider about this at age 50, when routine screening begins. Direct exam of the colon should be repeated every 5-10 years through age 75 years, unless early forms of pre-cancerous   polyps or small growths are found.  Hepatitis C blood testing is recommended for all people born from 1945 through 1965 and any individual with known risks for hepatitis C.  Pra  Osteoporosis is a disease in which the bones lose minerals and strength with aging. This can result in serious bone fractures or breaks. The risk of osteoporosis can be identified using a bone density scan. Women ages 65 years and over and women at risk for fractures or osteoporosis should discuss screening with their health care providers. Ask your health care provider whether you should take a calcium supplement or vitamin D to reduce the rate of osteoporosis.  Menopause can be associated with physical symptoms and risks. Hormone replacement therapy is available to decrease symptoms and risks. You should talk to your  health care provider about whether hormone replacement therapy is right for you.  Use sunscreen. Apply sunscreen liberally and repeatedly throughout the day. You should seek shade when your shadow is shorter than you. Protect yourself by wearing long sleeves, pants, a wide-brimmed hat, and sunglasses year round, whenever you are outdoors.  Once a month, do a whole body skin exam, using a mirror to look at the skin on your back. Tell your health care provider of new moles, moles that have irregular borders, moles that are larger than a pencil eraser, or moles that have changed in shape or color.  Stay current with required vaccines (immunizations).  Influenza vaccine. All adults should be immunized every year.  Tetanus, diphtheria, and acellular pertussis (Td, Tdap) vaccine. Pregnant women should receive 1 dose of Tdap vaccine during each pregnancy. The dose should be obtained regardless of the length of time since the last dose. Immunization is preferred during the 27th-36th week of gestation. An adult who has not previously received Tdap or who does not know her vaccine status should receive 1 dose of Tdap. This initial dose should be followed by tetanus and diphtheria toxoids (Td) booster doses every 10 years. Adults with an unknown or incomplete history of completing a 3-dose immunization series with Td-containing vaccines should begin or complete a primary immunization series including a Tdap dose. Adults should receive a Td booster every 10 years.  Varicella vaccine. An adult without evidence of immunity to varicella should receive 2 doses or a second dose if she has previously received 1 dose. Pregnant females who do not have evidence of immunity should receive the first dose after pregnancy. This first dose should be obtained before leaving the health care facility. The second dose should be obtained 4-8 weeks after the first dose.  Human papillomavirus (HPV) vaccine. Females aged 13-26 years  who have not received the vaccine previously should obtain the 3-dose series. The vaccine is not recommended for use in pregnant females. However, pregnancy testing is not needed before receiving a dose. If a female is found to be pregnant after receiving a dose, no treatment is needed. In that case, the remaining doses should be delayed until after the pregnancy. Immunization is recommended for any person with an immunocompromised condition through the age of 26 years if she did not get any or all doses earlier. During the 3-dose series, the second dose should be obtained 4-8 weeks after the first dose. The third dose should be obtained 24 weeks after the first dose and 16 weeks after the second dose.  Zoster vaccine. One dose is recommended for adults aged 60 years or older unless certain conditions are present.  Measles, mumps, and rubella (  MMR) vaccine. Adults born before [birth year] generally are considered immune to measles and mumps. Adults born in [birth year] or later should have 1 or more doses of MMR vaccine unless there is a contraindication to the vaccine or there is laboratory evidence of immunity to each of the three diseases. A routine second dose of MMR vaccine should be obtained at least 28 days after the first dose for students attending postsecondary schools, health care workers, or international travelers. People who received inactivated measles vaccine or an unknown type of measles vaccine during 1963-1967 should receive 2 doses of MMR vaccine. People who received inactivated mumps vaccine or an unknown type of mumps vaccine before 1979 and are at high risk for mumps infection should consider immunization with 2 doses of MMR vaccine. For females of childbearing age, rubella immunity should be determined. If there is no evidence of immunity, females who are not pregnant should be vaccinated. If there is no evidence of immunity, females who are pregnant should delay immunization until after pregnancy.  Unvaccinated health care workers born before [birth year] who lack laboratory evidence of measles, mumps, or rubella immunity or laboratory confirmation of disease should consider measles and mumps immunization with 2 doses of MMR vaccine or rubella immunization with 1 dose of MMR vaccine.  Pneumococcal 13-valent conjugate (PCV13) vaccine. When indicated, a person who is uncertain of her immunization history and has no record of immunization should receive the PCV13 vaccine. An adult aged 31 years or older who has certain medical conditions and has not been previously immunized should receive 1 dose of PCV13 vaccine. This PCV13 should be followed with a dose of pneumococcal polysaccharide (PPSV23) vaccine. The PPSV23 vaccine dose should be obtained at least 8 weeks after the dose of PCV13 vaccine. An adult aged 69 years or older who has certain medical conditions and previously received 1 or more doses of PPSV23 vaccine should receive 1 dose of PCV13. The PCV13 vaccine dose should be obtained 1 or more years after the last PPSV23 vaccine dose.    Pneumococcal polysaccharide (PPSV23) vaccine. When PCV13 is also indicated, PCV13 should be obtained first. All adults aged 49 years and older should be immunized. An adult younger than age 20 years who has certain medical conditions should be immunized. Any person who resides in a nursing home or long-term care facility should be immunized. An adult smoker should be immunized. People with an immunocompromised condition and certain other conditions should receive both PCV13 and PPSV23 vaccines. People with human immunodeficiency virus (HIV) infection should be immunized as soon as possible after diagnosis. Immunization during chemotherapy or radiation therapy should be avoided. Routine use of PPSV23 vaccine is not recommended for American Indians, [place] Natives, or people younger than 65 years unless there are medical conditions that require PPSV23 vaccine. When indicated,  people who have unknown immunization and have no record of immunization should receive PPSV23 vaccine. One-time revaccination 5 years after the first dose of PPSV23 is recommended for people aged 19-64 years who have chronic kidney failure, nephrotic syndrome, asplenia, or immunocompromised conditions. People who received 1-2 doses of PPSV23 before age 15 years should receive another dose of PPSV23 vaccine at age 13 years or later if at least 5 years have passed since the previous dose. Doses of PPSV23 are not needed for people immunized with PPSV23 at or after age 53 years.  Preventive Services / Frequency   Ages 72 to 7 years  Blood pressure check.  Lipid and cholesterol check.  Lung  cancer screening. / Every year if you are aged 63-80 years and have a 30-pack-year history of smoking and currently smoke or have quit within the past 15 years. Yearly screening is stopped once you have quit smoking for at least 15 years or develop a health problem that would prevent you from having lung cancer treatment.  Clinical breast exam.** / Every year after age 28 years.  BRCA-related cancer risk assessment.** / For women who have family members with a BRCA-related cancer (breast, ovarian, tubal, or peritoneal cancers).  Mammogram.** / Every year beginning at age 37 years and continuing for as long as you are in good health. Consult with your health care provider.  Pap test.** / Every 3 years starting at age 67 years through age 4 or 2 years with a history of 3 consecutive normal Pap tests.  HPV screening.** / Every 3 years from ages 39 years through ages 38 to 63 years with a history of 3 consecutive normal Pap tests.  Fecal occult blood test (FOBT) of stool. / Every year beginning at age 50 years and continuing until age 19 years. You may not need to do this test if you get a colonoscopy every 10 years.  Flexible sigmoidoscopy or colonoscopy.** / Every 5 years for a flexible sigmoidoscopy or  every 10 years for a colonoscopy beginning at age 51 years and continuing until age 21 years.  Hepatitis C blood test.** / For all people born from [birth year] through 1965 and any individual with known risks for hepatitis C.  Skin self-exam. / Monthly.  Influenza vaccine. / Every year.  Tetanus, diphtheria, and acellular pertussis (Tdap/Td) vaccine.** / Consult your health care provider. Pregnant women should receive 1 dose of Tdap vaccine during each pregnancy. 1 dose of Td every 10 years.  Varicella vaccine.** / Consult your health care provider. Pregnant females who do not have evidence of immunity should receive the first dose after pregnancy.  Zoster vaccine.** / 1 dose for adults aged 14 years or older.  Pneumococcal 13-valent conjugate (PCV13) vaccine.** / Consult your health care provider.  Pneumococcal polysaccharide (PPSV23) vaccine.** / 1 to 2 doses if you smoke cigarettes or if you have certain conditions.  Meningococcal vaccine.** / Consult your health care provider.  Hepatitis A vaccine.** / Consult your health care provider.  Hepatitis B vaccine.** / Consult your health care provider. Screening for abdominal aortic aneurysm (AAA)  by ultrasound is recommended for people over 50 who have history of high blood pressure or who are current or former smokers.

## 2015-09-09 LAB — MICROALBUMIN / CREATININE URINE RATIO
Creatinine, Urine: 300 mg/dL (ref 20–320)
Microalb Creat Ratio: 4 mcg/mg creat (ref <30)
Microalb, Ur: 1.1 mg/dL

## 2015-09-09 LAB — HEMOGLOBIN A1C
HEMOGLOBIN A1C: 6.3 % — AB (ref <5.7)
MEAN PLASMA GLUCOSE: 134 mg/dL

## 2015-12-19 ENCOUNTER — Ambulatory Visit: Payer: Self-pay | Admitting: Internal Medicine

## 2015-12-26 ENCOUNTER — Encounter: Payer: Self-pay | Admitting: Internal Medicine

## 2015-12-26 ENCOUNTER — Ambulatory Visit (INDEPENDENT_AMBULATORY_CARE_PROVIDER_SITE_OTHER): Payer: Managed Care, Other (non HMO) | Admitting: Internal Medicine

## 2015-12-26 VITALS — BP 128/82 | HR 80 | Temp 98.2°F | Resp 18 | Ht 65.5 in | Wt 327.0 lb

## 2015-12-26 DIAGNOSIS — Z23 Encounter for immunization: Secondary | ICD-10-CM

## 2015-12-26 DIAGNOSIS — E119 Type 2 diabetes mellitus without complications: Secondary | ICD-10-CM | POA: Diagnosis not present

## 2015-12-26 DIAGNOSIS — Z79899 Other long term (current) drug therapy: Secondary | ICD-10-CM

## 2015-12-26 DIAGNOSIS — E782 Mixed hyperlipidemia: Secondary | ICD-10-CM

## 2015-12-26 DIAGNOSIS — I10 Essential (primary) hypertension: Secondary | ICD-10-CM | POA: Diagnosis not present

## 2015-12-26 DIAGNOSIS — E559 Vitamin D deficiency, unspecified: Secondary | ICD-10-CM

## 2015-12-26 LAB — CBC WITH DIFFERENTIAL/PLATELET
BASOS PCT: 0 %
Basophils Absolute: 0 cells/uL (ref 0–200)
EOS PCT: 2 %
Eosinophils Absolute: 88 cells/uL (ref 15–500)
HCT: 40 % (ref 35.0–45.0)
HEMOGLOBIN: 13 g/dL (ref 11.7–15.5)
LYMPHS ABS: 2112 {cells}/uL (ref 850–3900)
Lymphocytes Relative: 48 %
MCH: 28 pg (ref 27.0–33.0)
MCHC: 32.5 g/dL (ref 32.0–36.0)
MCV: 86.2 fL (ref 80.0–100.0)
MPV: 9.9 fL (ref 7.5–12.5)
Monocytes Absolute: 352 cells/uL (ref 200–950)
Monocytes Relative: 8 %
NEUTROS ABS: 1848 {cells}/uL (ref 1500–7800)
Neutrophils Relative %: 42 %
Platelets: 323 10*3/uL (ref 140–400)
RBC: 4.64 MIL/uL (ref 3.80–5.10)
RDW: 15.1 % — ABNORMAL HIGH (ref 11.0–15.0)
WBC: 4.4 10*3/uL (ref 3.8–10.8)

## 2015-12-26 LAB — TSH: TSH: 1.8 mIU/L

## 2015-12-26 LAB — HEMOGLOBIN A1C
Hgb A1c MFr Bld: 6.3 % — ABNORMAL HIGH (ref <5.7)
Mean Plasma Glucose: 134 mg/dL

## 2015-12-26 MED ORDER — ERGOCALCIFEROL 1.25 MG (50000 UT) PO CAPS: 50000.0000 [IU] | ORAL_CAPSULE | ORAL | 3 refills | Status: DC

## 2015-12-26 NOTE — Patient Instructions (Signed)
Bupropion; Naltrexone extended-release tablets What is this medicine? BUPROPION; NALTREXONE (byoo PROE pee on; nal TREX one) is a combination product used to promote and maintain weight loss in obese adults or overweight adults who also have weight related medical problems. This medicine should be used with a reduced calorie diet and increased physical activity. This medicine may be used for other purposes; ask your health care provider or pharmacist if you have questions. What should I tell my health care provider before I take this medicine? They need to know if you have any of these conditions: -an eating disorder, such as anorexia or bulimia -diabetes -glaucoma -head injury -heart disease -high blood pressure -history of a drug or alcohol abuse problem -history of a tumor or infection of your brain or spine -history of stroke -history of irregular heartbeat -kidney disease -liver disease -mental illness such as bipolar disorder or psychosis -seizures -suicidal thoughts, plans, or attempt; a previous suicide attempt by you or a family member -an unusual or allergic reaction to bupropion, naltrexone, other medicines, foods, dyes, or preservatives breast-feeding -pregnant or trying to become pregnant How should I use this medicine? Take this medicine by mouth with a glass of water. Follow the directions on the prescription label. Take this medicine in the morning and in the evenings as directed by your healthcare professional. Dennis Bast can take it with or without food. Do not take with high-fat meals as this may increase your risk of seizures. Do not crush, chew, or cut these tablets. Do not take your medicine more often than directed. Do not stop taking this medicine suddenly except upon the advice of your doctor. A special MedGuide will be given to you by the pharmacist with each prescription and refill. Be sure to read this information carefully each time. Talk to your pediatrician  regarding the use of this medicine in children. Special care may be needed. Overdosage: If you think you have taken too much of this medicine contact a poison control center or emergency room at once. NOTE: This medicine is only for you. Do not share this medicine with others. What if I miss a dose? If you miss a dose, skip the missed dose and take your next tablet at the regular time. Do not take double or extra doses. What may interact with this medicine? Do not take this medicine with any of the following medications: -any prescription or street opioid drug like codiene, heroin, methadone -linezolid -MAOIs like Carbex, Eldepryl, Marplan, Nardil, and Parnate -methylene blue (injected into a vein) -other medicines that contain bupropion like Zyban or Wellbutrin This medicine may also interact with the following medications: -alcohol -certain medicines for anxiety or sleep -certain medicines for blood pressure like metoprolol, propranolol -certain medicines for depression or psychotic disturbances -certain medicines for HIV or AIDS like efavirenz, lopinavir, nelfinavir, ritonavir -certain medicines for irregular heart beat like propafenone, flecainide -certain medicines for Parkinson's disease like amantadine, levodopa -certain medicines for seizures like carbamazepine, phenytoin, phenobarbital -cimetidine -clopidogrel -cyclophosphamide -disulfiram -furazolidone -isoniazid -nicotine -orphenadrine -procarbazine -steroid medicines like prednisone or cortisone -stimulant medicines for attention disorders, weight loss, or to stay awake -tamoxifen -theophylline -thioridazine -thiotepa -ticlopidine -tramadol -warfarin This list may not describe all possible interactions. Give your health care provider a list of all the medicines, herbs, non-prescription drugs, or dietary supplements you use. Also tell them if you smoke, drink alcohol, or use illegal drugs. Some items may interact  with your medicine. What should I watch for while using this medicine? This  medicine is intended to be used in addition to a healthy diet and appropriate exercise. The best results are achieved this way. Do not increase or in any way change your dose without consulting your doctor or health care professional. Do not take this medicine with other prescription or over-the-counter weight loss products without consulting your doctor or health care professional. Your doctor should tell you to stop taking this medicine if you do not lose a certain amount of weight within the first 12 weeks of treatment. Visit your doctor or health care professional for regular checkups. Your doctor may order blood tests or other tests to see how you are doing. This medicine may affect blood sugar levels. If you have diabetes, check with your doctor or health care professional before you change your diet or the dose of your diabetic medicine. Patients and their families should watch out for new or worsening depression or thoughts of suicide. Also watch out for sudden changes in feelings such as feeling anxious, agitated, panicky, irritable, hostile, aggressive, impulsive, severely restless, overly excited and hyperactive, or not being able to sleep. If this happens, especially at the beginning of treatment or after a change in dose, call your health care professional. Avoid alcoholic drinks while taking this medicine. Drinking large amounts of alcoholic beverages, using sleeping or anxiety medicines, or quickly stopping the use of these agents while taking this medicine may increase your risk for a seizure. What side effects may I notice from receiving this medicine? Side effects that you should report to your doctor or health care professional as soon as possible: -allergic reactions like skin rash, itching or hives, swelling of the face, lips, or tongue -breathing problems -changes in vision, hearing -chest  pain -confusion -dark urine -depressed mood -fast or irregular heart beat -fever -hallucination, loss of contact with reality -increased blood pressure -light-colored stools -redness, blistering, peeling or loosening of the skin, including inside the mouth -right upper belly pain -seizures -suicidal thoughts or other mood changes -unusually weak or tired -vomiting -yellowing of the eyes or skin Side effects that usually do not require medical attention (Report these to your doctor or health care professional if they continue or are bothersome.): -constipation -diarrhea -dizziness -dry mouth -headache -nausea -trouble sleeping This list may not describe all possible side effects. Call your doctor for medical advice about side effects. You may report side effects to FDA at 1-800-FDA-1088. Where should I keep my medicine? Keep out of the reach of children. Store at room temperature between 15 and 30 degrees C (59 and 86 degrees F). Throw away any unused medicine after the expiration date. NOTE: This sheet is a summary. It may not cover all possible information. If you have questions about this medicine, talk to your doctor, pharmacist, or health care provider.    2016, [employer]. (2012-12-03 15:17:29)

## 2015-12-26 NOTE — Progress Notes (Signed)
Assessment and Plan:  Hypertension:  -Continue medication -monitor blood pressure at home. -Continue DASH diet -Reminder to go to the ER if any CP, SOB, nausea, dizziness, severe HA, changes vision/speech, left arm numbness and tingling and jaw pain.  Cholesterol - Continue diet and exercise -Check cholesterol.   Prediabetes -Continue diet and exercise.  -Check A1C  Vitamin D Def -check level -continue medications.   Morbid obesity -consider the use of contrave and possibly visiting wellness nutritionist through work for diet plan -get buddy for workouts  Continue diet and meds as discussed. Further disposition pending results of labs. Discussed med's effects and SE's.    HPI 55 y.o. female  presents for 3 month follow up with hypertension, hyperlipidemia, diabetes and vitamin D deficiency.   Her blood pressure has been controlled at home, today their BP is BP: 128/82.She does not workout. She denies chest pain, shortness of breath, dizziness.   She is not on cholesterol medication and denies myalgias. Her cholesterol is at goal. The cholesterol was:  09/08/2015: Cholesterol 206; HDL 60; LDL Cholesterol 126; Triglycerides 99   She has been working on diet and exercise for prediabetes without complications, she is not on bASA, she is on ACE/ARB, and denies  foot ulcerations, hyperglycemia, hypoglycemia , increased appetite, nausea, paresthesia of the feet, polydipsia, polyuria, visual disturbances, vomiting and weight loss. Last A1C was: 09/08/2015: Hgb A1c MFr Bld 6.3   Patient is on Vitamin D supplement. No results found for requested labs within last 8760 hours.  She reports that she is very frustrated with her weight.  She is now trying to restart walking.  She has not taken medications for her weight.  She has never met with a dietician.  She reports that she did previously do zumba and really liked it.    Current Medications:  Current Outpatient Prescriptions on File Prior  to Visit  Medication Sig Dispense Refill  . cetirizine (ZYRTEC) 10 MG tablet Take 10 mg by mouth daily as needed for allergies.     Marland Kitchen ergocalciferol (VITAMIN D2) 50000 units capsule Take 1 capsule (50,000 Units total) by mouth 3 (three) times a week. 180 capsule 3  . ranitidine (ZANTAC) 75 MG tablet Take 75 mg by mouth once as needed for heartburn.     . simvastatin (ZOCOR) 10 MG tablet Take 1 tablet (10 mg total) by mouth at bedtime. 90 tablet 2  . valsartan-hydrochlorothiazide (DIOVAN-HCT) 320-25 MG tablet Take 1 tablet by mouth every morning. 90 tablet 2   No current facility-administered medications on file prior to visit.    Medical History:  Past Medical History:  Diagnosis Date  . Allergy   . Hyperlipidemia   . Hypertension   . Obesity   . Type II or unspecified type diabetes mellitus without mention of complication, not stated as uncontrolled    Allergies: No Known Allergies   Review of Systems:  Review of Systems  Constitutional: Negative for chills, fever and malaise/fatigue.  HENT: Negative for congestion, ear pain and sore throat.   Eyes: Negative.   Respiratory: Negative for cough, shortness of breath and wheezing.   Cardiovascular: Negative for chest pain, palpitations and leg swelling.  Gastrointestinal: Negative for abdominal pain, blood in stool, constipation, diarrhea, heartburn and melena.  Genitourinary: Negative.   Skin: Negative.   Neurological: Negative for dizziness, sensory change, loss of consciousness and headaches.  Psychiatric/Behavioral: Negative for depression. The patient is not nervous/anxious and does not have insomnia.     Family  history- Review and unchanged  Social history- Review and unchanged  Physical Exam: BP 128/82   Pulse 80   Temp 98.2 F (36.8 C) (Temporal)   Resp 18   Ht 5' 5.5" (1.664 m)   Wt (!) 327 lb (148.3 kg)   BMI 53.59 kg/m  Wt Readings from Last 3 Encounters:  12/26/15 (!) 327 lb (148.3 kg)  09/08/15 (!) 326 lb  (147.9 kg)  11/26/14 (!) 323 lb (146.5 kg)   General Appearance: Morbidly obese, Well nourished well developed, non-toxic appearing, in no apparent distress. Eyes: PERRLA, EOMs, conjunctiva no swelling or erythema ENT/Mouth: Ear canals clear with no erythema, swelling, or discharge.  TMs normal bilaterally, oropharynx clear, moist, with no exudate.   Neck: Supple, thyroid normal, no JVD, no cervical adenopathy.  Respiratory: Respiratory effort normal, breath sounds clear A&P, no wheeze, rhonchi or rales noted.  No retractions, no accessory muscle usage Cardio: RRR with no MRGs. No noted edema.  Abdomen: Soft, + BS.  Non tender, no guarding, rebound, hernias, masses. Musculoskeletal: Full ROM, 5/5 strength, Normal gait Skin: Warm, dry without rashes, lesions, ecchymosis.  Neuro: Awake and oriented X 3, Cranial nerves intact. No cerebellar symptoms.  Psych: normal affect, Insight and Judgment appropriate.    Starlyn Skeans, PA-C 9:21 AM [facility]

## 2015-12-27 LAB — HEPATIC FUNCTION PANEL
ALT: 22 U/L (ref 6–29)
AST: 24 U/L (ref 10–35)
Albumin: 4 g/dL (ref 3.6–5.1)
Alkaline Phosphatase: 40 U/L (ref 33–130)
BILIRUBIN DIRECT: 0.1 mg/dL (ref <=0.2)
BILIRUBIN INDIRECT: 0.4 mg/dL (ref 0.2–1.2)
Total Bilirubin: 0.5 mg/dL (ref 0.2–1.2)
Total Protein: 6.7 g/dL (ref 6.1–8.1)

## 2015-12-27 LAB — LIPID PANEL
Cholesterol: 187 mg/dL (ref 125–200)
HDL: 53 mg/dL (ref >=46)
LDL CALC: 107 mg/dL (ref <130)
TRIGLYCERIDES: 136 mg/dL (ref <150)
Total CHOL/HDL Ratio: 3.5 Ratio (ref <=5.0)
VLDL: 27 mg/dL (ref <30)

## 2015-12-27 LAB — VITAMIN D 25 HYDROXY (VIT D DEFICIENCY, FRACTURES): Vit D, 25-Hydroxy: 14 ng/mL — ABNORMAL LOW (ref 30–100)

## 2015-12-27 LAB — BASIC METABOLIC PANEL WITH GFR
BUN: 11 mg/dL (ref 7–25)
CALCIUM: 9 mg/dL (ref 8.6–10.4)
CHLORIDE: 106 mmol/L (ref 98–110)
CO2: 27 mmol/L (ref 20–31)
Creat: 0.87 mg/dL (ref 0.50–1.05)
GFR, EST NON AFRICAN AMERICAN: 77 mL/min (ref >=60)
GFR, Est African American: 89 mL/min (ref >=60)
Glucose, Bld: 107 mg/dL — ABNORMAL HIGH (ref 65–99)
Potassium: 4 mmol/L (ref 3.5–5.3)
SODIUM: 145 mmol/L (ref 135–146)

## 2016-04-10 ENCOUNTER — Ambulatory Visit (INDEPENDENT_AMBULATORY_CARE_PROVIDER_SITE_OTHER): Payer: Managed Care, Other (non HMO) | Admitting: Internal Medicine

## 2016-04-10 ENCOUNTER — Encounter: Payer: Self-pay | Admitting: Internal Medicine

## 2016-04-10 VITALS — BP 136/88 | HR 74 | Temp 98.2°F | Resp 18 | Ht 65.5 in | Wt 325.0 lb

## 2016-04-10 DIAGNOSIS — Z79899 Other long term (current) drug therapy: Secondary | ICD-10-CM | POA: Diagnosis not present

## 2016-04-10 DIAGNOSIS — E119 Type 2 diabetes mellitus without complications: Secondary | ICD-10-CM

## 2016-04-10 DIAGNOSIS — E782 Mixed hyperlipidemia: Secondary | ICD-10-CM | POA: Diagnosis not present

## 2016-04-10 DIAGNOSIS — J069 Acute upper respiratory infection, unspecified: Secondary | ICD-10-CM | POA: Diagnosis not present

## 2016-04-10 DIAGNOSIS — I10 Essential (primary) hypertension: Secondary | ICD-10-CM

## 2016-04-10 MED ORDER — PROMETHAZINE-DM 6.25-15 MG/5ML PO SYRP: ORAL_SOLUTION | ORAL | 1 refills | Status: DC

## 2016-04-10 MED ORDER — AZITHROMYCIN 250 MG PO TABS: ORAL_TABLET | ORAL | 0 refills | Status: DC

## 2016-04-10 NOTE — Progress Notes (Signed)
Assessment and Plan:  Hypertension:  -Continue medication,  -monitor blood pressure at home.  -Continue DASH diet.   -Reminder to go to the ER if any CP, SOB, nausea, dizziness, severe HA, changes vision/speech, left arm numbness and tingling, and jaw pain.  Cholesterol: -Continue diet and exercise. .   Pre-diabetes: -Continue diet and exercise.   Vitamin D Def: -continue medications.   Acute URI -flonase -zyrtec -zpak -breo sample  Patient reports that she is on a high deductible plan for her insurance and her last bill was over a 1,000.  She does not have money for labs today.    Continue diet and meds as discussed. Further disposition pending results of labs.  HPI 56 y.o. female  presents for 3 month follow up with hypertension, hyperlipidemia, prediabetes and vitamin D.   Her blood pressure has been controlled at home, today their BP is BP: 136/88.   She does not workout. She denies chest pain, shortness of breath, dizziness.   She is on cholesterol medication and denies myalgias. Her cholesterol is at goal. The cholesterol last visit was:   Lab Results  Component Value Date   CHOL 187 12/26/2015   HDL 53 12/26/2015   LDLCALC 107 12/26/2015   TRIG 136 12/26/2015   CHOLHDL 3.5 12/26/2015     She has been working on diet and exercise for prediabetes, and denies foot ulcerations, hyperglycemia, hypoglycemia , increased appetite, nausea, paresthesia of the feet, polydipsia, polyuria, visual disturbances, vomiting and weight loss. Last A1C in the office was:  Lab Results  Component Value Date   HGBA1C 6.3 (H) 12/26/2015    Patient is on Vitamin D supplement.  Lab Results  Component Value Date   VD25OH 14 (L) 12/26/2015      She reports that she has been having some coughing.  She reports that she had some sinus drainage and since then she has continued to have the dry barky occasional cough.  She reports that she is also doing some wheezing.    Current  Medications:  Current Outpatient Prescriptions on File Prior to Visit  Medication Sig Dispense Refill  . cetirizine (ZYRTEC) 10 MG tablet Take 10 mg by mouth daily as needed for allergies.     Marland Kitchen ergocalciferol (VITAMIN D2) 50000 units capsule Take 1 capsule (50,000 Units total) by mouth 3 (three) times a week. 180 capsule 3  . ranitidine (ZANTAC) 75 MG tablet Take 75 mg by mouth once as needed for heartburn.     . simvastatin (ZOCOR) 10 MG tablet Take 1 tablet (10 mg total) by mouth at bedtime. 90 tablet 2  . valsartan-hydrochlorothiazide (DIOVAN-HCT) 320-25 MG tablet Take 1 tablet by mouth every morning. 90 tablet 2   No current facility-administered medications on file prior to visit.     Medical History:  Past Medical History:  Diagnosis Date  . Allergy   . Hyperlipidemia   . Hypertension   . Obesity   . Type II or unspecified type diabetes mellitus without mention of complication, not stated as uncontrolled     Allergies: No Known Allergies   Review of Systems:  Review of Systems  Constitutional: Negative for chills, fever and malaise/fatigue.  HENT: Positive for congestion. Negative for hearing loss, sinus pain and sore throat.   Eyes: Negative.   Respiratory: Negative for cough, shortness of breath and wheezing.   Cardiovascular: Negative for chest pain, palpitations, orthopnea, leg swelling and PND.  Gastrointestinal: Negative for blood in stool, constipation, diarrhea, heartburn,  melena, nausea and vomiting.  Genitourinary: Negative.   Neurological: Negative for dizziness, tingling and loss of consciousness.  Psychiatric/Behavioral: Negative for depression. The patient is not nervous/anxious and does not have insomnia.     Family history- Review and unchanged  Social history- Review and unchanged  Physical Exam: BP 136/88   Pulse 74   Temp 98.2 F (36.8 C) (Temporal)   Resp 18   Ht 5' 5.5" (1.664 m)   Wt (!) 325 lb (147.4 kg)   BMI 53.26 kg/m  Wt Readings  from Last 3 Encounters:  04/10/16 (!) 325 lb (147.4 kg)  12/26/15 (!) 327 lb (148.3 kg)  09/08/15 (!) 326 lb (147.9 kg)    General Appearance: Well nourished well developed, in no apparent distress. Eyes: PERRLA, EOMs, conjunctiva no swelling or erythema ENT/Mouth: Ear canals normal without obstruction, swelling, erythma, discharge.  TMs normal bilaterally.  Oropharynx moist, clear, without exudate, or postoropharyngeal swelling. Neck: Supple, thyroid normal,no cervical adenopathy  Respiratory: Respiratory effort normal, Breath sounds clear A&P without rhonchi, wheeze, or rale.  No retractions, no accessory usage. Cardio: RRR with no MRGs. Brisk peripheral pulses without edema.  Abdomen: Soft, + BS,  Non tender, no guarding, rebound, hernias, masses. Musculoskeletal: Full ROM, 5/5 strength, Normal gait Skin: Warm, dry without rashes, lesions, ecchymosis.  Neuro: Awake and oriented X 3, Cranial nerves intact. Normal muscle tone, no cerebellar symptoms. Psych: Normal affect, Insight and Judgment appropriate.    Starlyn Skeans, PA-C 9:54 AM [facility]

## 2016-08-02 ENCOUNTER — Ambulatory Visit (INDEPENDENT_AMBULATORY_CARE_PROVIDER_SITE_OTHER): Payer: Managed Care, Other (non HMO) | Admitting: Physician Assistant

## 2016-08-02 ENCOUNTER — Encounter: Payer: Self-pay | Admitting: Physician Assistant

## 2016-08-02 VITALS — BP 136/74 | HR 87 | Temp 97.7°F | Resp 16 | Ht 65.5 in | Wt 325.0 lb

## 2016-08-02 DIAGNOSIS — H9202 Otalgia, left ear: Secondary | ICD-10-CM

## 2016-08-02 MED ORDER — CYCLOBENZAPRINE HCL 10 MG PO TABS
ORAL_TABLET | ORAL | 0 refills | Status: DC
Start: 2016-08-02 — End: 2016-09-12

## 2016-08-02 MED ORDER — FLUTICASONE PROPIONATE 50 MCG/ACT NA SUSP: 2.0000 | Freq: Every day | NASAL | 1 refills | Status: DC

## 2016-08-02 NOTE — Progress Notes (Signed)
Subjective:    Patient ID: [name], female    DOB: Jul 10, 1960, 56 y.o.   MRN: [id number]  HPI 56 y.o. AAF presents with left ear pain/headache x 2 weeks.  Intermittent shooting pain left side, left ear, no fever, chills, took zpak without relief, no blurry vision/changes vision, not worse with touch, no swelling, no teeth issues, last saw dentist Dec, due next month.   Blood pressure 136/74, pulse 87, temperature 97.7 F (36.5 C), resp. rate 16, height 5' 5.5" (1.664 m), weight (!) 325 lb (147.4 kg), SpO2 95 %.  Medications Current Outpatient Prescriptions on File Prior to Visit  Medication Sig  . cetirizine (ZYRTEC) 10 MG tablet Take 10 mg by mouth daily as needed for allergies.   Marland Kitchen ergocalciferol (VITAMIN D2) 50000 units capsule Take 1 capsule (50,000 Units total) by mouth 3 (three) times a week.  . ranitidine (ZANTAC) 75 MG tablet Take 75 mg by mouth once as needed for heartburn.   . simvastatin (ZOCOR) 10 MG tablet Take 1 tablet (10 mg total) by mouth at bedtime.  . valsartan-hydrochlorothiazide (DIOVAN-HCT) 320-25 MG tablet Take 1 tablet by mouth every morning.   No current facility-administered medications on file prior to visit.     Problem list She has Diabetes mellitus without complication ([place]); Hypertension; Hyperlipidemia; Allergy; Morbid obesity ([place]); Colon cancer screening; Benign neoplasm of rectum; and Medication management on her problem list.   Review of Systems  Constitutional: Negative.  Negative for chills, fatigue and fever.  HENT: Positive for ear pain and postnasal drip. Negative for congestion, dental problem, drooling, ear discharge, facial swelling, hearing loss, mouth sores, nosebleeds, rhinorrhea, sinus pressure, sneezing, sore throat, tinnitus and trouble swallowing.   Eyes: Negative.  Negative for visual disturbance.  Respiratory: Negative for apnea, cough, choking, chest tightness, shortness of breath, wheezing and stridor.   Cardiovascular:  Negative.   Genitourinary: Negative.   Neurological: Positive for headaches. Negative for dizziness, tremors, seizures, syncope, facial asymmetry, speech difficulty, weakness, light-headedness and numbness.       Objective:   Physical Exam  Constitutional: She is oriented to person, place, and time. She appears well-developed and well-nourished.  HENT:  Head: Normocephalic and atraumatic.  Right Ear: Hearing and external ear normal. No mastoid tenderness. Tympanic membrane is not injected, not erythematous, not retracted and not bulging. A middle ear effusion is present.  Left Ear: Hearing and external ear normal. No mastoid tenderness. Tympanic membrane is not injected, not erythematous, not retracted and not bulging. A middle ear effusion is present.  + TMJ tenderness bilateral jaw, no abscess, no temple tenderness or scalp tenderness to palpation.   Eyes: Conjunctivae are normal. Pupils are equal, round, and reactive to light.  Neck: Normal range of motion. Neck supple.  Cardiovascular: Normal rate.   Pulmonary/Chest: Effort normal and breath sounds normal.  Musculoskeletal: Normal range of motion.  Neurological: She is alert and oriented to person, place, and time.  Skin: Skin is warm and dry.      Assessment & Plan:   Left ear pain Effusion versus TMJ, nontender scalp/temple so TN and TA doubtful information given to the patient, no gum/decrease hard foods, warm wet wash clothes, decrease stress, talk with dentist about possible night guard, can do massage, and exercise.  -     cyclobenzaprine (FLEXERIL) 10 MG tablet; Take 1 pill nightly as needed for TMJ/neck pain -     fluticasone (FLONASE) 50 MCG/ACT nasal spray; Place 2 sprays into both nostrils  at bedtime.

## 2016-08-02 NOTE — Patient Instructions (Signed)
Your ears and sinuses are connected by the eustachian tube. When your sinuses are inflamed, this can close off the tube and cause fluid to collect in your middle ear. This can then cause dizziness, popping, clicking, ringing, and echoing in your ears. This is often NOT an infection and does NOT require antibiotics, it is caused by inflammation so the treatments help the inflammation. This can take a long time to get better so please be patient.  Here are things you can do to help with this: - Try the Flonase or Nasonex. Remember to spray each nostril twice towards the outer part of your eye.  Do not sniff but instead pinch your nose and tilt your head back to help the medicine get into your sinuses.  The best time to do this is at bedtime.Stop if you get blurred vision or nose bleeds.  -While drinking fluids, pinch and hold nose close and swallow, to help open eustachian tubes to drain fluid behind ear drums. -Please pick one of the over the counter allergy medications below and take it once daily for allergies.  It will also help with fluid behind ear drums. Claritin or loratadine cheapest but likely the weakest  Zyrtec or certizine at night because it can make you sleepy The strongest is allegra or fexafinadine  Cheapest at [employer], [employer], [employer] -can use decongestant over the counter, please do not use if you have high blood pressure or certain heart conditions.   if worsening HA, changes vision/speech, imbalance, weakness go to the ER   What is the TMJ? The temporomandibular ([id number]-man-DIB-[id number]-ler) joint, or the TMJ, connects the upper and lower jawbones. This joint allows the jaw to open wide and move back and forth when you chew, talk, or yawn.There are also several muscles that help this joint move. There can be muscle tightness and pain in the muscle that can cause several symptoms.  What causes TMJ pain? There are many causes of TMJ pain. Repeated chewing (for example, chewing gum)  and clenching your teeth can cause pain in the joint. Some TMJ pain has no obvious cause. What can I do to ease the pain? There are many things you can do to help your pain get better. When you have pain:  Eat soft foods and stay away from chewy foods (for example, taffy) Try to use both sides of your mouth to chew Don't chew gum Massage Don't open your mouth wide (for example, during yawning or singing) Don't bite your cheeks or fingernails Lower your amount of stress and worry Applying a warm, damp washcloth to the joint may help. Over-the-counter pain medicines such as ibuprofen (one brand: Advil) or acetaminophen (one brand: Tylenol) might also help. Do not use these medicines if you are allergic to them or if your doctor told you not to use them. How can I stop the pain from coming back? When your pain is better, you can do these exercises to make your muscles stronger and to keep the pain from coming back:  Resisted mouth opening: Place your thumb or two fingers under your chin and open your mouth slowly, pushing up lightly on your chin with your thumb. Hold for three to six seconds. Close your mouth slowly. Resisted mouth closing: Place your thumbs under your chin and your two index fingers on the ridge between your mouth and the bottom of your chin. Push down lightly on your chin as you close your mouth. Tongue up: Slowly open and close your mouth while   keeping the tongue touching the roof of the mouth. Side-to-side jaw movement: Place an object about one fourth of an inch thick (for example, two tongue depressors) between your front teeth. Slowly move your jaw from side to side. Increase the thickness of the object as the exercise becomes easier Forward jaw movement: Place an object about one fourth of an inch thick between your front teeth and move the bottom jaw forward so that the bottom teeth are in front of the top teeth. Increase the thickness of the object as the exercise becomes  easier. These exercises should not be painful. If it hurts to do these exercises, stop doing them and talk to your family doctor.    

## 2016-08-27 ENCOUNTER — Encounter: Payer: Self-pay | Admitting: Internal Medicine

## 2016-09-07 ENCOUNTER — Encounter: Payer: Self-pay | Admitting: Internal Medicine

## 2016-09-12 ENCOUNTER — Other Ambulatory Visit: Payer: Self-pay | Admitting: Physician Assistant

## 2016-09-12 DIAGNOSIS — H9202 Otalgia, left ear: Secondary | ICD-10-CM

## 2016-09-24 NOTE — Progress Notes (Signed)
Assessment and Plan:   Essential hypertension - continue medications, DASH diet, exercise and monitor at home. Call if greater than 130/80.  -     CBC with Differential/Platelet -     BASIC METABOLIC PANEL WITH GFR -     Hepatic function panel -     TSH -     Urinalysis, Routine w reflex microscopic -     Microalbumin / creatinine urine ratio -     EKG 12-Lead -     valsartan-hydrochlorothiazide (DIOVAN-HCT) 320-25 MG tablet; Take 1 tablet by mouth every morning.  Diabetes mellitus without complication (HCC) -     Hemoglobin A1c  Mixed hyperlipidemia -     Lipid panel -     simvastatin (ZOCOR) 10 MG tablet; Take 1 tablet (10 mg total) by mouth at bedtime.  Morbid Obesity with co morbidities - long discussion about weight loss, diet, and exercise  Medication management -     Magnesium  Left ear pain -     cyclobenzaprine (FLEXERIL) 10 MG tablet; TAKE 1 PILL NIGHTLY AS NEEDED FOR TMJ/NECK PAIN -     fluticasone (FLONASE) 50 MCG/ACT nasal spray; Place 2 sprays into both nostrils at bedtime.  Vitamin D deficiency -     VITAMIN D 25 Hydroxy (Vit-D Deficiency, Fractures)  Anemia, unspecified type -     Iron and TIBC -     Vitamin B12  Gastroesophageal reflux disease without esophagitis -     ranitidine (ZANTAC) 75 MG tablet; Take 1 tablet (75 mg total) by mouth once as needed for heartburn.     Continue diet and meds as discussed. Further disposition pending results of labs. OVER 30 minutes of exam, counseling, chart review, referral performed  HPI 56 y.o. female  presents for 3 month follow up with hypertension, hyperlipidemia, prediabetes and vitamin D.  Her blood pressure has been controlled at home, today their BP is BP: 128/76  She does not workout but is trying to park farther away and walk more. She denies chest pain, shortness of breath, dizziness.  She is on cholesterol medication and denies myalgias. Her cholesterol is not at goal.  Lab Results  Component Value  Date   CHOL 187 12/26/2015   HDL 53 12/26/2015   LDLCALC 107 12/26/2015   TRIG 136 12/26/2015   CHOLHDL 3.5 12/26/2015    She has not been working on diet and exercise for prediabetes, and denies foot ulcerations, hyperglycemia, nausea, paresthesia of the feet, polydipsia, polyuria, visual disturbances and vomiting Lab Results  Component Value Date   HGBA1C 6.3 (H) 12/26/2015   Patient is on Vitamin D supplement.   Lab Results  Component Value Date   VD25OH 14 (L) 12/26/2015     BMI is Body mass index is 52.87 kg/m., she is working on diet and exercise. Hx of negative sleep study 10 yrs ago.  Does snore per family reports. Wt Readings from Last 3 Encounters:  09/25/16 (!) 322 lb 9.6 oz (146.3 kg)  08/02/16 (!) 325 lb (147.4 kg)  04/10/16 (!) 325 lb (147.4 kg)     Current Medications:  Current Outpatient Prescriptions on File Prior to Visit  Medication Sig Dispense Refill  . cetirizine (ZYRTEC) 10 MG tablet Take 10 mg by mouth daily as needed for allergies.     . cyclobenzaprine (FLEXERIL) 10 MG tablet TAKE 1 PILL NIGHTLY AS NEEDED FOR TMJ/NECK PAIN 30 tablet 0  . ergocalciferol (VITAMIN D2) 50000 units capsule Take 1 capsule (50,000  Units total) by mouth 3 (three) times a week. 180 capsule 3  . fluticasone (FLONASE) 50 MCG/ACT nasal spray Place 2 sprays into both nostrils at bedtime. 16 g 1  . ranitidine (ZANTAC) 75 MG tablet Take 75 mg by mouth once as needed for heartburn.     . simvastatin (ZOCOR) 10 MG tablet Take 1 tablet (10 mg total) by mouth at bedtime. 90 tablet 2  . valsartan-hydrochlorothiazide (DIOVAN-HCT) 320-25 MG tablet Take 1 tablet by mouth every morning. 90 tablet 2   No current facility-administered medications on file prior to visit.    Immunization History  Administered Date(s) Administered  . Influenza,inj,quad, With Preservative 12/26/2015  . Influenza-Unspecified 01/28/2014  . PPD Test 08/19/2013  . Td 09/06/2003  . Tdap 08/19/2013    Tetanus:  2015 Pap: 04/2016 with GYN MGM: 04/2016 with GYN Colonoscopy: 2015  Last Eye Exam:  2017  Patient Care Team: Unk Pinto, MD as PCP - General (Internal Medicine) Molli Posey, MD as Consulting Physician (Obstetrics and Gynecology)  Medical History:  Past Medical History:  Diagnosis Date  . Allergy   . Hyperlipidemia   . Hypertension   . Obesity   . Type II or unspecified type diabetes mellitus without mention of complication, not stated as uncontrolled    Allergies No Known Allergies  SURGICAL HISTORY She  has a past surgical history that includes Cesarean section; Umbilical hernia repair; Tubal ligation; and Colonoscopy with propofol (N/A, 12/15/2013). FAMILY HISTORY Her family history includes Alcohol abuse in her mother; Cancer in her father; Cancer (age of onset: 19) in her sister; Diabetes in her paternal grandmother and son; Hypertension in her mother. SOCIAL HISTORY She  reports that she has never smoked. She has never used smokeless tobacco. She reports that she does not drink alcohol or use drugs.  Review of Systems:  Review of Systems  Constitutional: Negative for chills, diaphoresis, fever, malaise/fatigue and weight loss.  HENT: Negative.   Respiratory: Negative.   Cardiovascular: Negative.   Gastrointestinal: Negative.   Genitourinary: Negative for dysuria, flank pain, frequency, hematuria and urgency.  Musculoskeletal: Negative for back pain, falls, myalgias and neck pain.  Skin: Negative.   Neurological: Negative for weakness.  Psychiatric/Behavioral: Negative.    Physical Exam: BP 128/76   Pulse 78   Temp (!) 97.3 F (36.3 C)   Resp 14   Ht 5' 5.5" (1.664 m)   Wt (!) 322 lb 9.6 oz (146.3 kg)   SpO2 98%   BMI 52.87 kg/m  Wt Readings from Last 3 Encounters:  09/25/16 (!) 322 lb 9.6 oz (146.3 kg)  08/02/16 (!) 325 lb (147.4 kg)  04/10/16 (!) 325 lb (147.4 kg)   General Appearance: Well nourished, in no apparent distress. Eyes: PERRLA, EOMs,  conjunctiva no swelling or erythema Sinuses: No Frontal/maxillary tenderness ENT/Mouth: Ext aud canals clear, TMs without erythema, bulging. No erythema, swelling, or exudate on post pharynx.  Tonsils not swollen or erythematous. Hearing normal.  Neck: Supple, thyroid normal.  Respiratory: Respiratory effort normal, decreased breath sounds due to body habitus, BS equal bilaterally without rales, rhonchi, wheezing or stridor.  Cardio: RRR, decreased HR sounds due to body habitus, with systolic murmur. Brisk peripheral pulses with 1+ edema.  Abdomen: Soft, + BS, morbidly obese, Non tender, no guarding, rebound, hernias, masses. Lymphatics: Non tender without lymphadenopathy.  Musculoskeletal: Full ROM, 5/5 strength, antalgic gait due to body habitus, Patient is able to ambulate well.   Skin: Warm, dry without rashes, lesions, ecchymosis.  Neuro:  Cranial nerves intact. Normal muscle tone, no cerebellar symptoms. Psych: Awake and oriented X 3, normal affect, Insight and Judgment appropriate.    Vicie Mutters, PA-C 11:41 AM [facility]

## 2016-09-25 ENCOUNTER — Ambulatory Visit (INDEPENDENT_AMBULATORY_CARE_PROVIDER_SITE_OTHER): Payer: Managed Care, Other (non HMO) | Admitting: Physician Assistant

## 2016-09-25 ENCOUNTER — Encounter: Payer: Self-pay | Admitting: Physician Assistant

## 2016-09-25 VITALS — BP 128/76 | HR 78 | Temp 97.3°F | Resp 14 | Ht 65.5 in | Wt 322.6 lb

## 2016-09-25 DIAGNOSIS — Z0000 Encounter for general adult medical examination without abnormal findings: Secondary | ICD-10-CM | POA: Diagnosis not present

## 2016-09-25 DIAGNOSIS — I10 Essential (primary) hypertension: Secondary | ICD-10-CM | POA: Diagnosis not present

## 2016-09-25 DIAGNOSIS — E119 Type 2 diabetes mellitus without complications: Secondary | ICD-10-CM

## 2016-09-25 DIAGNOSIS — K219 Gastro-esophageal reflux disease without esophagitis: Secondary | ICD-10-CM

## 2016-09-25 DIAGNOSIS — Z136 Encounter for screening for cardiovascular disorders: Secondary | ICD-10-CM

## 2016-09-25 DIAGNOSIS — Z79899 Other long term (current) drug therapy: Secondary | ICD-10-CM

## 2016-09-25 DIAGNOSIS — E559 Vitamin D deficiency, unspecified: Secondary | ICD-10-CM

## 2016-09-25 DIAGNOSIS — H9202 Otalgia, left ear: Secondary | ICD-10-CM

## 2016-09-25 DIAGNOSIS — D649 Anemia, unspecified: Secondary | ICD-10-CM

## 2016-09-25 DIAGNOSIS — E782 Mixed hyperlipidemia: Secondary | ICD-10-CM

## 2016-09-25 LAB — HEPATIC FUNCTION PANEL
ALT: 21 U/L (ref 6–29)
AST: 23 U/L (ref 10–35)
Albumin: 4.2 g/dL (ref 3.6–5.1)
Alkaline Phosphatase: 51 U/L (ref 33–130)
BILIRUBIN DIRECT: 0.1 mg/dL (ref <=0.2)
BILIRUBIN INDIRECT: 0.4 mg/dL (ref 0.2–1.2)
Total Bilirubin: 0.5 mg/dL (ref 0.2–1.2)
Total Protein: 6.9 g/dL (ref 6.1–8.1)

## 2016-09-25 LAB — LIPID PANEL
CHOL/HDL RATIO: 4.1 ratio (ref <5.0)
Cholesterol: 232 mg/dL — ABNORMAL HIGH (ref <200)
HDL: 56 mg/dL (ref >50)
LDL CALC: 151 mg/dL — AB (ref <100)
Triglycerides: 127 mg/dL (ref <150)
VLDL: 25 mg/dL (ref <30)

## 2016-09-25 LAB — CBC WITH DIFFERENTIAL/PLATELET
Basophils Absolute: 0 cells/uL (ref 0–200)
Basophils Relative: 0 %
EOS PCT: 2 %
Eosinophils Absolute: 94 cells/uL (ref 15–500)
HCT: 42.1 % (ref 35.0–45.0)
Hemoglobin: 13.7 g/dL (ref 11.7–15.5)
LYMPHS ABS: 2538 {cells}/uL (ref 850–3900)
Lymphocytes Relative: 54 %
MCH: 28.5 pg (ref 27.0–33.0)
MCHC: 32.5 g/dL (ref 32.0–36.0)
MCV: 87.7 fL (ref 80.0–100.0)
MONOS PCT: 8 %
MPV: 10.2 fL (ref 7.5–12.5)
Monocytes Absolute: 376 cells/uL (ref 200–950)
NEUTROS PCT: 36 %
Neutro Abs: 1692 cells/uL (ref 1500–7800)
PLATELETS: 348 10*3/uL (ref 140–400)
RBC: 4.8 MIL/uL (ref 3.80–5.10)
RDW: 14.6 % (ref 11.0–15.0)
WBC: 4.7 10*3/uL (ref 3.8–10.8)

## 2016-09-25 LAB — BASIC METABOLIC PANEL WITH GFR
BUN: 9 mg/dL (ref 7–25)
CALCIUM: 8.8 mg/dL (ref 8.6–10.4)
CO2: 27 mmol/L (ref 20–31)
CREATININE: 0.91 mg/dL (ref 0.50–1.05)
Chloride: 104 mmol/L (ref 98–110)
GFR, EST AFRICAN AMERICAN: 83 mL/min (ref >=60)
GFR, EST NON AFRICAN AMERICAN: 72 mL/min (ref >=60)
Glucose, Bld: 102 mg/dL — ABNORMAL HIGH (ref 65–99)
Potassium: 3.6 mmol/L (ref 3.5–5.3)
Sodium: 144 mmol/L (ref 135–146)

## 2016-09-25 LAB — IRON AND TIBC
%SAT: 21 % (ref 11–50)
Iron: 71 ug/dL (ref 45–160)
TIBC: 335 ug/dL (ref 250–450)
UIBC: 264 ug/dL

## 2016-09-25 MED ORDER — VALSARTAN-HYDROCHLOROTHIAZIDE 320-25 MG PO TABS: 1.0000 | ORAL_TABLET | Freq: Every morning | ORAL | 2 refills | Status: DC

## 2016-09-25 MED ORDER — SIMVASTATIN 10 MG PO TABS: 10.0000 mg | ORAL_TABLET | Freq: Every day | ORAL | 2 refills | Status: DC

## 2016-09-25 MED ORDER — CYCLOBENZAPRINE HCL 10 MG PO TABS: ORAL_TABLET | ORAL | 1 refills | Status: DC

## 2016-09-25 MED ORDER — RANITIDINE HCL 75 MG PO TABS: 75.0000 mg | ORAL_TABLET | Freq: Once | ORAL | 1 refills | Status: DC | PRN

## 2016-09-25 MED ORDER — FLUTICASONE PROPIONATE 50 MCG/ACT NA SUSP: 2.0000 | Freq: Every day | NASAL | 1 refills | Status: DC

## 2016-09-25 NOTE — Patient Instructions (Addendum)
Call pharmacy and ask if from recall from FDA if your lot numbers were affected for your valsartan  Drink 80-100 oz a day of water, measure it out Eat 3 meals a day, have to do breakfast, eat protein- hard boiled eggs, protein bar like nature valley protein bar, greek yogurt like oikos triple zero, chobani 100, or light n fit greek   We want you on at least 5000 IU daily or can do 50,000 IU take daily for 4 weeks then go to 3 x a week  Vitamin D goal is between 60-80  Please make sure that you are taking your Vitamin D as directed.   It is very important as a natural anti-inflammatory   helping hair, skin, and nails, as well as reducing stroke and heart attack risk.   It helps your bones and helps with mood. Will help with your energy.   It also decreases numerous cancer risks so please take it as directed.   Low Vit D is associated with a 200-300% higher risk for CANCER   and 200-300% higher risk for HEART   ATTACK  &  STROKE.    .....................................Marland Kitchen  It is also associated with higher death rate at younger ages,   autoimmune diseases like Rheumatoid arthritis, Lupus, Multiple Sclerosis.     Also many other serious conditions, like depression, Alzheimer's  Dementia, infertility, muscle aches, fatigue, fibromyalgia - just to name a few.  +++++++++++++++++++  Can get liquid vitamin D from [facility] here in [place] at  [facility] [address] [place], [place] [address] Or you can try earth fare    Simple math prevails.    1st - exercise does not produce significant weight loss - at best one converts fat into muscle , "bulks up", loses inches, but usually stays "weight neutral"     2nd - think of your body weightas a check book: If you eat more calories than you burn up - you save money or gain weight .... Or if you spend more money than you put in the check book, ie burn up more calories than you eat, then you lose weight     3rd - if you  walk or run 1 mile, you burn up 100 calories - you have to burn up 3,500 calories to lose 1 pound, ie you have to walk/run 35 miles to lose 1 measly pound. So if you want to lose 10 #, then you have to walk/run 350 miles, so.... clearly exercise is not the solution.     4. So if you consume 1,500 calories, then you have to burn up the equivalent of 15 miles to stay weight neutral - It also stands to reason that if you consume 1,500 cal/day and don't lose weight, then you must be burning up about 1,500 cals/day to stay weight neutral.     5. If you really want to lose weight, you must cut your calorie intake 300 calories /day and at that rate you should lose about 1 # every 3 days.   6. Please purchase Dr Fara Olden Fuhrman's book(s) "The End of Dieting" & "Eat to Live" . It has some great concepts and recipes.    I think it is possible that you have sleep apnea. It can cause interrupted sleep, headaches, frequent awakenings, fatigue, dry mouth, fast/slow heart beats, memory issues, anxiety/depression, swelling, numbness tingling hands/feet, weight gain, shortness of breath, and the list goes on. Sleep apnea needs to be ruled out because  if it is left untreated it does eventually lead to abnormal heart beats, lung failure or heart failure as well as increasing the risk of heart attack and stroke. There are masks you can wear OR a mouth piece that I can give you information about. Often times though people feel MUCH better after getting treatment.   Sleep Apnea  Sleep apnea is a sleep disorder characterized by abnormal pauses in breathing while you sleep. When your breathing pauses, the level of oxygen in your blood decreases. This causes you to move out of deep sleep and into light sleep. As a result, your quality of sleep is poor, and the system that carries your blood throughout your body (cardiovascular system) experiences stress. If sleep apnea remains untreated, the following conditions can develop:   High blood pressure (hypertension).  Coronary artery disease.  Inability to achieve or maintain an erection (impotence).  Impairment of your thought process (cognitive dysfunction). There are three types of sleep apnea: 1. Obstructive sleep apnea--Pauses in breathing during sleep because of a blocked airway. 2. Central sleep apnea--Pauses in breathing during sleep because the area of the brain that controls your breathing does not send the correct signals to the muscles that control breathing. 3. Mixed sleep apnea--A combination of both obstructive and central sleep apnea.  RISK FACTORS The following risk factors can increase your risk of developing sleep apnea:  Being overweight.  Smoking.  Having narrow passages in your nose and throat.  Being of older age.  Being female.  Alcohol use.  Sedative and tranquilizer use.  Ethnicity. Among individuals younger than 35 years, African Americans are at increased risk of sleep apnea. SYMPTOMS   Difficulty staying asleep.  Daytime sleepiness and fatigue.  Loss of energy.  Irritability.  Loud, heavy snoring.  Morning headaches.  Trouble concentrating.  Forgetfulness.  Decreased interest in sex. DIAGNOSIS  In order to diagnose sleep apnea, your caregiver will perform a physical examination. Your caregiver may suggest that you take a home sleep test. Your caregiver may also recommend that you spend the night in a sleep lab. In the sleep lab, several monitors record information about your heart, lungs, and brain while you sleep. Your leg and arm movements and blood oxygen level are also recorded. TREATMENT The following actions may help to resolve mild sleep apnea:  Sleeping on your side.   Using a decongestant if you have nasal congestion.   Avoiding the use of depressants, including alcohol, sedatives, and narcotics.   Losing weight and modifying your diet if you are overweight. There also are devices and treatments  to help open your airway:  Oral appliances. These are custom-made mouthpieces that shift your lower jaw forward and slightly open your bite. This opens your airway.  Devices that create positive airway pressure. This positive pressure "splints" your airway open to help you breathe better during sleep. The following devices create positive airway pressure:  Continuous positive airway pressure (CPAP) device. The CPAP device creates a continuous level of air pressure with an air pump. The air is delivered to your airway through a mask while you sleep. This continuous pressure keeps your airway open.  Nasal expiratory positive airway pressure (EPAP) device. The EPAP device creates positive air pressure as you exhale. The device consists of single-use valves, which are inserted into each nostril and held in place by adhesive. The valves create very little resistance when you inhale but create much more resistance when you exhale. That increased resistance creates the positive  airway pressure. This positive pressure while you exhale keeps your airway open, making it easier to breath when you inhale again.  Bilevel positive airway pressure (BPAP) device. The BPAP device is used mainly in patients with central sleep apnea. This device is similar to the CPAP device because it also uses an air pump to deliver continuous air pressure through a mask. However, with the BPAP machine, the pressure is set at two different levels. The pressure when you exhale is lower than the pressure when you inhale.  Surgery. Typically, surgery is only done if you cannot comply with less invasive treatments or if the less invasive treatments do not improve your condition. Surgery involves removing excess tissue in your airway to create a wider passage way. Document Released: 02/16/2002 Document Revised: 06/23/2012 Document Reviewed: 07/05/2011 [facility] Patient Information 2015 [place], [place]. This information is not intended to  replace advice given to you by your health care provider. Make sure you discuss any questions you have with your health care provider.

## 2016-09-26 LAB — URINALYSIS, ROUTINE W REFLEX MICROSCOPIC
BILIRUBIN URINE: NEGATIVE
GLUCOSE, UA: NEGATIVE
Hgb urine dipstick: NEGATIVE
KETONES UR: NEGATIVE
Leukocytes, UA: NEGATIVE
Nitrite: NEGATIVE
PH: 5.5 (ref 5.0–8.0)
Protein, ur: NEGATIVE
SPECIFIC GRAVITY, URINE: 1.02 (ref 1.001–1.035)

## 2016-09-26 LAB — VITAMIN B12: VITAMIN B 12: 394 pg/mL (ref 200–1100)

## 2016-09-26 LAB — MICROALBUMIN / CREATININE URINE RATIO
Creatinine, Urine: 243 mg/dL (ref 20–320)
MICROALB UR: 1.7 mg/dL
Microalb Creat Ratio: 7 mcg/mg creat (ref <30)

## 2016-09-26 LAB — HEMOGLOBIN A1C
HEMOGLOBIN A1C: 6.1 % — AB (ref <5.7)
Mean Plasma Glucose: 128 mg/dL

## 2016-09-26 LAB — TSH: TSH: 2.39 mIU/L

## 2016-09-26 LAB — MAGNESIUM: Magnesium: 1.9 mg/dL (ref 1.5–2.5)

## 2016-09-26 LAB — VITAMIN D 25 HYDROXY (VIT D DEFICIENCY, FRACTURES): Vit D, 25-Hydroxy: 15 ng/mL — ABNORMAL LOW (ref 30–100)

## 2016-09-26 NOTE — Progress Notes (Signed)
Pt aware of lab results & voiced understanding of those results.

## 2017-02-22 ENCOUNTER — Ambulatory Visit (INDEPENDENT_AMBULATORY_CARE_PROVIDER_SITE_OTHER): Payer: 59 | Admitting: Physician Assistant

## 2017-02-22 ENCOUNTER — Encounter: Payer: Self-pay | Admitting: Physician Assistant

## 2017-02-22 VITALS — BP 136/80 | HR 72 | Temp 97.5°F | Resp 16 | Ht 65.5 in | Wt 328.0 lb

## 2017-02-22 DIAGNOSIS — E559 Vitamin D deficiency, unspecified: Secondary | ICD-10-CM | POA: Diagnosis not present

## 2017-02-22 DIAGNOSIS — R7303 Prediabetes: Secondary | ICD-10-CM

## 2017-02-22 DIAGNOSIS — I10 Essential (primary) hypertension: Secondary | ICD-10-CM

## 2017-02-22 DIAGNOSIS — E782 Mixed hyperlipidemia: Secondary | ICD-10-CM

## 2017-02-22 DIAGNOSIS — H9202 Otalgia, left ear: Secondary | ICD-10-CM | POA: Diagnosis not present

## 2017-02-22 DIAGNOSIS — Z79899 Other long term (current) drug therapy: Secondary | ICD-10-CM

## 2017-02-22 MED ORDER — PHENTERMINE HCL 37.5 MG PO TABS: 37.5000 mg | ORAL_TABLET | Freq: Every day | ORAL | 2 refills | Status: DC

## 2017-02-22 NOTE — Progress Notes (Signed)
Assessment and Plan:   Essential hypertension - continue medications, DASH diet, exercise and monitor at home. Call if greater than 130/80.  -     CBC with Differential/Platelet -     BASIC METABOLIC PANEL WITH GFR -     Hepatic function panel -     TSH -     Urinalysis, Routine w reflex microscopic -     Microalbumin / creatinine urine ratio -     EKG 12-Lead -     valsartan-hydrochlorothiazide (DIOVAN-HCT) 320-25 MG tablet; Take 1 tablet by mouth every morning.  Diabetes mellitus without complication ([place]) Discussed general issues about diabetes pathophysiology and management., Educational material distributed., Suggested low cholesterol diet., Encouraged aerobic exercise., Discussed foot care., Reminded to get yearly retinal exam. -     Hemoglobin A1c  Mixed hyperlipidemia -     Lipid panel -continue medications, check lipids, decrease fatty foods, increase activity.  -     simvastatin (ZOCOR) 10 MG tablet; Take 1 tablet (10 mg total) by mouth at bedtime.  Morbid Obesity with co morbidities - - follow up 1 month for progress monitoring - long discussion about weight loss, diet, and exercise, will start the patient on phentermine- hand out given and AE's discussed, will do close follow up.   Medication management -     Magnesium  Vitamin D deficiency -     VITAMIN D 25 Hydroxy (Vit-D Deficiency, Fractures)  Left ear pain Likely effusion versus TMJ Information given  Continue diet and meds as discussed. Further disposition pending results of labs. OVER 30 minutes of exam, counseling, chart review, referral performed  HPI 56 y.o. female  presents for 3 month follow up with hypertension, hyperlipidemia, prediabetes and vitamin D.  Ear popping x 1 week, no fever, chills. Some sinus congestion/drainage, coughing at night. She is on zyrtec but not flonase. Also has history of TMJ and has had some headaches.    Her blood pressure has been controlled at home, today their BP is  BP: 136/80  She does not workout but is trying to park farther away and walk more. She denies chest pain, shortness of breath, dizziness.  She is on cholesterol medication and denies myalgias. Her cholesterol is not at goal.  Lab Results  Component Value Date   CHOL 232 (H) 09/25/2016   HDL 56 09/25/2016   LDLCALC 151 (H) 09/25/2016   TRIG 127 09/25/2016   CHOLHDL 4.1 09/25/2016    She has not been working on diet and exercise for prediabetes, and denies foot ulcerations, hyperglycemia, nausea, paresthesia of the feet, polydipsia, polyuria, visual disturbances and vomiting Lab Results  Component Value Date   HGBA1C 6.1 (H) 09/25/2016   Patient is on Vitamin D supplement, 50,000 QD.   Lab Results  Component Value Date   VD25OH 15 (L) 09/25/2016     BMI is Body mass index is 53.75 kg/m., she is working on diet and exercise. Hx of negative sleep study 10 yrs ago.  She states she is struggling with the holidays and would like to add a medication.   Eats ice cream at night Breakfast grits, oatmeal, eggs with bacon or sausage.  Lunch sometimes she will or will not eat lunch Dinner: Chicken, steak, pork chops,  Potato, rice, spinach. Will fry occ.  Wt Readings from Last 3 Encounters:  02/22/17 (!) 328 lb (148.8 kg)  09/25/16 (!) 322 lb 9.6 oz (146.3 kg)  08/02/16 (!) 325 lb (147.4 kg)  Current Medications:  Current Outpatient Medications on File Prior to Visit  Medication Sig Dispense Refill  . cetirizine (ZYRTEC) 10 MG tablet Take 10 mg by mouth daily as needed for allergies.     . cyclobenzaprine (FLEXERIL) 10 MG tablet TAKE 1 PILL NIGHTLY AS NEEDED FOR TMJ/NECK PAIN 90 tablet 1  . ergocalciferol (VITAMIN D2) 50000 units capsule Take 1 capsule (50,000 Units total) by mouth 3 (three) times a week. 180 capsule 3  . fluticasone (FLONASE) 50 MCG/ACT nasal spray Place 2 sprays into both nostrils at bedtime. 16 g 1  . ranitidine (ZANTAC) 75 MG tablet Take 1 tablet (75 mg total) by  mouth once as needed for heartburn. 90 tablet 1  . simvastatin (ZOCOR) 10 MG tablet Take 1 tablet (10 mg total) by mouth at bedtime. 90 tablet 2  . valsartan-hydrochlorothiazide (DIOVAN-HCT) 320-25 MG tablet Take 1 tablet by mouth every morning. 90 tablet 2   No current facility-administered medications on file prior to visit.     Medical History:  Past Medical History:  Diagnosis Date  . Allergy   . Hyperlipidemia   . Hypertension   . Obesity   . Type II or unspecified type diabetes mellitus without mention of complication, not stated as uncontrolled    Allergies No Known Allergies  Surgical History: reviewed and unchanged Family History: reviewed and unchanged Social History: reviewed and unchanged  Review of Systems:  Review of Systems  Constitutional: Negative for chills, diaphoresis, fever, malaise/fatigue and weight loss.  HENT: Positive for ear pain. Negative for congestion, ear discharge, hearing loss, nosebleeds, sinus pain, sore throat and tinnitus.   Respiratory: Negative.  Negative for stridor.   Cardiovascular: Negative.   Gastrointestinal: Negative.   Genitourinary: Negative for dysuria, flank pain, frequency, hematuria and urgency.  Musculoskeletal: Negative for back pain, falls, myalgias and neck pain.  Skin: Negative.   Neurological: Negative for weakness.  Psychiatric/Behavioral: Negative.    Physical Exam: BP 136/80   Pulse 72   Temp (!) 97.5 F (36.4 C)   Resp 16   Ht 5' 5.5" (1.664 m)   Wt (!) 328 lb (148.8 kg)   SpO2 97%   BMI 53.75 kg/m  Wt Readings from Last 3 Encounters:  02/22/17 (!) 328 lb (148.8 kg)  09/25/16 (!) 322 lb 9.6 oz (146.3 kg)  08/02/16 (!) 325 lb (147.4 kg)   General Appearance: Well nourished, in no apparent distress. Eyes: PERRLA, EOMs, conjunctiva no swelling or erythema Sinuses: No Frontal/maxillary tenderness ENT/Mouth: Ext aud canals clear, TMs without erythema, bulging. No erythema, swelling, or exudate on post  pharynx.  Tonsils not swollen or erythematous. Hearing normal.  Neck: Supple, thyroid normal.  Respiratory: Respiratory effort normal, decreased breath sounds due to body habitus, BS equal bilaterally without rales, rhonchi, wheezing or stridor.  Cardio: RRR, decreased HR sounds due to body habitus, with systolic murmur. Brisk peripheral pulses with 1+ edema.  Abdomen: Soft, + BS, morbidly obese, Non tender, no guarding, rebound, hernias, masses. Lymphatics: Non tender without lymphadenopathy.  Musculoskeletal: Full ROM, 5/5 strength, antalgic gait due to body habitus, Patient is able to ambulate well.   Skin: Warm, dry without rashes, lesions, ecchymosis.  Neuro: Cranial nerves intact. Normal muscle tone, no cerebellar symptoms. Psych: Awake and oriented X 3, normal affect, Insight and Judgment appropriate.    Vicie Mutters, PA-C 10:59 AM [facility]

## 2017-02-22 NOTE — Patient Instructions (Signed)
Being dehydrated can hurt your kidneys, cause fatigue, headaches, muscle aches, joint pain, and dry skin/nails so please increase your fluids.   Drink 80-100 oz a day of water, measure it out! Eat 3 meals a day, have to do breakfast, eat protein- hard boiled eggs, protein bar like nature valley protein bar, greek yogurt like oikos triple zero, chobani 100, or light n fit greek  Can check out plantnanny app on your phone to help you keep track of your water   We want weight loss that will last so you should lose 1-2 pounds a week.  THAT IS IT! Please pick THREE things a month to change. Once it is a habit check off the item. Then pick another three items off the list to become habits.  If you are already doing a habit on the list GREAT!  Cross that item off! o Don't drink your calories. Ie, alcohol, soda, fruit juice, and sweet tea.  o Drink more water. Drink a glass when you feel hungry or before each meal.  o Eat breakfast - Complex carb and protein (likeDannon light and fit yogurt, oatmeal, fruit, eggs, [place] bacon). o Measure your cereal.  Eat no more than one cup a day. (ie [place]) o Eat an apple a day. o Add a vegetable a day. o Try a new vegetable a month. o Use Pam! Stop using oil or butter to cook. o Don't finish your plate or use smaller plates. o Share your dessert. o Eat sugar free Jello for dessert or frozen grapes. o Don't eat 2-3 hours before bed. o Switch to whole wheat bread, pasta, and brown rice. o Make healthier choices when you eat out. No fries! o Pick baked chicken, NOT fried. o Don't forget to SLOW DOWN when you eat. It is not going anywhere.  o Take the stairs. o Park far away in the parking lot o [employer] (or weights) for 10 minutes while watching TV. o Walk at work for 10 minutes during break. o Walk outside 1 time a week with your friend, kids, dog, or significant other. o Start a walking group at [place] the mall as much as you can tolerate.   o Keep a food diary. o Weigh yourself daily. o Walk for 15 minutes 3 days per week. o Cook at home more often and eat out less.  If life happens and you go back to old habits, it is okay.  Just start over. You can do it!   If you experience chest pain, get short of breath, or tired during the exercise, please stop immediately and inform your doctor.   Phentermine  While taking the medication we may ask that you come into the office once a month or once every 2-3 months to monitor your weight, blood pressure, and heart rate. In addition we can help answer your questions about diet, exercise, and help you every step of the way with your weight loss journey. Sometime it is helpful if you bring in a food diary or use an app on your phone such as myfitnesspal to record your calorie intake, especially in the beginning.   You can start out on 1/3 to 1/2 a pill in the morning and if you are tolerating it well you can increase to one pill daily. I also have some patients that take 1/3 or 1/2 at lunch to help prevent night time eating.  This medication is cheapest CASH pay at [place]  [name] is 14-17 dollars and you do NOT need a membership to get meds from there.    What is this medicine? PHENTERMINE (FEN ter meen) decreases your appetite. This medicine is intended to be used in addition to a healthy reduced calorie diet and exercise. The best results are achieved this way. This medicine is only indicated for short-term use. Eventually your weight loss may level out and the medication will no longer be needed.   How should I use this medicine? Take this medicine by mouth. Follow the directions on the prescription label. The tablets should stay in the bottle until immediately before you take your dose. Take your doses at regular intervals. Do not take your medicine more often than directed.  Overdosage: If you think you have taken too much of this medicine contact a poison control  center or emergency room at once. NOTE: This medicine is only for you. Do not share this medicine with others.  What if I miss a dose? If you miss a dose, take it as soon as you can. If it is almost time for your next dose, take only that dose. Do not take double or extra doses. Do not increase or in any way change your dose without consulting your doctor.  What should I watch for while using this medicine? Notify your physician immediately if you become short of breath while doing your normal activities. Do not take this medicine within 6 hours of bedtime. It can keep you from getting to sleep. Avoid drinks that contain caffeine and try to stick to a regular bedtime every night. Do not stand or sit up quickly, especially if you are an older patient. This reduces the risk of dizzy or fainting spells. Avoid alcoholic drinks.  What side effects may I notice from receiving this medicine? Side effects that you should report to your doctor or health care professional as soon as possible: -chest pain, palpitations -depression or severe changes in mood -increased blood pressure -irritability -nervousness or restlessness -severe dizziness -shortness of breath -problems urinating -unusual swelling of the legs -vomiting  Side effects that usually do not require medical attention (report to your doctor or health care professional if they continue or are bothersome): -blurred vision or other eye problems -changes in sexual ability or desire -constipation or diarrhea -difficulty sleeping -dry mouth or unpleasant taste -headache -nausea This list may not describe all possible side effects. Call your doctor for medical advice about side effects. You may report side effects to FDA at 1-800-FDA-1088.    Your ears and sinuses are connected by the eustachian tube. When your sinuses are inflamed, this can close off the tube and cause fluid to collect in your middle ear. This can then cause dizziness,  popping, clicking, ringing, and echoing in your ears. This is often NOT an infection and does NOT require antibiotics, it is caused by inflammation so the treatments help the inflammation. This can take a long time to get better so please be patient.  Here are things you can do to help with this: - Try the Flonase or Nasonex. Remember to spray each nostril twice towards the outer part of your eye.  Do not sniff but instead pinch your nose and tilt your head back to help the medicine get into your sinuses.  The best time to do this is at bedtime.Stop if you get blurred vision or nose bleeds.  -While drinking fluids, pinch and hold nose close and swallow, to help open  eustachian tubes to drain fluid behind ear drums. -Please pick one of the over the counter allergy medications below and take it once daily for allergies.  It will also help with fluid behind ear drums. Claritin or loratadine cheapest but likely the weakest  Zyrtec or certizine at night because it can make you sleepy The strongest is allegra or fexafinadine  Cheapest at [facility], [employer], [employer] -can use decongestant over the counter, please do not use if you have high blood pressure or certain heart conditions.   if worsening HA, changes vision/speech, imbalance, weakness go to the ER  What is the TMJ? The temporomandibular (tem-PUH-ro-man-DIB-yoo-ler) joint, or the TMJ, connects the upper and lower jawbones. This joint allows the jaw to open wide and move back and forth when you chew, talk, or yawn.There are also several muscles that help this joint move. There can be muscle tightness and pain in the muscle that can cause several symptoms.  What causes TMJ pain? There are many causes of TMJ pain. Repeated chewing (for example, chewing gum) and clenching your teeth can cause pain in the joint. Some TMJ pain has no obvious cause. What can I do to ease the pain? There are many things you can do to help your pain get better. When you have  pain:  Eat soft foods and stay away from chewy foods (for example, taffy) Try to use both sides of your mouth to chew Don't chew gum Massage Don't open your mouth wide (for example, during yawning or singing) Don't bite your cheeks or fingernails Lower your amount of stress and worry Applying a warm, damp washcloth to the joint may help. Over-the-counter pain medicines such as ibuprofen (one brand: Advil) or acetaminophen (one brand: Tylenol) might also help. Do not use these medicines if you are allergic to them or if your doctor told you not to use them. How can I stop the pain from coming back? When your pain is better, you can do these exercises to make your muscles stronger and to keep the pain from coming back:  Resisted mouth opening: Place your thumb or two fingers under your chin and open your mouth slowly, pushing up lightly on your chin with your thumb. Hold for three to six seconds. Close your mouth slowly. Resisted mouth closing: Place your thumbs under your chin and your two index fingers on the ridge between your mouth and the bottom of your chin. Push down lightly on your chin as you close your mouth. Tongue up: Slowly open and close your mouth while keeping the tongue touching the roof of the mouth. Side-to-side jaw movement: Place an object about one fourth of an inch thick (for example, two tongue depressors) between your front teeth. Slowly move your jaw from side to side. Increase the thickness of the object as the exercise becomes easier Forward jaw movement: Place an object about one fourth of an inch thick between your front teeth and move the bottom jaw forward so that the bottom teeth are in front of the top teeth. Increase the thickness of the object as the exercise becomes easier. These exercises should not be painful. If it hurts to do these exercises, stop doing them and talk to your family doctor.

## 2017-02-23 LAB — CBC WITH DIFFERENTIAL/PLATELET
BASOS ABS: 29 {cells}/uL (ref 0–200)
Basophils Relative: 0.6 %
EOS ABS: 69 {cells}/uL (ref 15–500)
Eosinophils Relative: 1.4 %
HEMATOCRIT: 41.9 % (ref 35.0–45.0)
HEMOGLOBIN: 14.1 g/dL (ref 11.7–15.5)
LYMPHS ABS: 2381 {cells}/uL (ref 850–3900)
MCH: 28.6 pg (ref 27.0–33.0)
MCHC: 33.7 g/dL (ref 32.0–36.0)
MCV: 85 fL (ref 80.0–100.0)
MPV: 10.9 fL (ref 7.5–12.5)
Monocytes Relative: 8.8 %
NEUTROS ABS: 1989 {cells}/uL (ref 1500–7800)
NEUTROS PCT: 40.6 %
Platelets: 299 10*3/uL (ref 140–400)
RBC: 4.93 10*6/uL (ref 3.80–5.10)
RDW: 13.2 % (ref 11.0–15.0)
Total Lymphocyte: 48.6 %
WBC: 4.9 10*3/uL (ref 3.8–10.8)
WBCMIX: 431 {cells}/uL (ref 200–950)

## 2017-02-23 LAB — TSH: TSH: 2.42 mIU/L

## 2017-02-23 LAB — HEPATIC FUNCTION PANEL
AG RATIO: 1.6 (calc) (ref 1.0–2.5)
ALBUMIN MSPROF: 4.1 g/dL (ref 3.6–5.1)
ALKALINE PHOSPHATASE (APISO): 43 U/L (ref 33–130)
ALT: 21 U/L (ref 6–29)
AST: 24 U/L (ref 10–35)
BILIRUBIN DIRECT: 0.1 mg/dL (ref 0.0–0.2)
BILIRUBIN TOTAL: 0.5 mg/dL (ref 0.2–1.2)
Globulin: 2.5 g/dL (calc) (ref 1.9–3.7)
Indirect Bilirubin: 0.4 mg/dL (calc) (ref 0.2–1.2)
Total Protein: 6.6 g/dL (ref 6.1–8.1)

## 2017-02-23 LAB — VITAMIN D 25 HYDROXY (VIT D DEFICIENCY, FRACTURES): VIT D 25 HYDROXY: 31 ng/mL (ref 30–100)

## 2017-02-23 LAB — BASIC METABOLIC PANEL WITH GFR
BUN: 9 mg/dL (ref 7–25)
CO2: 28 mmol/L (ref 20–32)
CREATININE: 0.75 mg/dL (ref 0.50–1.05)
Calcium: 9.1 mg/dL (ref 8.6–10.4)
Chloride: 105 mmol/L (ref 98–110)
GFR, EST AFRICAN AMERICAN: 105 mL/min/{1.73_m2} (ref >=60)
GFR, EST NON AFRICAN AMERICAN: 90 mL/min/{1.73_m2} (ref >=60)
Glucose, Bld: 97 mg/dL (ref 65–99)
POTASSIUM: 4.4 mmol/L (ref 3.5–5.3)
SODIUM: 142 mmol/L (ref 135–146)

## 2017-02-23 LAB — LIPID PANEL
CHOL/HDL RATIO: 3.1 (calc) (ref <5.0)
Cholesterol: 186 mg/dL (ref <200)
HDL: 60 mg/dL (ref >50)
LDL Cholesterol (Calc): 105 mg/dL (calc) — ABNORMAL HIGH
NON-HDL CHOLESTEROL (CALC): 126 mg/dL (ref <130)
TRIGLYCERIDES: 111 mg/dL (ref <150)

## 2017-02-23 LAB — HEMOGLOBIN A1C
HEMOGLOBIN A1C: 6.1 %{Hb} — AB (ref <5.7)
MEAN PLASMA GLUCOSE: 128 (calc)
eAG (mmol/L): 7.1 (calc)

## 2017-02-23 LAB — MAGNESIUM: MAGNESIUM: 1.8 mg/dL (ref 1.5–2.5)

## 2017-03-26 ENCOUNTER — Other Ambulatory Visit: Payer: Self-pay

## 2017-03-26 MED ORDER — ERGOCALCIFEROL 1.25 MG (50000 UT) PO CAPS: 50000.0000 [IU] | ORAL_CAPSULE | ORAL | 3 refills | Status: DC

## 2017-03-28 NOTE — Progress Notes (Deleted)
57 y.o.female presents for a follow up after being on phentermine for weight loss.  While on the medication they have lost {NUMBERS 0-12:18577} lbs since last visit. They deny palpitations, anxiety, trouble sleeping, elevated BP.   BP Readings from Last 3 Encounters:  02/22/17 136/80  09/25/16 128/76  08/02/16 136/74   An EKG was obtained at today's visit.   BMI is There is no height or weight on file to calculate BMI., she is working on diet and exercise. Wt Readings from Last 3 Encounters:  02/22/17 (!) 328 lb (148.8 kg)  09/25/16 (!) 322 lb 9.6 oz (146.3 kg)  08/02/16 (!) 325 lb (147.4 kg)    Typical breakfast: Typical lunch:  Typical dinner:  Medications: Current Outpatient Medications on File Prior to Visit  Medication Sig Dispense Refill  . cetirizine (ZYRTEC) 10 MG tablet Take 10 mg by mouth daily as needed for allergies.     . cyclobenzaprine (FLEXERIL) 10 MG tablet TAKE 1 PILL NIGHTLY AS NEEDED FOR TMJ/NECK PAIN 90 tablet 1  . ergocalciferol (VITAMIN D2) 50000 units capsule Take 1 capsule (50,000 Units total) by mouth 3 (three) times a week. 180 capsule 3  . fluticasone (FLONASE) 50 MCG/ACT nasal spray Place 2 sprays into both nostrils at bedtime. 16 g 1  . phentermine (ADIPEX-P) 37.5 MG tablet Take 1 tablet (37.5 mg total) by mouth daily before breakfast. 30 tablet 2  . ranitidine (ZANTAC) 75 MG tablet Take 1 tablet (75 mg total) by mouth once as needed for heartburn. 90 tablet 1  . simvastatin (ZOCOR) 10 MG tablet Take 1 tablet (10 mg total) by mouth at bedtime. 90 tablet 2  . valsartan-hydrochlorothiazide (DIOVAN-HCT) 320-25 MG tablet Take 1 tablet by mouth every morning. 90 tablet 2   No current facility-administered medications on file prior to visit.     ROS: All negative except for above  Physical exam: There were no vitals filed for this visit. Physical Exam  Assessment: Obesity with co morbid conditions.   Plan: General weight loss/lifestyle modification  strategies discussed (elicit support from others; identify saboteurs; non-food rewards, etc). Continue food diary Continue restricted calorie diet Continue daily exercise as well as behavior modification such as walking further away, putting down the fork, having a plan, using stairs, etc.  Medication: phentermine  Follow up in {NUMBERS;1-7 BY 1:408017} months Future Appointments  Date Time Provider [place]  03/29/2017 10:00 AM Vicie Mutters, PA-C [facility] None    [id number] 15 mins [id number] 30 mins [id number] 45 mins 99404 60 mins

## 2017-03-29 ENCOUNTER — Ambulatory Visit: Payer: No Typology Code available for payment source | Admitting: Physician Assistant

## 2017-04-11 ENCOUNTER — Telehealth: Payer: Self-pay | Admitting: Physician Assistant

## 2017-04-11 MED ORDER — OLMESARTAN MEDOXOMIL-HCTZ 40-25 MG PO TABS: 1.0000 | ORAL_TABLET | Freq: Every day | ORAL | 0 refills | Status: DC

## 2017-04-11 NOTE — Telephone Encounter (Signed)
Has one of the recalled Diovan. Please stop and start on Benicar 40/25mg , monitor BP, this can be stronger than the valsartan so may have to cut in half.

## 2017-04-11 NOTE — Telephone Encounter (Signed)
Pt has been informed of the recall & voiced understanding to stop VALSARTAN & start [place]. Jan 31st 2019 at 11am by DD

## 2017-05-21 LAB — HM MAMMOGRAPHY

## 2017-05-23 LAB — HM PAP SMEAR: HM Pap smear: NEGATIVE

## 2017-11-05 NOTE — Progress Notes (Signed)
Complete Physical  Assessment and Plan:  [name] was seen today for annual exam.  Diagnoses and all orders for this visit:  Encounter for routine adult health examination with abnormal findings PAP/mammogram reports requested  Essential hypertension Continue benicar; adding amlodipine 5 mg daily - patient req to postpone starting - she will return in 1 week for BP recheck, start if 140/90+ Monitor blood pressure at home; call if consistently over 130/80 Continue DASH diet.   Reminder to go to the ER if any CP, SOB, nausea, dizziness, severe HA, changes vision/speech, left arm numbness and tingling and jaw pain. -     COMPLETE METABOLIC PANEL WITH GFR -     Magnesium -     Microalbumin / creatinine urine ratio -     EKG 12-Lead  History of colon polyps UTD on colonoscopy  Prediabetes Discussed disease and risks Discussed diet/exercise, weight management  -     Hemoglobin A1c -     Microalbumin / creatinine urine ratio  Morbid obesity ([place]) Long discussion about weight loss, diet, and exercise Discussed final goal weight  Patient to restart on phentermine; continue close follow up.  Discussed getting an accountability partner, setting regular goals and sharing with partner, increase fluids, diet/exercise goals discussed and info provided Return in 3 months  Medication management -     CBC with Differential/Platelet -     COMPLETE METABOLIC PANEL WITH GFR -     Magnesium -     Urinalysis w microscopic + reflex cultur  Mixed hyperlipidemia Continue medications Continue low cholesterol diet and exercise.  Check lipid panel.  -     Lipid panel -     TSH  Environmental allergies Continue OTC allergy pills, flonase  Vitamin D deficiency -     VITAMIN D 25 Hydroxy (Vit-D Deficiency, Fractures)   Discussed med's effects and SE's. Screening labs and tests as requested with regular follow-up as recommended. Over 40 minutes of exam, counseling, chart review, and complex,  high level critical decision making was performed this visit.   Future Appointments  Date Time Provider [place]  11/11/2018  9:00 AM Liane Comber, NP [facility] None    HPI  57 y.o. female  presents for a complete physical and follow up for has Prediabetes; Hypertension; Hyperlipidemia; Environmental allergies; Morbid obesity ([place]); History of colon polyps; and Medication management on their problem list.   She is followed by GYN annually.   she is prescribed phentermine for weight loss but has not started taking. Has take previously with SE with successful 30 lb weight loss.    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   Her blood pressure has been controlled at home (130/80), today their BP is BP: (!) 144/88 Recheck 162/86 manually.  She does workout. She denies chest pain, shortness of breath, dizziness.   She is on cholesterol medication (simvastatin 10 mg daily) and denies myalgias. Her cholesterol is not at goal. The cholesterol last visit was:   Lab Results  Component Value Date   CHOL 186  02/22/2017   HDL 60 02/22/2017   LDLCALC 105 (H) 02/22/2017   TRIG 111 02/22/2017   CHOLHDL 3.1 02/22/2017   She has not been working on diet and exercise for prediabetes, she is not on bASA, she is on ACE/ARB and denies foot ulcerations, increased appetite, nausea, paresthesia of the feet, polydipsia, polyuria, visual disturbances and vomiting. Last A1C in the office was:  Lab Results  Component Value Date   HGBA1C 6.1 (H) 02/22/2017   Last GFR: Lab Results  Component Value Date    GFRAA 105 02/22/2017   Patient is on Vitamin D supplement.   Lab Results  Component Value Date   VD25OH 31 02/22/2017      Current Medications:  Current Outpatient Medications on File Prior to Visit  Medication Sig Dispense Refill  . cetirizine (ZYRTEC) 10 MG tablet Take 10 mg by mouth daily as needed for allergies.     . cyclobenzaprine (FLEXERIL) 10 MG tablet TAKE 1 PILL NIGHTLY AS NEEDED FOR TMJ/NECK PAIN 90 tablet 1  . ergocalciferol (VITAMIN D2) 50000 units capsule Take 1 capsule (50,000 Units total) by mouth 3 (three) times a week. 180 capsule 3  . fluticasone (FLONASE) 50 MCG/ACT nasal spray Place 2 sprays into both nostrils at bedtime. 16 g 1  . olmesartan-hydrochlorothiazide (BENICAR HCT) 40-25 MG tablet Take 1 tablet by mouth daily. 90 tablet 0  . ranitidine (ZANTAC) 75 MG tablet Take 1 tablet (75 mg total) by mouth once as needed for heartburn. 90 tablet 1  . simvastatin (ZOCOR) 10 MG tablet Take 1 tablet (10 mg total) by mouth at bedtime. 90 tablet 2  . phentermine (ADIPEX-P) 37.5 MG tablet Take 1 tablet (37.5 mg total) by mouth daily before breakfast. (Patient not taking: Reported on 11/06/2017) 30 tablet 2   No current facility-administered medications on file prior to visit.    Allergies:  No Known Allergies Medical History:  She has Prediabetes; Hypertension; Hyperlipidemia; Environmental allergies; Morbid obesity ([place]); History of colon polyps; and Medication management on their problem list. Health Maintenance:   Immunization History  Administered Date(s) Administered  . Influenza,inj,quad, With Preservative 12/26/2015  . Influenza-Unspecified 01/28/2014  . PPD Test 08/19/2013  . Td 09/06/2003  . Tdap 08/19/2013    Tetanus: 2015 Pneumovax: Prevnar 13: - Flu vaccine: 2017 Zostavax:  LMP: postmenopausal Pap: 04/2017 with GYN MGM: 04/2017 with GYN DEXA:  Colonoscopy: 2015 due 2025 EGD: -  Last Dental Exam: Dr. Gerlene Burdock, 2019, goes q67m Last Eye Exam:  2018  Patient Care Team: Unk Pinto, MD as PCP - General (Internal Medicine) Molli Posey, MD as Consulting Physician (Obstetrics and Gynecology)  Surgical History:  She has a past surgical history that includes Cesarean section; Umbilical hernia repair; Tubal ligation; and Colonoscopy with propofol (N/A, 12/15/2013). Family History:  Herfamily history includes Alcohol abuse in her mother; Cancer in her father; Cancer (age of onset: 88) in her sister; Diabetes in her paternal grandmother and son; Hypertension in her mother. Social History:  She reports that she has never smoked. She has never used smokeless tobacco. She reports that she does not drink alcohol or use drugs.  Review of Systems: Review of Systems  Constitutional: Negative for malaise/fatigue and weight loss.  HENT: Negative for hearing loss and tinnitus.   Eyes: Negative for blurred vision and double vision.  Respiratory: Negative for cough, shortness of breath and wheezing.   Cardiovascular: Negative for chest pain, palpitations, orthopnea, claudication and leg swelling.  Gastrointestinal: Negative for abdominal pain, blood  in stool, constipation, diarrhea, heartburn, melena, nausea and vomiting.  Genitourinary: Negative.   Musculoskeletal: Negative for joint pain and myalgias.  Skin: Negative for rash.  Neurological: Negative for dizziness, tingling, sensory change, weakness and headaches.  Endo/Heme/Allergies: Negative for polydipsia.  Psychiatric/Behavioral: Negative.   All other systems reviewed and are negative.   Physical Exam: Estimated body mass index is 55.06 kg/m as calculated from the following:   Height as of this encounter: 5' 5.5" (1.664 m).   Weight as of this encounter: 336 lb (152.4 kg). BP (!) 144/88   Pulse 81   Temp (!) 97.5 F (36.4 C)   Ht 5' 5.5" (1.664 m)   Wt (!) 336 lb (152.4 kg)   SpO2 95%   BMI 55.06 kg/m  General Appearance: Morbidly obese, in no apparent distress.   Eyes: PERRLA, EOMs, conjunctiva no swelling or erythema, normal fundi and vessels.  Sinuses: No Frontal/maxillary tenderness  ENT/Mouth: Ext aud canals clear, normal light reflex with TMs without erythema, bulging. Good dentition. No erythema, swelling, or exudate on post pharynx. Tonsils not swollen or erythematous. Hearing normal.  Neck: Supple, thyroid normal. No bruits  Respiratory: Respiratory effort normal, BS equal bilaterally without rales, rhonchi, wheezing or stridor.  Cardio: RRR without murmurs, rubs or gallops. Brisk peripheral pulses without edema.  Chest: symmetric, with normal excursions and percussion.  Breasts: Defer to GYN Abdomen: Soft, morbidly obese abdomen limiting exam, nontender, no guarding, rebound, palpable hernias, masses, or organomegaly.  Lymphatics: Non tender without lymphadenopathy.  Genitourinary: Defer to GYN  Musculoskeletal: Full ROM all peripheral extremities,5/5 strength, and normal gait.  Skin: Warm, dry without rashes, lesions, ecchymosis. Neuro: Cranial nerves intact, reflexes equal bilaterally. Normal muscle tone, no cerebellar symptoms. Sensation intact.  Psych: Awake and oriented X 3, normal affect, Insight and Judgment appropriate.   EKG: WNL no ST changes.  Izora Ribas 9:13 AM [facility]

## 2017-11-06 ENCOUNTER — Ambulatory Visit (INDEPENDENT_AMBULATORY_CARE_PROVIDER_SITE_OTHER): Payer: 59 | Admitting: Adult Health

## 2017-11-06 ENCOUNTER — Encounter: Payer: Self-pay | Admitting: Adult Health

## 2017-11-06 VITALS — BP 144/88 | HR 81 | Temp 97.5°F | Ht 65.5 in | Wt 336.0 lb

## 2017-11-06 DIAGNOSIS — I10 Essential (primary) hypertension: Secondary | ICD-10-CM

## 2017-11-06 DIAGNOSIS — E782 Mixed hyperlipidemia: Secondary | ICD-10-CM

## 2017-11-06 DIAGNOSIS — Z136 Encounter for screening for cardiovascular disorders: Secondary | ICD-10-CM

## 2017-11-06 DIAGNOSIS — E559 Vitamin D deficiency, unspecified: Secondary | ICD-10-CM

## 2017-11-06 DIAGNOSIS — Z0000 Encounter for general adult medical examination without abnormal findings: Secondary | ICD-10-CM

## 2017-11-06 DIAGNOSIS — Z0001 Encounter for general adult medical examination with abnormal findings: Secondary | ICD-10-CM

## 2017-11-06 DIAGNOSIS — Z860100 Personal history of colon polyps, unspecified: Secondary | ICD-10-CM

## 2017-11-06 DIAGNOSIS — Z79899 Other long term (current) drug therapy: Secondary | ICD-10-CM

## 2017-11-06 DIAGNOSIS — Z9109 Other allergy status, other than to drugs and biological substances: Secondary | ICD-10-CM

## 2017-11-06 DIAGNOSIS — R7303 Prediabetes: Secondary | ICD-10-CM

## 2017-11-06 MED ORDER — AMLODIPINE BESYLATE 5 MG PO TABS: 5.0000 mg | ORAL_TABLET | Freq: Every day | ORAL | 1 refills | Status: DC

## 2017-11-06 NOTE — Patient Instructions (Addendum)
[name] , Thank you for taking time to come for your Annual Physical. I appreciate your ongoing commitment to your health goals. Please review the following plan we discussed and let me know if I can assist you in the future.   These are the goals we discussed: Goals    . Blood Pressure < 130/80    . DIET - INCREASE WATER INTAKE     Aim for 65-80+ fluid ounces daily     . Exercise 150 min/wk Moderate Activity    . Weight (lb) < 320 lb (145.2 kg)       This is a list of the screening recommended for you and due dates:  Health Maintenance  Topic Date Due  . Mammogram  03/12/2017  . Pap Smear  04/22/2017  . Flu Shot  10/10/2017  .  Hepatitis C: One time screening is recommended by Center for Disease Control  (CDC) for  adults born from [birth year] through 1963.   11/07/2018*  . HIV Screening  11/07/2018*  . Tetanus Vaccine  08/20/2023  . Colon Cancer Screening  12/16/2023  *Topic was postponed. The date shown is not the original due date.    Know what a healthy weight is for you (roughly BMI <25) and aim to maintain this  Aim for 5-10 servings of fruits and vegetables daily  65-80+ fluid ounces of water or unsweet tea for healthy kidneys  Limit to max 1 drink of alcohol per day; avoid smoking/tobacco  Limit animal fats in diet for cholesterol and heart health - choose grass fed whenever available  Avoid highly processed foods, and foods high in saturated/trans fats  Aim for low stress - take time to unwind and care for your mental health  Aim for 150 min of moderate intensity exercise weekly for heart health, and weights twice weekly for bone health  Aim for 7-9 hours of sleep daily    Drink 1/2 your body weight in fluid ounces of water daily; drink a tall glass of water 30 min before meals  Don't eat until you're stuffed- listen to your stomach and eat until you are 80% full   Try eating off of a salad plate; wait 10 min after finishing before going back for  seconds  Start by eating the vegetables on your plate; aim for 50% of your meals to be fruits or vegetables  Then eat your protein - lean meats (grass fed if possible), fish, beans, nuts in moderation  Eat your carbs/starch last ONLY if you still are hungry. If you can, stop before finishing it all  Avoid sugar and flour - the closer it looks to it's original form in nature, typically the better it is for you  Splurge in moderation - "assign" days when you get to splurge and have the "bad stuff" - I like to follow a 80% - 20% plan- "good" choices 80 % of the time, "bad" choices in moderation 20% of the time  Simple equation is: Calories out > calories in = weight loss - even if you eat the bad stuff, if you limit portions, you will still lose weight        When it comes to diets, agreement about the perfect plan isn't easy to find, even among the experts. Experts at the [employer] developed an idea known as the Healthy Eating Plate. Just imagine a plate divided into logical, healthy portions.  The emphasis is on diet quality:  Load up on  vegetables and fruits - one-half of your plate: Aim for color and variety, and remember that potatoes don't count.  Go for whole grains - one-quarter of your plate: Whole wheat, barley, wheat berries, quinoa, oats, brown rice, and foods made with them. If you want pasta, go with whole wheat pasta.  Protein power - one-quarter of your plate: Fish, chicken, beans, and nuts are all healthy, versatile protein sources. Limit red meat.  The diet, however, does go beyond the plate, offering a few other suggestions.  Use healthy plant oils, such as olive, canola, soy, corn, sunflower and peanut. Check the labels, and avoid partially hydrogenated oil, which have unhealthy trans fats.  If you're thirsty, drink water. Coffee and tea are good in moderation, but skip sugary drinks and limit milk and dairy products to one or two daily  servings.  The type of carbohydrate in the diet is more important than the amount. Some sources of carbohydrates, such as vegetables, fruits, whole grains, and beans-are healthier than others.  Finally, stay active.

## 2017-11-07 LAB — URINALYSIS W MICROSCOPIC + REFLEX CULTURE
Bilirubin Urine: NEGATIVE
GLUCOSE, UA: NEGATIVE
HGB URINE DIPSTICK: NEGATIVE
HYALINE CAST: NONE SEEN /LPF
Ketones, ur: NEGATIVE
Leukocyte Esterase: NEGATIVE
Nitrites, Initial: NEGATIVE
PROTEIN: NEGATIVE
Specific Gravity, Urine: 1.014 (ref 1.001–1.03)
pH: 6 (ref 5.0–8.0)

## 2017-11-07 LAB — CBC WITH DIFFERENTIAL/PLATELET
BASOS PCT: 0.6 %
Basophils Absolute: 32 cells/uL (ref 0–200)
EOS ABS: 92 {cells}/uL (ref 15–500)
Eosinophils Relative: 1.7 %
HCT: 39.2 % (ref 35.0–45.0)
Hemoglobin: 12.9 g/dL (ref 11.7–15.5)
Lymphs Abs: 2354 cells/uL (ref 850–3900)
MCH: 28.4 pg (ref 27.0–33.0)
MCHC: 32.9 g/dL (ref 32.0–36.0)
MCV: 86.3 fL (ref 80.0–100.0)
MONOS PCT: 9.9 %
MPV: 10.8 fL (ref 7.5–12.5)
Neutro Abs: 2387 cells/uL (ref 1500–7800)
Neutrophils Relative %: 44.2 %
PLATELETS: 353 10*3/uL (ref 140–400)
RBC: 4.54 10*6/uL (ref 3.80–5.10)
RDW: 13.3 % (ref 11.0–15.0)
TOTAL LYMPHOCYTE: 43.6 %
WBC mixed population: 535 cells/uL (ref 200–950)
WBC: 5.4 10*3/uL (ref 3.8–10.8)

## 2017-11-07 LAB — LIPID PANEL
CHOLESTEROL: 206 mg/dL — AB (ref <200)
HDL: 54 mg/dL (ref >50)
LDL Cholesterol (Calc): 130 mg/dL (calc) — ABNORMAL HIGH
Non-HDL Cholesterol (Calc): 152 mg/dL (calc) — ABNORMAL HIGH (ref <130)
TRIGLYCERIDES: 112 mg/dL (ref <150)
Total CHOL/HDL Ratio: 3.8 (calc) (ref <5.0)

## 2017-11-07 LAB — VITAMIN D 25 HYDROXY (VIT D DEFICIENCY, FRACTURES): VIT D 25 HYDROXY: 28 ng/mL — AB (ref 30–100)

## 2017-11-07 LAB — COMPLETE METABOLIC PANEL WITH GFR
AG Ratio: 1.6 (calc) (ref 1.0–2.5)
ALKALINE PHOSPHATASE (APISO): 43 U/L (ref 33–130)
ALT: 29 U/L (ref 6–29)
AST: 30 U/L (ref 10–35)
Albumin: 4.1 g/dL (ref 3.6–5.1)
BILIRUBIN TOTAL: 0.4 mg/dL (ref 0.2–1.2)
BUN: 9 mg/dL (ref 7–25)
CHLORIDE: 103 mmol/L (ref 98–110)
CO2: 31 mmol/L (ref 20–32)
Calcium: 9.5 mg/dL (ref 8.6–10.4)
Creat: 0.97 mg/dL (ref 0.50–1.05)
GFR, Est African American: 77 mL/min/{1.73_m2} (ref >=60)
GFR, Est Non African American: 66 mL/min/{1.73_m2} (ref >=60)
GLUCOSE: 107 mg/dL — AB (ref 65–99)
Globulin: 2.5 g/dL (calc) (ref 1.9–3.7)
Potassium: 3.9 mmol/L (ref 3.5–5.3)
Sodium: 143 mmol/L (ref 135–146)
TOTAL PROTEIN: 6.6 g/dL (ref 6.1–8.1)

## 2017-11-07 LAB — TSH: TSH: 1.97 mIU/L

## 2017-11-07 LAB — HEMOGLOBIN A1C
HEMOGLOBIN A1C: 6.7 %{Hb} — AB (ref <5.7)
MEAN PLASMA GLUCOSE: 146 (calc)
eAG (mmol/L): 8.1 (calc)

## 2017-11-07 LAB — MICROALBUMIN / CREATININE URINE RATIO
Creatinine, Urine: 106 mg/dL (ref 20–275)
Microalb Creat Ratio: 3 mcg/mg creat (ref <30)
Microalb, Ur: 0.3 mg/dL

## 2017-11-07 LAB — MAGNESIUM: Magnesium: 1.9 mg/dL (ref 1.5–2.5)

## 2017-11-07 LAB — NO CULTURE INDICATED

## 2017-11-08 ENCOUNTER — Other Ambulatory Visit: Payer: Self-pay

## 2017-11-08 MED ORDER — OLMESARTAN MEDOXOMIL-HCTZ 40-25 MG PO TABS: 1.0000 | ORAL_TABLET | Freq: Every day | ORAL | 0 refills | Status: DC

## 2017-11-14 ENCOUNTER — Ambulatory Visit (INDEPENDENT_AMBULATORY_CARE_PROVIDER_SITE_OTHER): Payer: 59 | Admitting: *Deleted

## 2017-11-14 VITALS — BP 134/82 | HR 76 | Temp 97.8°F | Resp 18 | Wt 330.0 lb

## 2017-11-14 DIAGNOSIS — I10 Essential (primary) hypertension: Secondary | ICD-10-CM | POA: Diagnosis not present

## 2017-11-14 NOTE — Progress Notes (Signed)
Patient is here for a NV to recheck her BP.  The patient has continued to take Benicar 40/25 mg daily and did not purchase the Amlodipine..  She states she has started walking and has increased her water intake.  Her BP improved and is 134/82 today.  Per Liane Comber, NP, the patient should continue her present medication and the Amlodipine can be discontinued. The patient has a 3 month visit on 02/03/2018.

## 2017-11-27 ENCOUNTER — Encounter: Payer: Self-pay | Admitting: Adult Health

## 2018-01-28 ENCOUNTER — Other Ambulatory Visit: Payer: Self-pay | Admitting: Physician Assistant

## 2018-01-28 DIAGNOSIS — E782 Mixed hyperlipidemia: Secondary | ICD-10-CM

## 2018-01-30 DIAGNOSIS — N182 Chronic kidney disease, stage 2 (mild): Secondary | ICD-10-CM | POA: Insufficient documentation

## 2018-01-30 DIAGNOSIS — E1122 Type 2 diabetes mellitus with diabetic chronic kidney disease: Secondary | ICD-10-CM | POA: Insufficient documentation

## 2018-01-30 DIAGNOSIS — E559 Vitamin D deficiency, unspecified: Secondary | ICD-10-CM | POA: Insufficient documentation

## 2018-01-30 NOTE — Progress Notes (Deleted)
FOLLOW UP  Assessment and Plan:   Hypertension Well controlled with current medications  Monitor blood pressure at home; patient to call if consistently greater than 130/80 Continue DASH diet.   Reminder to go to the ER if any CP, SOB, nausea, dizziness, severe HA, changes vision/speech, left arm numbness and tingling and jaw pain.  Cholesterol Currently above goal; simvastatin 10 mg *** Continue low cholesterol diet and exercise.  Check lipid panel.   Prediabetes/T2DM with CKD 2 *** Continue diet and exercise.  Perform daily foot/skin check, notify office of any concerning changes.  Check A1C  Morbid Obesity with co morbidities Long discussion about weight loss, diet, and exercise Recommended diet heavy in fruits and veggies and low in animal meats, cheeses, and dairy products, appropriate calorie intake Discussed ideal weight for height and initial weight goal (***) Patient will work on *** Patient on phentermine with benefit and no SE, taking drug breaks; continue close follow up. Return in ***  Will follow up in 3 months  Vitamin D Def Below goal at last visit; she *** changed dose continue supplementation to maintain goal of 70-100 *** Vit D level  Continue diet and meds as discussed. Further disposition pending results of labs. Discussed med's effects and SE's.   Over 30 minutes of exam, counseling, chart review, and critical decision making was performed.   Future Appointments  Date Time Provider [place]  02/03/2018  9:30 AM Liane Comber, NP [facility] None  05/12/2018  8:45 AM Liane Comber, NP [facility] None  11/11/2018  9:00 AM Liane Comber, NP [facility] None    ----------------------------------------------------------------------------------------------------------------------  HPI 57 y.o. female  presents for 3 month follow up on hypertension, cholesterol, diabetes, weight and vitamin D deficiency.    she is prescribed phentermine for  weight loss.  While on the medication they have lost {NUMBERS 0-12:18577} lbs since last visit. They deny palpitations, anxiety, trouble sleeping, elevated BP.   BMI is There is no height or weight on file to calculate BMI., she is working on diet and exercise. Wt Readings from Last 3 Encounters:  11/14/17 (!) 330 lb (149.7 kg)  11/06/17 (!) 336 lb (152.4 kg)  02/22/17 (!) 328 lb (148.8 kg)   Typical breakfast: Typical lunch:  Typical dinner: Exercise:  Water intake:    Her blood pressure {HAS HAS NOT:18834} been controlled at home, today their BP is    She {DOES_DOES EZM:62947} workout. She denies chest pain, shortness of breath, dizziness.   She is on cholesterol medication Simvastatin 10 mg daily *** and denies myalgias. Her cholesterol is not at goal. The cholesterol last visit was:   Lab Results  Component Value Date   CHOL 206 (H) 11/06/2017   HDL 54 11/06/2017   LDLCALC 130 (H) 11/06/2017   TRIG 112 11/06/2017   CHOLHDL 3.8 11/06/2017    She {Has/has not:18111} been working on diet and exercise for prediabetes/T2DM, and denies {Symptoms; diabetes w/o none:19199}. Last A1C in the office was:  Lab Results  Component Value Date   HGBA1C 6.7 (H) 11/06/2017   Patient is *** on Vitamin D supplement.   Lab Results  Component Value Date   VD25OH 28 (L) 11/06/2017        Current Medications:  Current Outpatient Medications on File Prior to Visit  Medication Sig  . cetirizine (ZYRTEC) 10 MG tablet Take 10 mg by mouth daily as needed for allergies.   . cyclobenzaprine (FLEXERIL) 10 MG tablet TAKE 1 PILL NIGHTLY AS NEEDED FOR  TMJ/NECK PAIN  . ergocalciferol (VITAMIN D2) 50000 units capsule Take 1 capsule (50,000 Units total) by mouth 3 (three) times a week.  . fluticasone (FLONASE) 50 MCG/ACT nasal spray Place 2 sprays into both nostrils at bedtime.  Marland Kitchen olmesartan-hydrochlorothiazide (BENICAR HCT) 40-25 MG tablet Take 1 tablet by mouth daily.  . phentermine (ADIPEX-P) 37.5  MG tablet Take 1 tablet (37.5 mg total) by mouth daily before breakfast. (Patient not taking: Reported on 11/06/2017)  . ranitidine (ZANTAC) 75 MG tablet Take 1 tablet (75 mg total) by mouth once as needed for heartburn.  . simvastatin (ZOCOR) 10 MG tablet TAKE 1 TABLET BY MOUTH EVERYDAY AT BEDTIME   No current facility-administered medications on file prior to visit.      Allergies: No Known Allergies   Medical History:  Past Medical History:  Diagnosis Date  . Allergy   . Hyperlipidemia   . Hypertension   . Obesity   . Type II or unspecified type diabetes mellitus without mention of complication, not stated as uncontrolled    Family history- Reviewed and unchanged Social history- Reviewed and unchanged   Review of Systems:  Review of Systems  Constitutional: Negative for malaise/fatigue and weight loss.  HENT: Negative for hearing loss and tinnitus.   Eyes: Negative for blurred vision and double vision.  Respiratory: Negative for cough, shortness of breath and wheezing.   Cardiovascular: Negative for chest pain, palpitations, orthopnea, claudication and leg swelling.  Gastrointestinal: Negative for abdominal pain, blood in stool, constipation, diarrhea, heartburn, melena, nausea and vomiting.  Genitourinary: Negative.   Musculoskeletal: Negative for joint pain and myalgias.  Skin: Negative for rash.  Neurological: Negative for dizziness, tingling, sensory change, weakness and headaches.  Endo/Heme/Allergies: Negative for polydipsia.  Psychiatric/Behavioral: Negative.   All other systems reviewed and are negative.     Physical Exam: There were no vitals taken for this visit. Wt Readings from Last 3 Encounters:  11/14/17 (!) 330 lb (149.7 kg)  11/06/17 (!) 336 lb (152.4 kg)  02/22/17 (!) 328 lb (148.8 kg)   General Appearance: Well nourished, in no apparent distress. Eyes: PERRLA, EOMs, conjunctiva no swelling or erythema Sinuses: No Frontal/maxillary  tenderness ENT/Mouth: Ext aud canals clear, TMs without erythema, bulging. No erythema, swelling, or exudate on post pharynx.  Tonsils not swollen or erythematous. Hearing normal.  Neck: Supple, thyroid normal.  Respiratory: Respiratory effort normal, BS equal bilaterally without rales, rhonchi, wheezing or stridor.  Cardio: RRR with no MRGs. Brisk peripheral pulses without edema.  Abdomen: Soft, + BS.  Non tender, no guarding, rebound, hernias, masses. Lymphatics: Non tender without lymphadenopathy.  Musculoskeletal: Full ROM, 5/5 strength, {PSY - GAIT AND STATION:22860} gait Skin: Warm, dry without rashes, lesions, ecchymosis.  Neuro: Cranial nerves intact. No cerebellar symptoms.  Psych: Awake and oriented X 3, normal affect, Insight and Judgment appropriate.    Catherine Ribas, NP 2:44 PM [facility]

## 2018-02-03 ENCOUNTER — Ambulatory Visit: Payer: Self-pay | Admitting: Adult Health

## 2018-02-12 ENCOUNTER — Encounter: Payer: Self-pay | Admitting: Internal Medicine

## 2018-02-17 NOTE — Progress Notes (Signed)
FOLLOW UP  Assessment and Plan:   Hypertension Well controlled with current medications  Monitor blood pressure at home; patient to call if consistently greater than 130/80 Continue DASH diet.   Reminder to go to the ER if any CP, SOB, nausea, dizziness, severe HA, changes vision/speech, left arm numbness and tingling and jaw pain.  Cholesterol Currently above goal; increase simvastatin from 10 mg to 40 mg daily  Continue low cholesterol diet and exercise.  Check lipid panel.   Prediabetes/T2DM with CKD 2 Defer metformin for A1C <7 per patient preference, working on weight loss/lifetyle Continue diet and exercise.  Perform daily foot/skin check, notify office of any concerning changes.  Check A1C  Morbid Obesity with co morbidities Long discussion about weight loss, diet, and exercise Recommended diet heavy in fruits and veggies and low in animal meats, cheeses, and dairy products, appropriate calorie intake Discussed ideal weight for height and initial weight goal (<320lb) Patient will work on restart walking Patient on phentermine with benefit, try 1/2 tab daily or every other day, doesn't like how it makes her feel Will follow up in 3 months  Vitamin D Def Below goal at last visit; she did changed dose continue supplementation to maintain goal of 70-100 Check Vit D level  Continue diet and meds as discussed. Further disposition pending results of labs. Discussed med's effects and SE's.   Over 30 minutes of exam, counseling, chart review, and critical decision making was performed.   Future Appointments  Date Time Provider [place]  05/12/2018  8:45 AM Liane Comber, NP [facility] None  11/11/2018  9:00 AM Liane Comber, NP [facility] None    ----------------------------------------------------------------------------------------------------------------------  HPI 57 y.o. female  presents for 3 month follow up on hypertension, cholesterol, diabetes, weight  and vitamin D deficiency.   she is prescribed phentermine for weight loss, but didn't start this.  While on the medication they have lost 0 lbs since last visit. They deny palpitations, anxiety, trouble sleeping, elevated BP.   BMI is Body mass index is 54.08 kg/m., she is working on diet and exercise, has been walking with her accountability partner (sister). She has increased her water intake, drinking 5 bottles of water daily. She has been focusing on increasing vegetable intake, daily greens, broccoli, decreasing red meat.  Wt Readings from Last 3 Encounters:  02/18/18 (!) 330 lb (149.7 kg)  11/14/17 (!) 330 lb (149.7 kg)  11/06/17 (!) 336 lb (152.4 kg)   Her blood pressure has been controlled at home, today their BP is BP: 127/88  She does workout. She denies chest pain, shortness of breath, dizziness.   She is on cholesterol medication Simvastatin 10 mg daily and denies myalgias. Her cholesterol is not at goal. The cholesterol last visit was:   Lab Results  Component Value Date   CHOL 206 (H) 11/06/2017   HDL 54 11/06/2017   LDLCALC 130 (H) 11/06/2017   TRIG 112 11/06/2017   CHOLHDL 3.8 11/06/2017    She has been working on diet and exercise for prediabetes/T2DM, and denies increased appetite, nausea, paresthesia of the feet, polydipsia, polyuria and visual disturbances. Last A1C in the office was:  Lab Results  Component Value Date   HGBA1C 6.7 (H) 11/06/2017   Patient is on Vitamin D supplement, taking 50000 three days a week.   Lab Results  Component Value Date   VD25OH 28 (L) 11/06/2017         Current Medications:  Current Outpatient Medications on File Prior to  Visit  Medication Sig  . cetirizine (ZYRTEC) 10 MG tablet Take 10 mg by mouth daily as needed for allergies.   . cyclobenzaprine (FLEXERIL) 10 MG tablet TAKE 1 PILL NIGHTLY AS NEEDED FOR TMJ/NECK PAIN  . ergocalciferol (VITAMIN D2) 50000 units capsule Take 1 capsule (50,000 Units total) by mouth 3 (three)  times a week.  . fluticasone (FLONASE) 50 MCG/ACT nasal spray Place 2 sprays into both nostrils at bedtime. (Patient taking differently: Place 2 sprays into both nostrils as needed. )  . olmesartan-hydrochlorothiazide (BENICAR HCT) 40-25 MG tablet Take 1 tablet by mouth daily.  . ranitidine (ZANTAC) 75 MG tablet Take 1 tablet (75 mg total) by mouth once as needed for heartburn.  . simvastatin (ZOCOR) 10 MG tablet TAKE 1 TABLET BY MOUTH EVERYDAY AT BEDTIME  . phentermine (ADIPEX-P) 37.5 MG tablet Take 1 tablet (37.5 mg total) by mouth daily before breakfast. (Patient not taking: Reported on 11/06/2017)   No current facility-administered medications on file prior to visit.      Allergies: No Known Allergies   Medical History:  Past Medical History:  Diagnosis Date  . Allergy   . Hyperlipidemia   . Hypertension   . Obesity   . Type II or unspecified type diabetes mellitus without mention of complication, not stated as uncontrolled    Family history- Reviewed and unchanged Social history- Reviewed and unchanged   Review of Systems:  Review of Systems  Constitutional: Negative for malaise/fatigue and weight loss.  HENT: Negative for congestion, hearing loss, sinus pain, sore throat and tinnitus.   Eyes: Negative for blurred vision and double vision.  Respiratory: Positive for cough. Negative for sputum production, shortness of breath and wheezing.   Cardiovascular: Negative for chest pain, palpitations, orthopnea, claudication and leg swelling.  Gastrointestinal: Negative for abdominal pain, blood in stool, constipation, diarrhea, heartburn, melena, nausea and vomiting.  Genitourinary: Negative.   Musculoskeletal: Negative for joint pain and myalgias.  Skin: Negative for rash.  Neurological: Negative for dizziness, tingling, sensory change, weakness and headaches.  Endo/Heme/Allergies: Negative for environmental allergies and polydipsia.  Psychiatric/Behavioral: Negative.   All  other systems reviewed and are negative.   Physical Exam: BP 127/88   Pulse 84   Temp (!) 97.3 F (36.3 C)   Ht 5' 5.5" (1.664 m)   Wt (!) 330 lb (149.7 kg)   SpO2 96%   BMI 54.08 kg/m  Wt Readings from Last 3 Encounters:  02/18/18 (!) 330 lb (149.7 kg)  11/14/17 (!) 330 lb (149.7 kg)  11/06/17 (!) 336 lb (152.4 kg)   General Appearance: Well nourished, in no apparent distress. Eyes: PERRLA, EOMs, conjunctiva no swelling or erythema Sinuses: No Frontal/maxillary tenderness ENT/Mouth: Ext aud canals clear, TMs without erythema, bulging. No erythema, swelling, or exudate on post pharynx.  Tonsils not swollen or erythematous. Hearing normal.  Neck: Supple, thyroid normal.  Respiratory: Respiratory effort normal, BS equal bilaterally without rales, rhonchi, wheezing or stridor.  Cardio: RRR with no MRGs. Brisk peripheral pulses without edema.  Abdomen: Soft, + BS.  Non tender, no guarding, rebound, hernias, masses. Lymphatics: Non tender without lymphadenopathy.  Musculoskeletal: Full ROM, 5/5 strength, Normal gait Skin: Warm, dry without rashes, lesions, ecchymosis.  Neuro: Cranial nerves intact. No cerebellar symptoms.  Psych: Awake and oriented X 3, normal affect, Insight and Judgment appropriate.    Izora Ribas, NP 9:45 AM [facility]

## 2018-02-18 ENCOUNTER — Encounter: Payer: Self-pay | Admitting: Adult Health

## 2018-02-18 ENCOUNTER — Ambulatory Visit (INDEPENDENT_AMBULATORY_CARE_PROVIDER_SITE_OTHER): Payer: 59 | Admitting: Adult Health

## 2018-02-18 VITALS — BP 127/88 | HR 84 | Temp 97.3°F | Ht 65.5 in | Wt 330.0 lb

## 2018-02-18 DIAGNOSIS — R7303 Prediabetes: Secondary | ICD-10-CM | POA: Diagnosis not present

## 2018-02-18 DIAGNOSIS — N182 Chronic kidney disease, stage 2 (mild): Secondary | ICD-10-CM

## 2018-02-18 DIAGNOSIS — I10 Essential (primary) hypertension: Secondary | ICD-10-CM

## 2018-02-18 DIAGNOSIS — E782 Mixed hyperlipidemia: Secondary | ICD-10-CM

## 2018-02-18 DIAGNOSIS — Z79899 Other long term (current) drug therapy: Secondary | ICD-10-CM

## 2018-02-18 DIAGNOSIS — E559 Vitamin D deficiency, unspecified: Secondary | ICD-10-CM | POA: Diagnosis not present

## 2018-02-18 MED ORDER — SIMVASTATIN 40 MG PO TABS: 40.0000 mg | ORAL_TABLET | Freq: Every day | ORAL | 1 refills | Status: DC

## 2018-02-18 NOTE — Patient Instructions (Signed)
Goals    . Blood Pressure < 130/80    . DIET - INCREASE WATER INTAKE     Aim for 65-80+ fluid ounces daily     . Exercise 150 min/wk Moderate Activity    . Weight (lb) < 320 lb (145.2 kg)        Drink 1/2 your body weight in fluid ounces of water daily; drink a tall glass of water 30 min before meals  Don't eat until you're stuffed- listen to your stomach and eat until you are 80% full   Try eating off of a salad plate; wait 10 min after finishing before going back for seconds  Start by eating the vegetables on your plate; aim for 50% of your meals to be fruits or vegetables  Then eat your protein - lean meats (grass fed if possible), fish, beans, nuts in moderation  Eat your carbs/starch last ONLY if you still are hungry. If you can, stop before finishing it all  Avoid sugar and flour - the closer it looks to it's original form in nature, typically the better it is for you  Splurge in moderation - "assign" days when you get to splurge and have the "bad stuff" - I like to follow a 80% - 20% plan- "good" choices 80 % of the time, "bad" choices in moderation 20% of the time  Simple equation is: Calories out > calories in = weight loss - even if you eat the bad stuff, if you limit portions, you will still lose weight    Common causes of cough OR hoarseness OR sore throat:   Allergies, Viral Infections, Acid Reflux and Bacterial Infections.   Allergies and viral infections cause a cough OR sore throat by post nasal drip and are often worse at night, can also have sneezing, lower grade fevers, clear/yellow mucus. This is best treated with allergy medications or nasal sprays.  Please get on allegra for 1-2 weeks The strongest is allegra or fexafinadine  Cheapest at [employer], [employer], [employer]   Bacterial infections are more severe than allergies or viral infections with fever, teeth pain, fatigue. This can be treated with prednisone and the same over the counter medication and after 7  days can be treated with an antibiotic.   Silent reflux/GERD can cause a cough OR sore throat OR hoarseness WITHOUT heart burn because the esophagus that goes to the stomach and trachea that goes to the lungs are very close and when you lay down the acid can irritate your throat and lungs. This can cause hoarseness, cough, and wheezing. Please stop any alcohol or anti-inflammatories like aleve/advil/ibuprofen and start an over the counter Prilosec or omeprazole 1-2 times daily 7mins before food for 2 weeks, then switch to over the counter zantac/ratinidine or pepcid/famotadine once at night for 2 weeks.    sometimes irritation causes more irritation. Try voice rest, use sugar free cough drops to prevent coughing, and try to stop clearing your throat.   If you ever have a cough that does not go away after trying these things please make a follow up visit for further evaluation or we can refer you to a specialist. Or if you ever have shortness of breath or chest pain go to the ER.

## 2018-02-19 ENCOUNTER — Other Ambulatory Visit: Payer: Self-pay | Admitting: Adult Health

## 2018-02-19 LAB — COMPLETE METABOLIC PANEL WITH GFR
AG Ratio: 1.6 (calc) (ref 1.0–2.5)
ALBUMIN MSPROF: 4.2 g/dL (ref 3.6–5.1)
ALT: 28 U/L (ref 6–29)
AST: 31 U/L (ref 10–35)
Alkaline phosphatase (APISO): 42 U/L (ref 33–130)
BUN: 15 mg/dL (ref 7–25)
CO2: 31 mmol/L (ref 20–32)
CREATININE: 0.82 mg/dL (ref 0.50–1.05)
Calcium: 9.5 mg/dL (ref 8.6–10.4)
Chloride: 103 mmol/L (ref 98–110)
GFR, EST AFRICAN AMERICAN: 93 mL/min/{1.73_m2} (ref >=60)
GFR, Est Non African American: 81 mL/min/{1.73_m2} (ref >=60)
Globulin: 2.6 g/dL (calc) (ref 1.9–3.7)
Glucose, Bld: 111 mg/dL — ABNORMAL HIGH (ref 65–99)
POTASSIUM: 4.1 mmol/L (ref 3.5–5.3)
Sodium: 144 mmol/L (ref 135–146)
TOTAL PROTEIN: 6.8 g/dL (ref 6.1–8.1)
Total Bilirubin: 0.5 mg/dL (ref 0.2–1.2)

## 2018-02-19 LAB — CBC WITH DIFFERENTIAL/PLATELET
Basophils Absolute: 18 cells/uL (ref 0–200)
Basophils Relative: 0.4 %
Eosinophils Absolute: 51 cells/uL (ref 15–500)
Eosinophils Relative: 1.1 %
HCT: 39.7 % (ref 35.0–45.0)
Hemoglobin: 13.3 g/dL (ref 11.7–15.5)
Lymphs Abs: 2314 cells/uL (ref 850–3900)
MCH: 28.9 pg (ref 27.0–33.0)
MCHC: 33.5 g/dL (ref 32.0–36.0)
MCV: 86.3 fL (ref 80.0–100.0)
MPV: 11 fL (ref 7.5–12.5)
Monocytes Relative: 9 %
Neutro Abs: 1803 cells/uL (ref 1500–7800)
Neutrophils Relative %: 39.2 %
PLATELETS: 318 10*3/uL (ref 140–400)
RBC: 4.6 10*6/uL (ref 3.80–5.10)
RDW: 13.5 % (ref 11.0–15.0)
TOTAL LYMPHOCYTE: 50.3 %
WBC: 4.6 10*3/uL (ref 3.8–10.8)
WBCMIX: 414 {cells}/uL (ref 200–950)

## 2018-02-19 LAB — MAGNESIUM: Magnesium: 1.8 mg/dL (ref 1.5–2.5)

## 2018-02-19 LAB — LIPID PANEL
CHOL/HDL RATIO: 3.9 (calc) (ref <5.0)
CHOLESTEROL: 219 mg/dL — AB (ref <200)
HDL: 56 mg/dL (ref >50)
LDL CHOLESTEROL (CALC): 139 mg/dL — AB
NON-HDL CHOLESTEROL (CALC): 163 mg/dL — AB (ref <130)
Triglycerides: 119 mg/dL (ref <150)

## 2018-02-19 LAB — TSH: TSH: 2.6 m[IU]/L

## 2018-02-19 LAB — HEMOGLOBIN A1C
EAG (MMOL/L): 7.6 (calc)
Hgb A1c MFr Bld: 6.4 % of total Hgb — ABNORMAL HIGH (ref <5.7)
Mean Plasma Glucose: 137 (calc)

## 2018-02-19 LAB — VITAMIN D 25 HYDROXY (VIT D DEFICIENCY, FRACTURES): Vit D, 25-Hydroxy: 25 ng/mL — ABNORMAL LOW (ref 30–100)

## 2018-02-19 MED ORDER — ROSUVASTATIN CALCIUM 40 MG PO TABS: 40.0000 mg | ORAL_TABLET | Freq: Every day | ORAL | 1 refills | Status: DC

## 2018-02-19 MED ORDER — ERGOCALCIFEROL 1.25 MG (50000 UT) PO CAPS: ORAL_CAPSULE | ORAL | 3 refills | Status: DC

## 2018-03-18 ENCOUNTER — Telehealth: Payer: Self-pay

## 2018-03-18 ENCOUNTER — Other Ambulatory Visit: Payer: Self-pay | Admitting: Adult Health

## 2018-03-18 MED ORDER — PREDNISONE 20 MG PO TABS: ORAL_TABLET | ORAL | 0 refills | Status: DC

## 2018-03-18 MED ORDER — AZITHROMYCIN 250 MG PO TABS: ORAL_TABLET | ORAL | 1 refills | Status: AC

## 2018-03-18 MED ORDER — PROMETHAZINE-DM 6.25-15 MG/5ML PO SYRP: 5.0000 mL | ORAL_SOLUTION | Freq: Four times a day (QID) | ORAL | 1 refills | Status: DC | PRN

## 2018-03-18 NOTE — Telephone Encounter (Signed)
Patient complaining of cough,sore throat and sinus problem. Mucus is green in color. Has been sick for about 1 month. Has taken Delsyum. Patient states she was seen in December, had a cough then but didn't have any mucus at that time. Requesting a prescription.

## 2018-03-19 NOTE — Telephone Encounter (Signed)
Patient notified

## 2018-03-20 NOTE — Telephone Encounter (Signed)
Pharmacy states that Promethazine DM is on backorder. Please advise

## 2018-03-25 NOTE — Telephone Encounter (Signed)
Patient has been taking OTC cough syrup and is doing better.

## 2018-04-30 ENCOUNTER — Other Ambulatory Visit: Payer: Self-pay | Admitting: Adult Health

## 2018-05-12 ENCOUNTER — Ambulatory Visit: Payer: Self-pay | Admitting: Adult Health

## 2018-07-21 ENCOUNTER — Other Ambulatory Visit: Payer: Self-pay

## 2018-07-21 MED ORDER — OLMESARTAN MEDOXOMIL-HCTZ 40-25 MG PO TABS: 1.0000 | ORAL_TABLET | Freq: Every day | ORAL | 0 refills | Status: DC

## 2018-07-21 NOTE — Telephone Encounter (Signed)
Olmesartan refill request.

## 2018-07-22 NOTE — Telephone Encounter (Signed)
Send in separate components at same dose. # 90 of each without refill. Thanks

## 2018-07-22 NOTE — Telephone Encounter (Signed)
Olmesartan-HCTZ is on backorder, requesting an alternative. Please advise

## 2018-07-24 ENCOUNTER — Other Ambulatory Visit: Payer: Self-pay

## 2018-07-24 MED ORDER — OLMESARTAN MEDOXOMIL 40 MG PO TABS: 40.0000 mg | ORAL_TABLET | Freq: Every day | ORAL | 0 refills | Status: DC

## 2018-07-24 MED ORDER — HYDROCHLOROTHIAZIDE 25 MG PO TABS: 25.0000 mg | ORAL_TABLET | Freq: Every day | ORAL | 0 refills | Status: DC

## 2018-07-24 MED ORDER — OLMESARTAN MEDOXOMIL-HCTZ 40-25 MG PO TABS: 1.0000 | ORAL_TABLET | Freq: Every day | ORAL | 0 refills | Status: DC

## 2018-07-24 NOTE — Addendum Note (Signed)
Addended by: Chancy Hurter on: 07/24/2018 09:01 AM   Modules accepted: Orders

## 2018-10-19 ENCOUNTER — Other Ambulatory Visit: Payer: Self-pay | Admitting: Adult Health

## 2018-11-10 NOTE — Progress Notes (Signed)
Patient No Show.

## 2018-11-10 NOTE — Patient Instructions (Signed)
Vit D  & Vit C 1,000 mg   are recommended to help protect  against the Covid-19 and other Corona viruses.    Also it's recommended  to take  Zinc 50 mg  to help  protect against the Covid-19   and best place to get  is also on [employer]  and don't pay more than 6-8 cents /pill !  ================================ Coronavirus (COVID-19) Are you at risk?  Are you at risk for the Coronavirus (COVID-19)?  To be considered HIGH RISK for Coronavirus (COVID-19), you have to meet the following criteria:  . Traveled to [place], [place], [place], [place] or [place]; or in the [place] to [place], [place], [place]  . or [place]; and have fever, cough, and shortness of breath within the last 2 weeks of travel OR . Been in close contact with a person diagnosed with COVID-19 within the last 2 weeks and have  . fever, cough,and shortness of breath .  . IF YOU DO NOT MEET THESE CRITERIA, YOU ARE CONSIDERED LOW RISK FOR COVID-19.  What to do if you are HIGH RISK for COVID-19?  . If you are having a medical emergency, call 911. . Seek medical care right away. Before you go to a doctor's office, urgent care or emergency department, .  call ahead and tell them about your recent travel, contact with someone diagnosed with COVID-19  .  and your symptoms.  . You should receive instructions from your physician's office regarding next steps of care.  . When you arrive at healthcare provider, tell the healthcare staff immediately you have returned from  . visiting [place], [place], [place], [place] or [place]; or traveled in the [place] to [place], [place],  . [place] or [place] in the last two weeks or you have been in close contact with a person diagnosed with  . COVID-19 in the last 2 weeks.   . Tell the health care staff about your symptoms: fever, cough and shortness of breath. . After you have been seen by a medical provider, you will be either: o Tested for (COVID-19) and  discharged home on quarantine except to seek medical care if  o symptoms worsen, and asked to  - Stay home and avoid contact with others until you get your results (4-5 days)  - Avoid travel on public transportation if possible (such as bus, train, or airplane) or o Sent to the Emergency Department by EMS for evaluation, COVID-19 testing  and  o possible admission depending on your condition and test results.  What to do if you are LOW RISK for COVID-19?  Reduce your risk of any infection by using the same precautions used for avoiding the common cold or flu:  . Wash your hands often with soap and warm water for at least 20 seconds.  If soap and water are not readily available,  . use an alcohol-based hand sanitizer with at least 60% alcohol.  . If coughing or sneezing, cover your mouth and nose by coughing or sneezing into the elbow areas of your shirt or coat, .  into a tissue or into your sleeve (not your hands). . Avoid shaking hands with others and consider head nods or verbal greetings only. . Avoid touching your eyes, nose, or mouth with unwashed hands.  . Avoid close contact with people who are sick. . Avoid places or events with large numbers of people in one location, like concerts or sporting events. .   Carefully consider travel plans you have or are making. . If you are planning any travel outside or inside the [place], visit the CDC's Travelers' Health webpage for the latest health notices. . If you have some symptoms but not all symptoms, continue to monitor at home and seek medical attention  . if your symptoms worsen. . If you are having a medical emergency, call 911. >>>>>>>>>>>>>>>>>>>>>>> Preventive Care for Adults  A healthy lifestyle and preventive care can promote health and wellness. Preventive health guidelines for women include the following key practices.  A routine yearly physical is a good way to check with your health care provider about your health and preventive  screening. It is a chance to share any concerns and updates on your health and to receive a thorough exam.  Visit your dentist for a routine exam and preventive care every 6 months. Brush your teeth twice a day and floss once a day. Good oral hygiene prevents tooth decay and gum disease.  The frequency of eye exams is based on your age, health, family medical history, use of contact lenses, and other factors. Follow your health care provider's recommendations for frequency of eye exams.  Eat a healthy diet. Foods like vegetables, fruits, whole grains, low-fat dairy products, and lean protein foods contain the nutrients you need without too many calories. Decrease your intake of foods high in solid fats, added sugars, and salt. Eat the right amount of calories for you. Get information about a proper diet from your health care provider, if necessary.  Regular physical exercise is one of the most important things you can do for your health. Most adults should get at least 150 minutes of moderate-intensity exercise (any activity that increases your heart rate and causes you to sweat) each week. In addition, most adults need muscle-strengthening exercises on 2 or more days a week.  Maintain a healthy weight. The body mass index (BMI) is a screening tool to identify possible weight problems. It provides an estimate of body fat based on height and weight. Your health care provider can find your BMI and can help you achieve or maintain a healthy weight. For adults 20 years and older:  A BMI below 18.5 is considered underweight.  A BMI of 18.5 to 24.9 is normal.  A BMI of 25 to 29.9 is considered overweight.  A BMI of 30 and above is considered obese.  Maintain normal blood lipids and cholesterol levels by exercising and minimizing your intake of saturated fat. Eat a balanced diet with plenty of fruit and vegetables. Blood tests for lipids and cholesterol should begin at age 20 and be repeated every 5  years. If your lipid or cholesterol levels are high, you are over 50, or you are at high risk for heart disease, you may need your cholesterol levels checked more frequently. Ongoing high lipid and cholesterol levels should be treated with medicines if diet and exercise are not working.  If you smoke, find out from your health care provider how to quit. If you do not use tobacco, do not start.  Lung cancer screening is recommended for adults aged 55-80 years who are at high risk for developing lung cancer because of a history of smoking. A yearly low-dose CT scan of the lungs is recommended for people who have at least a 30-pack-year history of smoking and are a current smoker or have quit within the past 15 years. A pack year of smoking is smoking an average of 1 pack   average of 1 pack of cigarettes a day for 1 year (for example: 1 pack a day for 30 years or 2 packs a day for 15 years). Yearly screening should continue until the smoker has stopped smoking for at least 15 years. Yearly screening should be stopped for people who develop a health problem that would prevent them from having lung cancer treatment.  High blood pressure causes heart disease and increases the risk of stroke. Your blood pressure should be checked at least every 1 to 2 years. Ongoing high blood pressure should be treated with medicines if weight loss and exercise do not work.  If you are 24-24 years old, ask your health care provider if you should take aspirin to prevent strokes.  Diabetes screening involves taking a blood sample to check your fasting blood sugar level. This should be done once every 3 years, after age 37, if you are within normal weight and without risk factors for diabetes. Testing should be considered at a younger age or be carried out more frequently if you are overweight and have at least 1 risk factor for diabetes.  Breast cancer screening is essential preventive care for women. You should practice "breast  self-awareness." This means understanding the normal appearance and feel of your breasts and may include breast self-examination. Any changes detected, no matter how small, should be reported to a health care provider. Women in their 68s and 30s should have a clinical breast exam (CBE) by a health care provider as part of a regular health exam every 1 to 3 years. After age 59, women should have a CBE every year. Starting at age 55, women should consider having a mammogram (breast X-ray test) every year. Women who have a family history of breast cancer should talk to their health care provider about genetic screening. Women at a high risk of breast cancer should talk to their health care providers about having an MRI and a mammogram every year.  Breast cancer gene (BRCA)-related cancer risk assessment is recommended for women who have family members with BRCA-related cancers. BRCA-related cancers include breast, ovarian, tubal, and peritoneal cancers. Having family members with these cancers may be associated with an increased risk for harmful changes (mutations) in the breast cancer genes BRCA1 and BRCA2. Results of the assessment will determine the need for genetic counseling and BRCA1 and BRCA2 testing.  Routine pelvic exams to screen for cancer are no longer recommended for nonpregnant women who are considered low risk for cancer of the pelvic organs (ovaries, uterus, and vagina) and who do not have symptoms. Ask your health care provider if a screening pelvic exam is right for you.  If you have had past treatment for cervical cancer or a condition that could lead to cancer, you need Pap tests and screening for cancer for at least 20 years after your treatment. If Pap tests have been discontinued, your risk factors (such as having a new sexual partner) need to be reassessed to determine if screening should be resumed. Some women have medical problems that increase the chance of getting cervical cancer. In  these cases, your health care provider may recommend more frequent screening and Pap tests.  Colorectal cancer can be detected and often prevented. Most routine colorectal cancer screening begins at the age of 49 years and continues through age 51 years. However, your health care provider may recommend screening at an earlier age if you have risk factors for colon cancer. On a yearly basis, your health care  test kits to check for hidden blood in the stool. Use of a small camera at the end of a tube, to directly examine the colon (sigmoidoscopy or colonoscopy), can detect the earliest forms of colorectal cancer. Talk to your health care provider about this at age 50, when routine screening begins.  Direct exam of the colon should be repeated every 5-10 years through age 75 years, unless early forms of pre-cancerous polyps or small growths are found.  Hepatitis C blood testing is recommended for all people born from 1945 through 1965 and any individual with known risks for hepatitis C.  Pra  Osteoporosis is a disease in which the bones lose minerals and strength with aging. This can result in serious bone fractures or breaks. The risk of osteoporosis can be identified using a bone density scan. Women ages 65 years and over and women at risk for fractures or osteoporosis should discuss screening with their health care providers. Ask your health care provider whether you should take a calcium supplement or vitamin D to reduce the rate of osteoporosis.  Menopause can be associated with physical symptoms and risks. Hormone replacement therapy is available to decrease symptoms and risks. You should talk to your health care provider about whether hormone replacement therapy is right for you.  Use sunscreen. Apply sunscreen liberally and repeatedly throughout the day. You should seek shade when your shadow is shorter than you. Protect yourself by wearing long sleeves, pants, a wide-brimmed hat, and  sunglasses year round, whenever you are outdoors.  Once a month, do a whole body skin exam, using a mirror to look at the skin on your back. Tell your health care provider of new moles, moles that have irregular borders, moles that are larger than a pencil eraser, or moles that have changed in shape or color.  Stay current with required vaccines (immunizations).  Influenza vaccine. All adults should be immunized every year.  Tetanus, diphtheria, and acellular pertussis (Td, Tdap) vaccine. Pregnant women should receive 1 dose of Tdap vaccine during each pregnancy. The dose should be obtained regardless of the length of time since the last dose. Immunization is preferred during the 27th-36th week of gestation. An adult who has not previously received Tdap or who does not know her vaccine status should receive 1 dose of Tdap. This initial dose should be followed by tetanus and diphtheria toxoids (Td) booster doses every 10 years. Adults with an unknown or incomplete history of completing a 3-dose immunization series with Td-containing vaccines should begin or complete a primary immunization series including a Tdap dose. Adults should receive a Td booster every 10 years.  Varicella vaccine. An adult without evidence of immunity to varicella should receive 2 doses or a second dose if she has previously received 1 dose. Pregnant females who do not have evidence of immunity should receive the first dose after pregnancy. This first dose should be obtained before leaving the health care facility. The second dose should be obtained 4-8 weeks after the first dose.  Human papillomavirus (HPV) vaccine. Females aged 13-26 years who have not received the vaccine previously should obtain the 3-dose series. The vaccine is not recommended for use in pregnant females. However, pregnancy testing is not needed before receiving a dose. If a female is found to be pregnant after receiving a dose, no treatment is needed. In that  case, the remaining doses should be delayed until after the pregnancy. Immunization is recommended for any person with an immunocompromised condition through the   age of 26 years if she did not get any or all doses earlier. During the 3-dose series, the second dose should be obtained 4-8 weeks after the first dose. The third dose should be obtained 24 weeks after the first dose and 16 weeks after the second dose.  Zoster vaccine. One dose is recommended for adults aged 60 years or older unless certain conditions are present.  Measles, mumps, and rubella (MMR) vaccine. Adults born before 1957 generally are considered immune to measles and mumps. Adults born in 1957 or later should have 1 or more doses of MMR vaccine unless there is a contraindication to the vaccine or there is laboratory evidence of immunity to each of the three diseases. A routine second dose of MMR vaccine should be obtained at least 28 days after the first dose for students attending postsecondary schools, health care workers, or international travelers. People who received inactivated measles vaccine or an unknown type of measles vaccine during 1963-1967 should receive 2 doses of MMR vaccine. People who received inactivated mumps vaccine or an unknown type of mumps vaccine before 1979 and are at high risk for mumps infection should consider immunization with 2 doses of MMR vaccine. For females of childbearing age, rubella immunity should be determined. If there is no evidence of immunity, females who are not pregnant should be vaccinated. If there is no evidence of immunity, females who are pregnant should delay immunization until after pregnancy. Unvaccinated health care workers born before 1957 who lack laboratory evidence of measles, mumps, or rubella immunity or laboratory confirmation of disease should consider measles and mumps immunization with 2 doses of MMR vaccine or rubella immunization with 1 dose of MMR vaccine.  Pneumococcal  13-valent conjugate (PCV13) vaccine. When indicated, a person who is uncertain of her immunization history and has no record of immunization should receive the PCV13 vaccine. An adult aged 19 years or older who has certain medical conditions and has not been previously immunized should receive 1 dose of PCV13 vaccine. This PCV13 should be followed with a dose of pneumococcal polysaccharide (PPSV23) vaccine. The PPSV23 vaccine dose should be obtained at least 1 or more year(s) after the dose of PCV13 vaccine. An adult aged 19 years or older who has certain medical conditions and previously received 1 or more doses of PPSV23 vaccine should receive 1 dose of PCV13. The PCV13 vaccine dose should be obtained 1 or more years after the last PPSV23 vaccine dose.    Pneumococcal polysaccharide (PPSV23) vaccine. When PCV13 is also indicated, PCV13 should be obtained first. All adults aged 65 years and older should be immunized. An adult younger than age 65 years who has certain medical conditions should be immunized. Any person who resides in a nursing home or long-term care facility should be immunized. An adult smoker should be immunized. People with an immunocompromised condition and certain other conditions should receive both PCV13 and PPSV23 vaccines. People with human immunodeficiency virus (HIV) infection should be immunized as soon as possible after diagnosis. Immunization during chemotherapy or radiation therapy should be avoided. Routine use of PPSV23 vaccine is not recommended for American Indians, [place] Natives, or people younger than 65 years unless there are medical conditions that require PPSV23 vaccine. When indicated, people who have unknown immunization and have no record of immunization should receive PPSV23 vaccine. One-time revaccination 5 years after the first dose of PPSV23 is recommended for people aged 19-64 years who have chronic kidney failure, nephrotic syndrome, asplenia, or  immunocompromised conditions.   People who received 1-2 doses of PPSV23 before age 65 years should receive another dose of PPSV23 vaccine at age 65 years or later if at least 5 years have passed since the previous dose. Doses of PPSV23 are not needed for people immunized with PPSV23 at or after age 65 years.  Preventive Services / Frequency   Ages 40 to 64 years  Blood pressure check.  Lipid and cholesterol check.  Lung cancer screening. / Every year if you are aged 55-80 years and have a 30-pack-year history of smoking and currently smoke or have quit within the past 15 years. Yearly screening is stopped once you have quit smoking for at least 15 years or develop a health problem that would prevent you from having lung cancer treatment.  Clinical breast exam.** / Every year after age 40 years.   BRCA-related cancer risk assessment.** / For women who have family members with a BRCA-related cancer (breast, ovarian, tubal, or peritoneal cancers).  Mammogram.** / Every year beginning at age 40 years and continuing for as long as you are in good health. Consult with your health care provider.  Pap test.** / Every 3 years starting at age 30 years through age 65 or 70 years with a history of 3 consecutive normal Pap tests.  HPV screening.** / Every 3 years from ages 30 years through ages 65 to 70 years with a history of 3 consecutive normal Pap tests.  Fecal occult blood test (FOBT) of stool. / Every year beginning at age 50 years and continuing until age 75 years. You may not need to do this test if you get a colonoscopy every 10 years.  Flexible sigmoidoscopy or colonoscopy.** / Every 5 years for a flexible sigmoidoscopy or every 10 years for a colonoscopy beginning at age 50 years and continuing until age 75 years.  Hepatitis C blood test.** / For all people born from 1945 through 1965 and any individual with known risks for hepatitis C.  Skin self-exam. / Monthly.  Influenza vaccine. /  Every year.  Tetanus, diphtheria, and acellular pertussis (Tdap/Td) vaccine.** / Consult your health care provider. Pregnant women should receive 1 dose of Tdap vaccine during each pregnancy. 1 dose of Td every 10 years.  Varicella vaccine.** / Consult your health care provider. Pregnant females who do not have evidence of immunity should receive the first dose after pregnancy.  Zoster vaccine.** / 1 dose for adults aged 60 years or older.  Pneumococcal 13-valent conjugate (PCV13) vaccine.** / Consult your health care provider.  Pneumococcal polysaccharide (PPSV23) vaccine.** / 1 to 2 doses if you smoke cigarettes or if you have certain conditions.  Meningococcal vaccine.** / Consult your health care provider.  Hepatitis A vaccine.** / Consult your health care provider.  Hepatitis B vaccine.** / Consult your health care provider. Screening for abdominal aortic aneurysm (AAA)  by ultrasound is recommended for people over 50 who have history of high blood pressure or who are current or former smokers. ++++++++++++++++++ Recommend Adult Low Dose Aspirin or  coated  Aspirin 81 mg daily  To reduce risk of Colon Cancer 40 %,  Skin Cancer 26 % ,  Melanoma 46%  and  Pancreatic cancer 60% +++++++++++++++++++ Vitamin D goal  is between 70-100.  Please make sure that you are taking your Vitamin D as directed.  It is very important as a natural anti-inflammatory  helping hair, skin, and nails, as well as reducing stroke and heart attack risk.  It helps your bones and   helps your bones and helps with mood. It also decreases numerous cancer risks so please take it as directed.  Low Vit D is associated with a 200-300% higher risk for CANCER  and 200-300% higher risk for HEART   ATTACK  &  STROKE.   .....................................Marland Kitchen It is also associated with higher death rate at younger ages,  autoimmune diseases like Rheumatoid arthritis, Lupus, Multiple Sclerosis.    Also many other serious  conditions, like depression, Alzheimer's Dementia, infertility, muscle aches, fatigue, fibromyalgia - just to name a few. ++++++++++++++++++ Recommend the book "The END of DIETING" by Dr Excell Seltzer  & the book "The END of DIABETES " by Dr Excell Seltzer At [email or web address] - get book & Audio CD's    Being diabetic has a  300% increased risk for heart attack, stroke, cancer, and alzheimer- type vascular dementia. It is very important that you work harder with diet by avoiding all foods that are white. Avoid white rice (brown & wild rice is OK), white potatoes (sweetpotatoes in moderation is OK), White bread or wheat bread or anything made out of white flour like bagels, donuts, rolls, buns, biscuits, cakes, pastries, cookies, pizza crust, and pasta (made from white flour & egg whites) - vegetarian pasta or spinach or wheat pasta is OK. Multigrain breads like Arnold's or [employer], or multigrain sandwich thins or flatbreads.  Diet, exercise and weight loss can reverse and cure diabetes in the early stages.  Diet, exercise and weight loss is very important in the control and prevention of complications of diabetes which affects every system in your body, ie. Brain - dementia/stroke, eyes - glaucoma/blindness, heart - heart attack/heart failure, kidneys - dialysis, stomach - gastric paralysis, intestines - malabsorption, nerves - severe painful neuritis, circulation - gangrene & loss of a leg(s), and finally cancer and Alzheimers.    I recommend avoid fried & greasy foods,  sweets/candy, white rice (brown or wild rice or Quinoa is OK), white potatoes (sweet potatoes are OK) - anything made from white flour - bagels, doughnuts, rolls, buns, biscuits,white and wheat breads, pizza crust and traditional pasta made of white flour & egg white(vegetarian pasta or spinach or wheat pasta is OK).  Multi-grain bread is OK - like multi-grain flat bread or sandwich thins. Avoid alcohol in excess. Exercise is also  important.    Eat all the vegetables you want - avoid meat, especially red meat and dairy - especially cheese.  Cheese is the most concentrated form of trans-fats which is the worst thing to clog up our arteries. Veggie cheese is OK which can be found in the fresh produce section at [employer] or [employer] or [employer]  ++++++++++++++++++++++ DASH Eating Plan  DASH stands for "Dietary Approaches to Stop Hypertension."   The DASH eating plan is a healthy eating plan that has been shown to reduce high blood pressure (hypertension). Additional health benefits may include reducing the risk of type 2 diabetes mellitus, heart disease, and stroke. The DASH eating plan may also help with weight loss. WHAT DO I NEED TO KNOW ABOUT THE DASH EATING PLAN? For the DASH eating plan, you will follow these general guidelines:  Choose foods with a percent daily value for sodium of less than 5% (as listed on the food label).  Use salt-free seasonings or herbs instead of table salt or sea salt.  Check with your health care provider or pharmacist before using salt substitutes.  Eat lower-sodium products, often labeled as "lower sodium" or "no  fresh foods.  Eat more vegetables, fruits, and low-fat dairy products.  Choose whole grains. Look for the word "whole" as the first word in the ingredient list.  Choose fish   Limit sweets, desserts, sugars, and sugary drinks.  Choose heart-healthy fats.  Eat veggie cheese   Eat more home-cooked food and less restaurant, buffet, and fast food.  Limit fried foods.  Cook foods using methods other than frying.  Limit canned vegetables. If you do use them, rinse them well to decrease the sodium.  When eating at a restaurant, ask that your food be prepared with less salt, or no salt if possible.                      WHAT FOODS CAN I EAT? Read Dr Joel Fuhrman's books on The End of Dieting & The End of Diabetes  Grains Whole grain or whole wheat bread. Brown  rice. Whole grain or whole wheat pasta. Quinoa, bulgur, and whole grain cereals. Low-sodium cereals. Corn or whole wheat flour tortillas. Whole grain cornbread. Whole grain crackers. Low-sodium crackers.  Vegetables Fresh or frozen vegetables (raw, steamed, roasted, or grilled). Low-sodium or reduced-sodium tomato and vegetable juices. Low-sodium or reduced-sodium tomato sauce and paste. Low-sodium or reduced-sodium canned vegetables.   Fruits All fresh, canned (in natural juice), or frozen fruits.  Protein Products  All fish and seafood.  Dried beans, peas, or lentils. Unsalted nuts and seeds. Unsalted canned beans.  Dairy Low-fat dairy products, such as skim or 1% milk, 2% or reduced-fat cheeses, low-fat ricotta or cottage cheese, or plain low-fat yogurt. Low-sodium or reduced-sodium cheeses.  Fats and Oils Tub margarines without trans fats. Light or reduced-fat mayonnaise and salad dressings (reduced sodium). Avocado. Safflower, olive, or canola oils. Natural peanut or almond butter.  Other Unsalted popcorn and pretzels. The items listed above may not be a complete list of recommended foods or beverages. Contact your dietitian for more options.  ++++++++++++++++++  WHAT FOODS ARE NOT RECOMMENDED? Grains/ White flour or wheat flour White bread. White pasta. White rice. Refined cornbread. Bagels and croissants. Crackers that contain trans fat.  Vegetables  Creamed or fried vegetables. Vegetables in a . Regular canned vegetables. Regular canned tomato sauce and paste. Regular tomato and vegetable juices.  Fruits Dried fruits. Canned fruit in light or heavy syrup. Fruit juice.  Meat and Other Protein Products Meat in general - RED meat & White meat.  Fatty cuts of meat. Ribs, chicken wings, all processed meats as bacon, sausage, bologna, salami, fatback, hot dogs, bratwurst and packaged luncheon meats.  Dairy Whole or 2% milk, cream, half-and-half, and cream cheese. Whole-fat or  sweetened yogurt. Full-fat cheeses or blue cheese. Non-dairy creamers and whipped toppings. Processed cheese, cheese spreads, or cheese curds.  Condiments Onion and garlic salt, seasoned salt, table salt, and sea salt. Canned and packaged gravies. Worcestershire sauce. Tartar sauce. Barbecue sauce. Teriyaki sauce. Soy sauce, including reduced sodium. Steak sauce. Fish sauce. Oyster sauce. Cocktail sauce. Horseradish. Ketchup and mustard. Meat flavorings and tenderizers. Bouillon cubes. Hot sauce. Tabasco sauce. Marinades. Taco seasonings. Relishes.  Fats and Oils Butter, stick margarine, lard, shortening and bacon fat. Coconut, palm kernel, or palm oils. Regular salad dressings.  Pickles and olives. Salted popcorn and pretzels.  The items listed above may not be a complete list of foods and beverages to avoid.    

## 2018-11-11 ENCOUNTER — Encounter: Payer: Self-pay | Admitting: Adult Health

## 2018-11-11 ENCOUNTER — Encounter: Payer: Self-pay | Admitting: Adult Health Nurse Practitioner

## 2018-11-11 ENCOUNTER — Ambulatory Visit (INDEPENDENT_AMBULATORY_CARE_PROVIDER_SITE_OTHER): Payer: No Typology Code available for payment source | Admitting: Adult Health Nurse Practitioner

## 2018-11-11 DIAGNOSIS — Z5329 Procedure and treatment not carried out because of patient's decision for other reasons: Secondary | ICD-10-CM

## 2018-11-25 ENCOUNTER — Encounter: Payer: Self-pay | Admitting: Adult Health Nurse Practitioner

## 2018-11-25 ENCOUNTER — Ambulatory Visit (INDEPENDENT_AMBULATORY_CARE_PROVIDER_SITE_OTHER): Payer: 59 | Admitting: Adult Health Nurse Practitioner

## 2018-11-25 ENCOUNTER — Other Ambulatory Visit: Payer: Self-pay

## 2018-11-25 VITALS — BP 135/86 | HR 84 | Temp 97.2°F | Ht 65.5 in | Wt 334.0 lb

## 2018-11-25 DIAGNOSIS — Z79899 Other long term (current) drug therapy: Secondary | ICD-10-CM

## 2018-11-25 DIAGNOSIS — Z9109 Other allergy status, other than to drugs and biological substances: Secondary | ICD-10-CM

## 2018-11-25 DIAGNOSIS — R8290 Unspecified abnormal findings in urine: Secondary | ICD-10-CM

## 2018-11-25 DIAGNOSIS — E782 Mixed hyperlipidemia: Secondary | ICD-10-CM

## 2018-11-25 DIAGNOSIS — Z136 Encounter for screening for cardiovascular disorders: Secondary | ICD-10-CM

## 2018-11-25 DIAGNOSIS — E559 Vitamin D deficiency, unspecified: Secondary | ICD-10-CM

## 2018-11-25 DIAGNOSIS — R7309 Other abnormal glucose: Secondary | ICD-10-CM

## 2018-11-25 DIAGNOSIS — I10 Essential (primary) hypertension: Secondary | ICD-10-CM | POA: Diagnosis not present

## 2018-11-25 DIAGNOSIS — Z6841 Body Mass Index (BMI) 40.0 and over, adult: Secondary | ICD-10-CM

## 2018-11-25 DIAGNOSIS — Z0000 Encounter for general adult medical examination without abnormal findings: Secondary | ICD-10-CM

## 2018-11-25 DIAGNOSIS — Z1321 Encounter for screening for nutritional disorder: Secondary | ICD-10-CM

## 2018-11-25 DIAGNOSIS — Z1389 Encounter for screening for other disorder: Secondary | ICD-10-CM

## 2018-11-25 DIAGNOSIS — N182 Chronic kidney disease, stage 2 (mild): Secondary | ICD-10-CM

## 2018-11-25 NOTE — Progress Notes (Signed)
Complete Physical  Assessment and Plan:  Diagnoses and all orders for this visit:  Essential hypertension - continue medication: Olmesartan/HCTA 40/25mg  daily, DASH diet,  Discussed dietary and exercise modifications monitor at home.  Call if greater than 140/80   Mixed hyperlipidemia Cholesterol Continue medications: Crestor 40mg  Continue low cholesterol diet and exercise.  Check lipid panel.   CKD (chronic kidney disease) stage 2, GFR 60-89 ml/min Cholesterol Increase fluids  Avoid NSAIDS Blood pressure control Monitor sugars  Will continue to monitor  Vitamin D deficiency Continue supplementation Vitamin D50,000IU three days a week Check vitamin D level  Environmental allergies Doing well at this time Uses flonase PRN  Morbid obesity (HCC) BMI 50.0-59.0 Abnormal glucose Discussed dietary and exercise modifications Encouraged healthy behaviors and encouraged increase walking Encouraged including spouse  Medication management Continued  Screening for cardiovascular condition -EKG - NSR  Screening for blood or protein in urine -UA   Would like Influenza vaccination when we receive them.  Discussed med's effects and SE's. Screening labs and tests as requested with regular follow-up as recommended. Over 40 minutes of exam, counseling, chart review, and complex, high level critical decision making was performed this visit.   HPI  58 y.o. female  presents for a complete physical and follow up for has Prediabetes; Hypertension; Hyperlipidemia; Environmental allergies; Morbid obesity ([place]); History of colon polyps; Medication management; Vitamin D deficiency; and CKD (chronic kidney disease) stage 2, GFR 60-89 ml/min on their problem list..  Her blood pressure has been controlled at home, today their BP is BP: 135/86 She does workout. She denies chest pain, shortness of breath, dizziness.   She is on cholesterol medication and denies myalgias. Her cholesterol  is not at goal. The cholesterol last visit was:   Lab Results  Component Value Date   CHOL 219 (H) 02/18/2018   HDL 56 02/18/2018   LDLCALC 139 (H) 02/18/2018   TRIG 119 02/18/2018   CHOLHDL 3.9 02/18/2018    She has been working on diet and exercise for prediabetes, she is not on bASA, she is on ACE/ARB and denies nausea, paresthesia of the feet, polydipsia, polyuria, visual disturbances, vomiting and weight loss. Last A1C in the office was:  Lab Results  Component Value Date   HGBA1C 6.4 (H) 02/18/2018    Last GFR: Lab Results  Component Value Date   GFRNONAA 81 02/18/2018   Lab Results  Component Value Date   GFRAA 93 02/18/2018    Patient is on Vitamin D supplement.   Lab Results  Component Value Date   VD25OH 25 (L) 02/18/2018      Current Medications:  Current Outpatient Medications on File Prior to Visit  Medication Sig Dispense Refill  . cetirizine (ZYRTEC) 10 MG tablet Take 10 mg by mouth daily as needed for allergies.     . fluticasone (FLONASE) 50 MCG/ACT nasal spray Place 2 sprays into both nostrils at bedtime. (Patient taking differently: Place 2 sprays into both nostrils as needed. ) 16 g 1  . olmesartan-hydrochlorothiazide (BENICAR HCT) 40-25 MG tablet Take 1 tablet by mouth daily. Take 1 tablet Daily for BP & Fluid 90 tablet 0  . OVER THE COUNTER MEDICATION OTC acid reducer    . rosuvastatin (CRESTOR) 40 MG tablet Take 1 tablet (40 mg total) by mouth daily. 90 tablet 1  . Vitamin D, Ergocalciferol, (DRISDOL) 1.25 MG (50000 UT) CAPS capsule TAKE 1 CAPSULE (50,000 UNITS TOTAL) BY MOUTH 3 (THREE) TIMES A WEEK. 36 capsule 19  No current facility-administered medications on file prior to visit.    Allergies:  No Known Allergies Medical History:  She has Prediabetes; Hypertension; Hyperlipidemia; Environmental allergies; Morbid obesity ([place]); History of colon polyps; Medication management; Vitamin D deficiency; and CKD (chronic kidney disease) stage 2, GFR  60-89 ml/min on their problem list. Health Maintenance:   Immunization History  Administered Date(s) Administered  . Influenza,inj,quad, With Preservative 12/26/2015  . Influenza-Unspecified 01/28/2014  . PPD Test 08/19/2013  . Td 09/06/2003  . Tdap 08/19/2013    Tetanus: 2015 Pneumovax: N/A Prevnar 13: N/A Flu vaccine:  Zostavax: Discussed with patient LMP: postmenopausal Pap: 04/2018 MGM: 04/2018 - [facility] DEXA: N/A Colonoscopy: 2015, die 2025   Last Dental Exam: Dr Gerlene Burdock 2020  Last Eye Exam: 02/2018, due for 2020  Patient Care Team: Unk Pinto, MD as PCP - General (Internal Medicine) Molli Posey, MD as Consulting Physician (Obstetrics and Gynecology)  Surgical History:  She has a past surgical history that includes Cesarean section; Umbilical hernia repair; Tubal ligation; and Colonoscopy with propofol (N/A, 12/15/2013). Family History:  Herfamily history includes Alcohol abuse in her mother; Cancer in her father; Cancer (age of onset: 39) in her sister; Diabetes in her paternal grandmother and son; Hypertension in her mother. Social History:  She reports that she has never smoked. She has never used smokeless tobacco. She reports that she does not drink alcohol or use drugs.  Review of Systems: ROS  Physical Exam: Estimated body mass index is 54.74 kg/m as calculated from the following:   Height as of this encounter: 5' 5.5" (1.664 m).   Weight as of this encounter: 334 lb (151.5 kg). BP 135/86   Pulse 84   Temp (!) 97.2 F (36.2 C)   Ht 5' 5.5" (1.664 m)   Wt (!) 334 lb (151.5 kg)   SpO2 97%   BMI 54.74 kg/m  General Appearance: Well nourished, in no apparent distress.  Eyes: PERRLA, EOMs, conjunctiva no swelling or erythema, normal fundi and vessels.  Sinuses: No Frontal/maxillary tenderness  ENT/Mouth: Ext aud canals clear, normal light reflex with TMs without erythema, bulging. Good dentition. No erythema, swelling, or exudate on  post pharynx. Tonsils not swollen or erythematous. Hearing normal.  Neck: Supple, thyroid normal. No bruits  Respiratory: Respiratory effort normal, BS equal bilaterally without rales, rhonchi, wheezing or stridor.  Cardio: RRR without murmurs, rubs or gallops. Brisk peripheral pulses without edema.  Chest: symmetric, with normal excursions and percussion.  Breasts: Symmetric, without lumps, nipple discharge, retractions.  Abdomen: Soft obese, nontender, no guarding, rebound, hernias, masses, or organomegaly.  Lymphatics: Non tender without lymphadenopathy.  Genitourinary: defer Musculoskeletal: Full ROM all peripheral extremities,5/5 strength, and normal gait.  Skin: Warm, dry without rashes, lesions, ecchymosis. Neuro: Cranial nerves intact, reflexes equal bilaterally. Normal muscle tone, no cerebellar symptoms. Sensation intact.  Psych: Awake and oriented X 3, normal affect, Insight and Judgment appropriate.   EKG: WNL no ST changes.    Garnet Sierras 8:26 AM [facility]

## 2018-11-25 NOTE — Patient Instructions (Addendum)
For leg cramps take Magnesium 559m every night.  Increase water intake 80oz a day  Increase walking to promote stretching and strengthening of your leg muscles.  This can also help with cramps.   We will contact you in 1-3 days via phone with your lab results.     Vit D  & Vit C 1,000 mg   are recommended to help protect  against the Covid-19 and other Corona viruses.    Also it's recommended  to take  Zinc 50 mg  to help  protect against the Covid-19   and best place to get  is also on [employer]  and don't pay more than 6-8 cents /pill !  ================================ Coronavirus (COVID-19) Are you at risk?  Are you at risk for the Coronavirus (COVID-19)?  To be considered HIGH RISK for Coronavirus (COVID-19), you have to meet the following criteria:  . [place] to [place] or in [place] or [place] and have fever, cough, and shortness of breath within the last 2 weeks of travel OR . Been in close contact with a person diagnosed with COVID-19 within the last 2 weeks and have  . fever, cough,and shortness of breath .  . IF YOU DO NOT MEET THESE CRITERIA, YOU ARE CONSIDERED LOW RISK FOR COVID-19.  What to do if you are HIGH RISK for COVID-19?  .Marland KitchenIf you are having a medical emergency, call 911. . Seek medical care right away. Before you go to a doctor's office, urgent care or emergency department, .  call ahead and tell them about your recent travel, contact with someone diagnosed with COVID-19  .  and your symptoms.  . You should receive instructions from your physician's office regarding next steps of care.  . When you arrive at healthcare provider, tell the healthcare staff immediately you have returned from  . visiting [place] or traveled in [place] the last [place] or you  have been in close contact with a person diagnosed with  . COVID-19 in the last 2 weeks.   . Tell the health care staff about your symptoms: fever, cough and shortness of breath. . After you have been seen by a medical provider, you will be either: o Tested for (COVID-19) and discharged home on quarantine except to seek medical care if  o symptoms worsen, and asked to  - Stay home and avoid contact with others until you get your results (4-5 days)  - Avoid travel on public transportation if possible (such as bus, train, or airplane) or o Sent to the Emergency Department by EMS for evaluation, COVID-19 testing  and  o possible admission depending on your condition and test results.  What to do if you are LOW RISK for COVID-19?  Reduce your risk of any infection by using the same precautions used for avoiding the common cold or flu:  .Marland KitchenWash your hands often with soap and warm water for at least 20 seconds.  If soap and water are not readily available,  . use an alcohol-based hand sanitizer with at least 60% alcohol.  . If coughing or sneezing, cover your mouth and nose by coughing or sneezing into the elbow areas of your shirt or coat, .  into a tissue or into your sleeve (not your hands). .Marland Kitchen  Avoid shaking hands with others and consider head nods or verbal greetings only. . Avoid touching your eyes, nose, or mouth with unwashed hands.  . Avoid close contact with people who are sick. . Avoid places or events with large numbers of people in one location, like concerts or sporting events. . Carefully consider travel plans you have or are making. . If you are planning any travel outside or inside the [place], visit the CDC's Travelers' Health webpage for the latest health notices. . If you have some symptoms but not all symptoms, continue to monitor at home and seek medical attention  . if your symptoms worsen. . If you are having a medical emergency, call 911. >>>>>>>>>>>>>>>>>>>>>>> Preventive Care  for Adults  A healthy lifestyle and preventive care can promote health and wellness. Preventive health guidelines for women include the following key practices.  A routine yearly physical is a good way to check with your health care provider about your health and preventive screening. It is a chance to share any concerns and updates on your health and to receive a thorough exam.  Visit your dentist for a routine exam and preventive care every 6 months. Brush your teeth twice a day and floss once a day. Good oral hygiene prevents tooth decay and gum disease.  The frequency of eye exams is based on your age, health, family medical history, use of contact lenses, and other factors. Follow your health care provider's recommendations for frequency of eye exams.  Eat a healthy diet. Foods like vegetables, fruits, whole grains, low-fat dairy products, and lean protein foods contain the nutrients you need without too many calories. Decrease your intake of foods high in solid fats, added sugars, and salt. Eat the right amount of calories for you. Get information about a proper diet from your health care provider, if necessary.  Regular physical exercise is one of the most important things you can do for your health. Most adults should get at least 150 minutes of moderate-intensity exercise (any activity that increases your heart rate and causes you to sweat) each week. In addition, most adults need muscle-strengthening exercises on 2 or more days a week.  Maintain a healthy weight. The body mass index (BMI) is a screening tool to identify possible weight problems. It provides an estimate of body fat based on height and weight. Your health care provider can find your BMI and can help you achieve or maintain a healthy weight. For adults 20 years and older:  A BMI below 18.5 is considered underweight.  A BMI of 18.5 to 24.9 is normal.  A BMI of 25 to 29.9 is considered overweight.  A BMI of 30 and above is  considered obese.  Maintain normal blood lipids and cholesterol levels by exercising and minimizing your intake of saturated fat. Eat a balanced diet with plenty of fruit and vegetables. Blood tests for lipids and cholesterol should begin at age 82 and be repeated every 5 years. If your lipid or cholesterol levels are high, you are over 50, or you are at high risk for heart disease, you may need your cholesterol levels checked more frequently. Ongoing high lipid and cholesterol levels should be treated with medicines if diet and exercise are not working.  If you smoke, find out from your health care provider how to quit. If you do not use tobacco, do not start.  Lung cancer screening is recommended for adults aged 78-80 years who are at high risk for developing lung  cancer because of a history of smoking. A yearly low-dose CT scan of the lungs is recommended for people who have at least a 30-pack-year history of smoking and are a current smoker or have quit within the past 15 years. A pack year of smoking is smoking an average of 1 pack of cigarettes a day for 1 year (for example: 1 pack a day for 30 years or 2 packs a day for 15 years). Yearly screening should continue until the smoker has stopped smoking for at least 15 years. Yearly screening should be stopped for people who develop a health problem that would prevent them from having lung cancer treatment.  High blood pressure causes heart disease and increases the risk of stroke. Your blood pressure should be checked at least every 1 to 2 years. Ongoing high blood pressure should be treated with medicines if weight loss and exercise do not work.  If you are 40-37 years old, ask your health care provider if you should take aspirin to prevent strokes.  Diabetes screening involves taking a blood sample to check your fasting blood sugar level. This should be done once every 3 years, after age 59, if you are within normal weight and without risk factors  for diabetes. Testing should be considered at a younger age or be carried out more frequently if you are overweight and have at least 1 risk factor for diabetes.  Breast cancer screening is essential preventive care for women. You should practice "breast self-awareness." This means understanding the normal appearance and feel of your breasts and may include breast self-examination. Any changes detected, no matter how small, should be reported to a health care provider. Women in their 39s and 30s should have a clinical breast exam (CBE) by a health care provider as part of a regular health exam every 1 to 3 years. After age 93, women should have a CBE every year. Starting at age 59, women should consider having a mammogram (breast X-ray test) every year. Women who have a family history of breast cancer should talk to their health care provider about genetic screening. Women at a high risk of breast cancer should talk to their health care providers about having an MRI and a mammogram every year.  Breast cancer gene (BRCA)-related cancer risk assessment is recommended for women who have family members with BRCA-related cancers. BRCA-related cancers include breast, ovarian, tubal, and peritoneal cancers. Having family members with these cancers may be associated with an increased risk for harmful changes (mutations) in the breast cancer genes BRCA1 and BRCA2. Results of the assessment will determine the need for genetic counseling and BRCA1 and BRCA2 testing.  Routine pelvic exams to screen for cancer are no longer recommended for nonpregnant women who are considered low risk for cancer of the pelvic organs (ovaries, uterus, and vagina) and who do not have symptoms. Ask your health care provider if a screening pelvic exam is right for you.  If you have had past treatment for cervical cancer or a condition that could lead to cancer, you need Pap tests and screening for cancer for at least 20 years after your  treatment. If Pap tests have been discontinued, your risk factors (such as having a new sexual partner) need to be reassessed to determine if screening should be resumed. Some women have medical problems that increase the chance of getting cervical cancer. In these cases, your health care provider may recommend more frequent screening and Pap tests.  Colorectal cancer can be  detected and often prevented. Most routine colorectal cancer screening begins at the age of 40 years and continues through age 77 years. However, your health care provider may recommend screening at an earlier age if you have risk factors for colon cancer. On a yearly basis, your health care provider may provide home test kits to check for hidden blood in the stool. Use of a small camera at the end of a tube, to directly examine the colon (sigmoidoscopy or colonoscopy), can detect the earliest forms of colorectal cancer. Talk to your health care provider about this at age 17, when routine screening begins.  Direct exam of the colon should be repeated every 5-10 years through age 57 years, unless early forms of pre-cancerous polyps or small growths are found.  Hepatitis C blood testing is recommended for all people born from [birth year] through 1965 and any individual with known risks for hepatitis C.  Pra  Osteoporosis is a disease in which the bones lose minerals and strength with aging. This can result in serious bone fractures or breaks. The risk of osteoporosis can be identified using a bone density scan. Women ages 55 years and over and women at risk for fractures or osteoporosis should discuss screening with their health care providers. Ask your health care provider whether you should take a calcium supplement or vitamin D to reduce the rate of osteoporosis.  Menopause can be associated with physical symptoms and risks. Hormone replacement therapy is available to decrease symptoms and risks. You should talk to your health care provider  about whether hormone replacement therapy is right for you.  Use sunscreen. Apply sunscreen liberally and repeatedly throughout the day. You should seek shade when your shadow is shorter than you. Protect yourself by wearing long sleeves, pants, a wide-brimmed hat, and sunglasses year round, whenever you are outdoors.  Once a month, do a whole body skin exam, using a mirror to look at the skin on your back. Tell your health care provider of new moles, moles that have irregular borders, moles that are larger than a pencil eraser, or moles that have changed in shape or color.  Stay current with required vaccines (immunizations).  Influenza vaccine. All adults should be immunized every year.  Tetanus, diphtheria, and acellular pertussis (Td, Tdap) vaccine. Pregnant women should receive 1 dose of Tdap vaccine during each pregnancy. The dose should be obtained regardless of the length of time since the last dose. Immunization is preferred during the 27th-36th week of gestation. An adult who has not previously received Tdap or who does not know her vaccine status should receive 1 dose of Tdap. This initial dose should be followed by tetanus and diphtheria toxoids (Td) booster doses every 10 years. Adults with an unknown or incomplete history of completing a 3-dose immunization series with Td-containing vaccines should begin or complete a primary immunization series including a Tdap dose. Adults should receive a Td booster every 10 years.  Varicella vaccine. An adult without evidence of immunity to varicella should receive 2 doses or a second dose if she has previously received 1 dose. Pregnant females who do not have evidence of immunity should receive the first dose after pregnancy. This first dose should be obtained before leaving the health care facility. The second dose should be obtained 4-8 weeks after the first dose.  Human papillomavirus (HPV) vaccine. Females aged 13-26 years who have not received  the vaccine previously should obtain the 3-dose series. The vaccine is not recommended for use in  pregnant females. However, pregnancy testing is not needed before receiving a dose. If a female is found to be pregnant after receiving a dose, no treatment is needed. In that case, the remaining doses should be delayed until after the pregnancy. Immunization is recommended for any person with an immunocompromised condition through the age of 33 years if she did not get any or all doses earlier. During the 3-dose series, the second dose should be obtained 4-8 weeks after the first dose. The third dose should be obtained 24 weeks after the first dose and 16 weeks after the second dose.  Zoster vaccine. One dose is recommended for adults aged 13 years or older unless certain conditions are present.  Measles, mumps, and rubella (MMR) vaccine. Adults born before [birth year] generally are considered immune to measles and mumps. Adults born in [birth year] or later should have 1 or more doses of MMR vaccine unless there is a contraindication to the vaccine or there is laboratory evidence of immunity to each of the three diseases. A routine second dose of MMR vaccine should be obtained at least 28 days after the first dose for students attending postsecondary schools, health care workers, or international travelers. People who received inactivated measles vaccine or an unknown type of measles vaccine during 1963-1967 should receive 2 doses of MMR vaccine. People who received inactivated mumps vaccine or an unknown type of mumps vaccine before 1979 and are at high risk for mumps infection should consider immunization with 2 doses of MMR vaccine. For females of childbearing age, rubella immunity should be determined. If there is no evidence of immunity, females who are not pregnant should be vaccinated. If there is no evidence of immunity, females who are pregnant should delay immunization until after pregnancy. Unvaccinated health care  workers born before [birth year] who lack laboratory evidence of measles, mumps, or rubella immunity or laboratory confirmation of disease should consider measles and mumps immunization with 2 doses of MMR vaccine or rubella immunization with 1 dose of MMR vaccine.  Pneumococcal 13-valent conjugate (PCV13) vaccine. When indicated, a person who is uncertain of her immunization history and has no record of immunization should receive the PCV13 vaccine. An adult aged 52 years or older who has certain medical conditions and has not been previously immunized should receive 1 dose of PCV13 vaccine. This PCV13 should be followed with a dose of pneumococcal polysaccharide (PPSV23) vaccine. The PPSV23 vaccine dose should be obtained at least 1 or more year(s) after the dose of PCV13 vaccine. An adult aged 43 years or older who has certain medical conditions and previously received 1 or more doses of PPSV23 vaccine should receive 1 dose of PCV13. The PCV13 vaccine dose should be obtained 1 or more years after the last PPSV23 vaccine dose.    Pneumococcal polysaccharide (PPSV23) vaccine. When PCV13 is also indicated, PCV13 should be obtained first. All adults aged 37 years and older should be immunized. An adult younger than age 20 years who has certain medical conditions should be immunized. Any person who resides in a nursing home or long-term care facility should be immunized. An adult smoker should be immunized. People with an immunocompromised condition and certain other conditions should receive both PCV13 and PPSV23 vaccines. People with human immunodeficiency virus (HIV) infection should be immunized as soon as possible after diagnosis. Immunization during chemotherapy or radiation therapy should be avoided. Routine use of PPSV23 vaccine is not recommended for American Indians, [place] Natives, or people younger than 65 years unless there  are medical conditions that require PPSV23 vaccine. When indicated, people who have  unknown immunization and have no record of immunization should receive PPSV23 vaccine. One-time revaccination 5 years after the first dose of PPSV23 is recommended for people aged 19-64 years who have chronic kidney failure, nephrotic syndrome, asplenia, or immunocompromised conditions. People who received 1-2 doses of PPSV23 before age 81 years should receive another dose of PPSV23 vaccine at age 40 years or later if at least 5 years have passed since the previous dose. Doses of PPSV23 are not needed for people immunized with PPSV23 at or after age 79 years.  Preventive Services / Frequency   Ages 6 to 46 years  Blood pressure check.  Lipid and cholesterol check.  Lung cancer screening. / Every year if you are aged 58-80 years and have a 30-pack-year history of smoking and currently smoke or have quit within the past 15 years. Yearly screening is stopped once you have quit smoking for at least 15 years or develop a health problem that would prevent you from having lung cancer treatment.  Clinical breast exam.** / Every year after age 19 years.   BRCA-related cancer risk assessment.** / For women who have family members with a BRCA-related cancer (breast, ovarian, tubal, or peritoneal cancers).  Mammogram.** / Every year beginning at age 29 years and continuing for as long as you are in good health. Consult with your health care provider.  Pap test.** / Every 3 years starting at age 79 years through age 15 or 26 years with a history of 3 consecutive normal Pap tests.  HPV screening.** / Every 3 years from ages 38 years through ages 36 to 69 years with a history of 3 consecutive normal Pap tests.  Fecal occult blood test (FOBT) of stool. / Every year beginning at age 71 years and continuing until age 27 years. You may not need to do this test if you get a colonoscopy every 10 years.  Flexible sigmoidoscopy or colonoscopy.** / Every 5 years for a flexible sigmoidoscopy or every 10 years for a  colonoscopy beginning at age 46 years and continuing until age 67 years.  Hepatitis C blood test.** / For all people born from [birth year] through 1963 and any individual with known risks for hepatitis C.  Skin self-exam. / Monthly.  Influenza vaccine. / Every year.  Tetanus, diphtheria, and acellular pertussis (Tdap/Td) vaccine.** / Consult your health care provider. Pregnant women should receive 1 dose of Tdap vaccine during each pregnancy. 1 dose of Td every 10 years.  Varicella vaccine.** / Consult your health care provider. Pregnant females who do not have evidence of immunity should receive the first dose after pregnancy.  Zoster vaccine.** / 1 dose for adults aged 51 years or older.  Pneumococcal 13-valent conjugate (PCV13) vaccine.** / Consult your health care provider.  Pneumococcal polysaccharide (PPSV23) vaccine.** / 1 to 2 doses if you smoke cigarettes or if you have certain conditions.  Meningococcal vaccine.** / Consult your health care provider.  Hepatitis A vaccine.** / Consult your health care provider.  Hepatitis B vaccine.** / Consult your health care provider. Screening for abdominal aortic aneurysm (AAA)  by ultrasound is recommended for people over 50 who have history of high blood pressure or who are current or former smokers. ++++++++++++++++++ Recommend Adult Low Dose Aspirin or  coated  Aspirin 81 mg daily  To reduce risk of Colon Cancer 40 %,  Skin Cancer 26 % ,  Melanoma 46%  and  Pancreatic  cancer 60% +++++++++++++++++++ Vitamin D goal  is between 70-100.  Please make sure that you are taking your Vitamin D as directed.  It is very important as a natural anti-inflammatory  helping hair, skin, and nails, as well as reducing stroke and heart attack risk.  It helps your bones and helps with mood. It also decreases numerous cancer risks so please take it as directed.  Low Vit D is associated with a 200-300% higher risk for CANCER  and 200-300% higher risk  for HEART   ATTACK  &  STROKE.   .....................................Marland Kitchen It is also associated with higher death rate at younger ages,  autoimmune diseases like Rheumatoid arthritis, Lupus, Multiple Sclerosis.    Also many other serious conditions, like depression, Alzheimer's Dementia, infertility, muscle aches, fatigue, fibromyalgia - just to name a few. ++++++++++++++++++ Recommend the book "The END of DIETING" by Dr Excell Seltzer  & the book "The END of DIABETES " by Dr Excell Seltzer At [email or web address] - get book & Audio CD's    Being diabetic has a  300% increased risk for heart attack, stroke, cancer, and alzheimer- type vascular dementia. It is very important that you work harder with diet by avoiding all foods that are white. Avoid white rice (brown & wild rice is OK), white potatoes (sweetpotatoes in moderation is OK), White bread or wheat bread or anything made out of white flour like bagels, donuts, rolls, buns, biscuits, cakes, pastries, cookies, pizza crust, and pasta (made from white flour & egg whites) - vegetarian pasta or spinach or wheat pasta is OK. Multigrain breads like Arnold's or Pepper[employer], or multigrain sandwich thins or flatbreads.  Diet, exercise and weight loss can reverse and cure diabetes in the early stages.  Diet, exercise and weight loss is very important in the control and prevention of complications of diabetes which affects every system in your body, ie. Brain - dementia/stroke, eyes - glaucoma/blindness, heart - heart attack/heart failure, kidneys - dialysis, stomach - gastric paralysis, intestines - malabsorption, nerves - severe painful neuritis, circulation - gangrene & loss of a leg(s), and finally cancer and Alzheimers.    I recommend avoid fried & greasy foods,  sweets/candy, white rice (brown or wild rice or Quinoa is OK), white potatoes (sweet potatoes are OK) - anything made from white flour - bagels, doughnuts, rolls, buns, biscuits,white and wheat breads,  pizza crust and traditional pasta made of white flour & egg white(vegetarian pasta or spinach or wheat pasta is OK).  Multi-grain bread is OK - like multi-grain flat bread or sandwich thins. Avoid alcohol in excess. Exercise is also important.    Eat all the vegetables you want - avoid meat, especially red meat and dairy - especially cheese.  Cheese is the most concentrated form of trans-fats which is the worst thing to clog up our arteries. Veggie cheese is OK which can be found in the fresh produce section at [employer] or [employer] or [employer]  ++++++++++++++++++++++ DASH Eating Plan  DASH stands for "Dietary Approaches to Stop Hypertension."   The DASH eating plan is a healthy eating plan that has been shown to reduce high blood pressure (hypertension). Additional health benefits may include reducing the risk of type 2 diabetes mellitus, heart disease, and stroke. The DASH eating plan may also help with weight loss. WHAT DO I NEED TO KNOW ABOUT THE DASH EATING PLAN? For the DASH eating plan, you will follow these general guidelines:  Choose foods with a percent daily value  for sodium of less than 5% (as listed on the food label).  Use salt-free seasonings or herbs instead of table salt or sea salt.  Check with your health care provider or pharmacist before using salt substitutes.  Eat lower-sodium products, often labeled as "lower sodium" or "no salt added."  Eat fresh foods.  Eat more vegetables, fruits, and low-fat dairy products.  Choose whole grains. Look for the word "whole" as the first word in the ingredient list.  Choose fish   Limit sweets, desserts, sugars, and sugary drinks.  Choose heart-healthy fats.  Eat veggie cheese   Eat more home-cooked food and less restaurant, buffet, and fast food.  Limit fried foods.  Cook foods using methods other than frying.  Limit canned vegetables. If you do use them, rinse them well to decrease the sodium.  When eating  at a restaurant, ask that your food be prepared with less salt, or no salt if possible.                      WHAT FOODS CAN I EAT? Read Dr Fara Olden Fuhrman's books on The End of Dieting & The End of Diabetes  Grains Whole grain or whole wheat bread. Brown rice. Whole grain or whole wheat pasta. Quinoa, bulgur, and whole grain cereals. Low-sodium cereals. Corn or whole wheat flour tortillas. Whole grain cornbread. Whole grain crackers. Low-sodium crackers.  Vegetables Fresh or frozen vegetables (raw, steamed, roasted, or grilled). Low-sodium or reduced-sodium tomato and vegetable juices. Low-sodium or reduced-sodium tomato sauce and paste. Low-sodium or reduced-sodium canned vegetables.   Fruits All fresh, canned (in natural juice), or frozen fruits.  Protein Products  All fish and seafood.  Dried beans, peas, or lentils. Unsalted nuts and seeds. Unsalted canned beans.  Dairy Low-fat dairy products, such as skim or 1% milk, 2% or reduced-fat cheeses, low-fat ricotta or cottage cheese, or plain low-fat yogurt. Low-sodium or reduced-sodium cheeses.  Fats and Oils Tub margarines without trans fats. Light or reduced-fat mayonnaise and salad dressings (reduced sodium). Avocado. Safflower, olive, or canola oils. Natural peanut or almond butter.  Other Unsalted popcorn and pretzels. The items listed above may not be a complete list of recommended foods or beverages. Contact your dietitian for more options.  ++++++++++++++++++  WHAT FOODS ARE NOT RECOMMENDED? Grains/ White flour or wheat flour White bread. White pasta. White rice. Refined cornbread. Bagels and croissants. Crackers that contain trans fat.  Vegetables  Creamed or fried vegetables. Vegetables in a . Regular canned vegetables. Regular canned tomato sauce and paste. Regular tomato and vegetable juices.  Fruits Dried fruits. Canned fruit in light or heavy syrup. Fruit juice.  Meat and Other Protein Products Meat in general -  RED meat & White meat.  Fatty cuts of meat. Ribs, chicken wings, all processed meats as bacon, sausage, bologna, salami, fatback, hot dogs, bratwurst and packaged luncheon meats.  Dairy Whole or 2% milk, cream, half-and-half, and cream cheese. Whole-fat or sweetened yogurt. Full-fat cheeses or blue cheese. Non-dairy creamers and whipped toppings. Processed cheese, cheese spreads, or cheese curds.  Condiments Onion and garlic salt, seasoned salt, table salt, and sea salt. Canned and packaged gravies. Worcestershire sauce. Tartar sauce. Barbecue sauce. Teriyaki sauce. Soy sauce, including reduced sodium. Steak sauce. Fish sauce. Oyster sauce. Cocktail sauce. Horseradish. Ketchup and mustard. Meat flavorings and tenderizers. Bouillon cubes. Hot sauce. Tabasco sauce. Marinades. Taco seasonings. Relishes.  Fats and Oils Butter, stick margarine, lard, shortening and bacon fat. Coconut, palm kernel,  or palm oils. Regular salad dressings.  Pickles and olives. Salted popcorn and pretzels.  The items listed above may not be a complete list of foods and beverages to avoid.

## 2018-11-26 LAB — URINALYSIS W MICROSCOPIC + REFLEX CULTURE
Bilirubin Urine: NEGATIVE
Glucose, UA: NEGATIVE
Hgb urine dipstick: NEGATIVE
Leukocyte Esterase: NEGATIVE
Nitrites, Initial: NEGATIVE
Protein, ur: NEGATIVE
Specific Gravity, Urine: 1.028 (ref 1.001–1.03)
pH: 5.5 (ref 5.0–8.0)

## 2018-11-26 LAB — CBC WITH DIFFERENTIAL/PLATELET
Absolute Monocytes: 452 cells/uL (ref 200–950)
Basophils Absolute: 29 cells/uL (ref 0–200)
Basophils Relative: 0.5 %
Eosinophils Absolute: 99 cells/uL (ref 15–500)
Eosinophils Relative: 1.7 %
HCT: 41 % (ref 35.0–45.0)
Hemoglobin: 13.7 g/dL (ref 11.7–15.5)
Lymphs Abs: 2917 cells/uL (ref 850–3900)
MCH: 28.8 pg (ref 27.0–33.0)
MCHC: 33.4 g/dL (ref 32.0–36.0)
MCV: 86.3 fL (ref 80.0–100.0)
MPV: 10.8 fL (ref 7.5–12.5)
Monocytes Relative: 7.8 %
Neutro Abs: 2303 cells/uL (ref 1500–7800)
Neutrophils Relative %: 39.7 %
Platelets: 321 10*3/uL (ref 140–400)
RBC: 4.75 10*6/uL (ref 3.80–5.10)
RDW: 13.4 % (ref 11.0–15.0)
Total Lymphocyte: 50.3 %
WBC: 5.8 10*3/uL (ref 3.8–10.8)

## 2018-11-26 LAB — VITAMIN D 25 HYDROXY (VIT D DEFICIENCY, FRACTURES): Vit D, 25-Hydroxy: 32 ng/mL (ref 30–100)

## 2018-11-26 LAB — LIPID PANEL
Cholesterol: 197 mg/dL (ref <200)
HDL: 50 mg/dL (ref >=50)
LDL Cholesterol (Calc): 117 mg/dL (calc) — ABNORMAL HIGH
Non-HDL Cholesterol (Calc): 147 mg/dL (calc) — ABNORMAL HIGH (ref <130)
Total CHOL/HDL Ratio: 3.9 (calc) (ref <5.0)
Triglycerides: 181 mg/dL — ABNORMAL HIGH (ref <150)

## 2018-11-26 LAB — COMPLETE METABOLIC PANEL WITH GFR
AG Ratio: 1.7 (calc) (ref 1.0–2.5)
ALT: 29 U/L (ref 6–29)
AST: 32 U/L (ref 10–35)
Albumin: 4.4 g/dL (ref 3.6–5.1)
Alkaline phosphatase (APISO): 35 U/L — ABNORMAL LOW (ref 37–153)
BUN/Creatinine Ratio: 12 (calc) (ref 6–22)
BUN: 15 mg/dL (ref 7–25)
CO2: 33 mmol/L — ABNORMAL HIGH (ref 20–32)
Calcium: 9.7 mg/dL (ref 8.6–10.4)
Chloride: 103 mmol/L (ref 98–110)
Creat: 1.23 mg/dL — ABNORMAL HIGH (ref 0.50–1.05)
GFR, Est African American: 57 mL/min/{1.73_m2} — ABNORMAL LOW (ref >=60)
GFR, Est Non African American: 49 mL/min/{1.73_m2} — ABNORMAL LOW (ref >=60)
Globulin: 2.6 g/dL (calc) (ref 1.9–3.7)
Glucose, Bld: 102 mg/dL — ABNORMAL HIGH (ref 65–99)
Potassium: 3.8 mmol/L (ref 3.5–5.3)
Sodium: 145 mmol/L (ref 135–146)
Total Bilirubin: 0.4 mg/dL (ref 0.2–1.2)
Total Protein: 7 g/dL (ref 6.1–8.1)

## 2018-11-26 LAB — NO CULTURE INDICATED

## 2018-11-26 LAB — TSH: TSH: 1.82 mIU/L

## 2018-11-26 LAB — HEMOGLOBIN A1C
Hgb A1c MFr Bld: 6.6 % of total Hgb — ABNORMAL HIGH (ref <5.7)
Mean Plasma Glucose: 143 (calc)
eAG (mmol/L): 7.9 (calc)

## 2018-11-26 LAB — MAGNESIUM: Magnesium: 2 mg/dL (ref 1.5–2.5)

## 2018-11-26 LAB — INSULIN, RANDOM: Insulin: 36.4 u[IU]/mL — ABNORMAL HIGH

## 2018-11-26 LAB — VITAMIN B12: Vitamin B-12: 504 pg/mL (ref 200–1100)

## 2018-11-28 NOTE — Progress Notes (Signed)
Attempted to notify patient x2 via telephone call.

## 2018-12-01 NOTE — Progress Notes (Signed)
Patient notified of all lab results 11/28/18 1730.  All questions answered.  Follow up in three months.

## 2019-01-24 ENCOUNTER — Other Ambulatory Visit: Payer: Self-pay | Admitting: Internal Medicine

## 2019-02-02 ENCOUNTER — Other Ambulatory Visit: Payer: Self-pay | Admitting: Internal Medicine

## 2019-02-02 DIAGNOSIS — E782 Mixed hyperlipidemia: Secondary | ICD-10-CM

## 2019-02-23 NOTE — Progress Notes (Deleted)
3 MONTH FOLLOW UP  Assessment and Plan:  Diagnoses and all orders for this visit:  Essential hypertension - continue medication: Olmesartan/HCTA 40/25mg  daily, DASH diet,  Discussed dietary and exercise modifications monitor at home.  Call if greater than 140/80   Mixed hyperlipidemia Cholesterol Continue medications: Crestor 40mg  Continue low cholesterol diet and exercise.  Check lipid panel.   CKD (chronic kidney disease) stage 2, GFR 60-89 ml/min Cholesterol Increase fluids  Avoid NSAIDS Blood pressure control Monitor sugars  Will continue to monitor  Vitamin D deficiency Continue supplementation Vitamin D50,000IU three days a week Check vitamin D level  Environmental allergies Doing well at this time Uses flonase PRN  Morbid obesity (HCC) BMI 50.0-59.0 Abnormal glucose Discussed dietary and exercise modifications Encouraged healthy behaviors and encouraged increase walking Encouraged including spouse  Medication management Continued   Discussed med's effects and SE's. Screening labs and tests as requested with regular follow-up as recommended. Over 30 minutes of face to face interview, exam, counseling, chart review, and complex, high level critical decision making was performed this visit.    Future Appointments  Date Time Provider [place]  02/24/2019 11:30 AM Garnet Sierras, NP [facility] None  12/01/2019  3:00 PM Fernand Sorbello, Danton Sewer, NP [facility] None    ----------------------------------------------------------------------------------------------------------------------  HPI 58 y.o. female  presents for 3 month follow up on HTN, HLD, abnormal glucose with history of pre-diabetes, weight and vitamin D deficiency.   BMI is There is no height or weight on file to calculate BMI., she {HAS HAS [id number] been working on diet and exercise. Wt Readings from Last 3 Encounters:  11/25/18 (!) 334 lb (151.5 kg)  02/18/18 (!) 330 lb (149.7 kg)   11/14/17 (!) 330 lb (149.7 kg)     HTN predates  Her blood pressure {HAS HAS NOT:18834} been controlled at home, today their BP is    She {DOES_DOES [id number] workout. She denies any cardiac symptoms, chest pains, palpitations, shortness of breath, dizziness or lower extremity edema.     She {ACTION; IS/IS [id number] on cholesterol medication {Cholesterol meds:21887} and denies myalgias.   Her cholesterol {ACTION; IS/IS NOT:[id number]} at goal. The cholesterol last visit was:   Lab Results  Component Value Date   CHOL 197 11/25/2018   HDL 50 11/25/2018   LDLCALC 117 (H) 11/25/2018   TRIG 181 (H) 11/25/2018   CHOLHDL 3.9 11/25/2018    She {Has/has not:18111} been working on diet and exercise for prediabetes, and denies {Symptoms; diabetes w/o none:19199}. Last A1C in the office was:  Lab Results  Component Value Date   HGBA1C 6.6 (H) 11/25/2018   Patient is on Vitamin D supplement.   Lab Results  Component Value Date   VD25OH 32 11/25/2018       Current Medications:  Current Outpatient Medications on File Prior to Visit  Medication Sig  . cetirizine (ZYRTEC) 10 MG tablet Take 10 mg by mouth daily as needed for allergies.   . fluticasone (FLONASE) 50 MCG/ACT nasal spray Place 2 sprays into both nostrils at bedtime. (Patient taking differently: Place 2 sprays into both nostrils as needed. )  . olmesartan-hydrochlorothiazide (BENICAR HCT) 40-25 MG tablet Take 1 tablet Daily for BP, Fluid Retention & Ankle Swelling  . OVER THE COUNTER MEDICATION OTC acid reducer  . rosuvastatin (CRESTOR) 40 MG tablet Take 1 tablet (40 mg total) by mouth daily.  . Vitamin D, Ergocalciferol, (DRISDOL) 1.25 MG (50000 UT) CAPS capsule TAKE 1 CAPSULE (50,000 UNITS TOTAL) BY MOUTH 3 (THREE) TIMES A WEEK.  No current facility-administered medications on file prior to visit.    Allergies:  No Known Allergies   Medical History:  Past Medical History:  Diagnosis Date  . Allergy   .  Hyperlipidemia   . Hypertension   . Obesity   . Type II or unspecified type diabetes mellitus without mention of complication, not stated as uncontrolled     Family history- Reviewed and unchanged   Social history- Reviewed and unchanged   Names of Other Physician/Practitioners you currently use: 1. [facility] here for primary care 2. Eye Exam: *** 3. Dental Exam ***   Patient Care Team: Unk Pinto, MD as PCP - General (Internal Medicine) Molli Posey, MD as Consulting Physician (Obstetrics and Gynecology)   Screening Tests: Immunization History  Administered Date(s) Administered  . Influenza Inj Mdck Quad With Preservative 01/11/2019  . Influenza,inj,Quad PF,6+ Mos 02/26/2018  . Influenza,inj,quad, With Preservative 12/26/2015  . Influenza-Unspecified 01/28/2014  . PPD Test 08/19/2013  . Td 09/06/2003  . Tdap 08/19/2013     Vaccinations: TD or Tdap: 2015 Influenza: DUE? Pneumococcal: N/A Prevnar13: N/A Shingles: Zostavax/Shingrix: Discussed with patient   Preventative Care: Last colonoscopy: 2015 Last mammogram: 04/2018 [facility]  Last pap smear/pelvic exam: 04/2018  DEXA:N/A Hep C screening [id number]): DUE HIV: DUE    Review of Systems:  ROS    Physical Exam: There were no vitals taken for this visit. Wt Readings from Last 3 Encounters:  11/25/18 (!) 334 lb (151.5 kg)  02/18/18 (!) 330 lb (149.7 kg)  11/14/17 (!) 330 lb (149.7 kg)   General Appearance: Well nourished, in no apparent distress. Eyes: PERRLA, EOMs, conjunctiva no swelling or erythema Sinuses: No Frontal/maxillary tenderness ENT/Mouth: Ext aud canals clear, TMs without erythema, bulging. No erythema, swelling, or exudate on post pharynx.  Tonsils not swollen or erythematous. Hearing normal.  Neck: Supple, thyroid normal.  Respiratory: Respiratory effort normal, BS equal bilaterally without rales, rhonchi, wheezing or  stridor.  Cardio: RRR with no MRGs. Brisk peripheral pulses without edema.  Abdomen: Soft, + BS.  Non tender, no guarding, rebound, hernias, masses. Lymphatics: Non tender without lymphadenopathy.  Musculoskeletal: Full ROM, 5/5 strength, {PSY - GAIT AND STATION:22860} gait Skin: Warm, dry without rashes, lesions, ecchymosis.  Neuro: Cranial nerves intact. No cerebellar symptoms.  Psych: Awake and oriented X 3, normal affect, Insight and Judgment appropriate.    Garnet Sierras, NP [facility] 10:24 AM

## 2019-02-24 ENCOUNTER — Other Ambulatory Visit: Payer: Self-pay

## 2019-02-24 ENCOUNTER — Ambulatory Visit: Payer: 59 | Admitting: Adult Health Nurse Practitioner

## 2019-02-24 ENCOUNTER — Ambulatory Visit (INDEPENDENT_AMBULATORY_CARE_PROVIDER_SITE_OTHER): Payer: No Typology Code available for payment source | Admitting: Adult Health Nurse Practitioner

## 2019-02-24 ENCOUNTER — Encounter: Payer: Self-pay | Admitting: Adult Health Nurse Practitioner

## 2019-02-24 VITALS — BP 132/78 | HR 68 | Temp 97.5°F | Wt 336.0 lb

## 2019-02-24 DIAGNOSIS — I10 Essential (primary) hypertension: Secondary | ICD-10-CM | POA: Diagnosis not present

## 2019-02-24 DIAGNOSIS — R7309 Other abnormal glucose: Secondary | ICD-10-CM

## 2019-02-24 DIAGNOSIS — G2581 Restless legs syndrome: Secondary | ICD-10-CM

## 2019-02-24 DIAGNOSIS — N182 Chronic kidney disease, stage 2 (mild): Secondary | ICD-10-CM | POA: Diagnosis not present

## 2019-02-24 DIAGNOSIS — E559 Vitamin D deficiency, unspecified: Secondary | ICD-10-CM | POA: Diagnosis not present

## 2019-02-24 DIAGNOSIS — E782 Mixed hyperlipidemia: Secondary | ICD-10-CM

## 2019-02-24 MED ORDER — CHOLECALCIFEROL 1.25 MG (50000 UT) PO CAPS: ORAL_CAPSULE | ORAL | 0 refills | Status: DC

## 2019-02-24 MED ORDER — ROPINIROLE HCL 0.25 MG PO TABS: ORAL_TABLET | ORAL | 2 refills | Status: DC

## 2019-02-24 NOTE — Progress Notes (Signed)
3 MONTH FOLLOW UP  Assessment and Plan:  Lorine was seen today for follow-up.  Diagnoses and all orders for this visit:   Essential hypertension - continue medication: Olmesartan/HCTA 40/25mg  daily, DASH diet,  Discussed dietary and exercise modifications monitor at home.  Call if greater than 140/80   Mixed hyperlipidemia Cholesterol Continue medications: Crestor 40mg  Continue low cholesterol diet and exercise.  Check lipid panel.   CKD (chronic kidney disease) stage 2, GFR 60-89 ml/min Cholesterol Increase fluids  Avoid NSAIDS Blood pressure control Monitor sugars  Will continue to monitor  Vitamin D deficiency Continue supplementation Vitamin D50,000IU three days a week Check vitamin D level  Environmental allergies Doing well at this time Uses flonase PRN  Morbid obesity (HCC) BMI 50.0-59.0 Abnormal glucose Discussed dietary and exercise modifications Encouraged healthy behaviors and encouraged increase walking Encouraged including spouse  Medication management Continued  Restless leg syndrome -     rOPINIRole (REQUIP) 0.25 MG tablet; Take one tablet by mouth nightly, 37min-one hour prior to bed.    Discussed med's effects and SE's. Screening labs and tests as requested with regular follow-up as recommended. Over 30 minutes of interview, exam, counseling, chart review, and complex, high level critical decision making was performed this visit.   HPI  58 y.o. female  presents for a complete physical and follow up for has Prediabetes; Hypertension; Hyperlipidemia; Environmental allergies; Morbid obesity ([place]); History of colon polyps; Medication management; Vitamin D deficiency; and CKD (chronic kidney disease) stage 2, GFR 60-89 ml/min on their problem list..  Her blood pressure has been controlled at home, today their BP is BP: 132/78 She does workout. She denies chest pain, shortness of breath, dizziness.   She is on cholesterol medication and denies  myalgias. Her cholesterol is not at goal. The cholesterol last visit was:   Lab Results  Component Value Date   CHOL 219 (H) 02/18/2018   HDL 56 02/18/2018   LDLCALC 139 (H) 02/18/2018   TRIG 119 02/18/2018   CHOLHDL 3.9 02/18/2018    She has been working on diet and exercise for prediabetes, she is not on bASA, she is on ACE/ARB and denies nausea, paresthesia of the feet, polydipsia, polyuria, visual disturbances, vomiting and weight loss. Last A1C in the office was:  Lab Results  Component Value Date   HGBA1C 6.4 (H) 02/18/2018    Last GFR: Lab Results  Component Value Date   GFRNONAA 81 02/18/2018   Lab Results  Component Value Date   GFRAA 93 02/18/2018    Patient is on Vitamin D supplement.   Lab Results  Component Value Date   VD25OH 25 (L) 02/18/2018      Current Medications:  Current Outpatient Medications on File Prior to Visit  Medication Sig Dispense Refill  . cetirizine (ZYRTEC) 10 MG tablet Take 10 mg by mouth daily as needed for allergies.     . fluticasone (FLONASE) 50 MCG/ACT nasal spray Place 2 sprays into both nostrils at bedtime. (Patient taking differently: Place 2 sprays into both nostrils as needed. ) 16 g 1  . olmesartan-hydrochlorothiazide (BENICAR HCT) 40-25 MG tablet Take 1 tablet Daily for BP, Fluid Retention & Ankle Swelling 90 tablet 0  . OVER THE COUNTER MEDICATION OTC acid reducer    . rosuvastatin (CRESTOR) 40 MG tablet Take 1 tablet (40 mg total) by mouth daily. 90 tablet 1  . Vitamin D, Ergocalciferol, (DRISDOL) 1.25 MG (50000 UT) CAPS capsule TAKE 1 CAPSULE (50,000 UNITS TOTAL) BY MOUTH 3 (THREE)  TIMES A WEEK. 36 capsule 19   No current facility-administered medications on file prior to visit.   Allergies:  No Known Allergies Medical History:  She has Prediabetes; Hypertension; Hyperlipidemia; Environmental allergies; Morbid obesity ([place]); History of colon polyps; Medication management; Vitamin D deficiency; and CKD (chronic kidney  disease) stage 2, GFR 60-89 ml/min on their problem list. Health Maintenance:   Immunization History  Administered Date(s) Administered  . Influenza Inj Mdck Quad With Preservative 01/11/2019  . Influenza,inj,Quad PF,6+ Mos 02/26/2018  . Influenza,inj,quad, With Preservative 12/26/2015  . Influenza-Unspecified 01/28/2014  . PPD Test 08/19/2013  . Td 09/06/2003  . Tdap 08/19/2013    Tetanus: 2015 Pneumovax: N/A Prevnar 13: N/A Flu vaccine: 01/2019 Zostavax: Discussed with patient LMP: postmenopausal Pap: 04/2018 MGM: 04/2018 - [facility] DEXA: N/A Colonoscopy: 2015, due 2025   Last Dental Exam: Dr Gerlene Burdock 2020  Last Eye Exam: 02/2018, due for 2020  Patient Care Team: Unk Pinto, MD as PCP - General (Internal Medicine) Molli Posey, MD as Consulting Physician (Obstetrics and Gynecology)  Surgical History:  She has a past surgical history that includes Cesarean section; Umbilical hernia repair; Tubal ligation; and Colonoscopy with propofol (N/A, 12/15/2013). Family History:  Herfamily history includes Alcohol abuse in her mother; Cancer in her father; Cancer (age of onset: 60) in her sister; Diabetes in her paternal grandmother and son; Hypertension in her mother. Social History:  She reports that she has never smoked. She has never used smokeless tobacco. She reports that she does not drink alcohol or use drugs.  Review of Systems: ROS  Physical Exam: Estimated body mass index is 55.06 kg/m as calculated from the following:   Height as of 11/25/18: 5' 5.5" (1.664 m).   Weight as of this encounter: 336 lb (152.4 kg). BP 132/78   Pulse 68   Temp (!) 97.5 F (36.4 C)   Wt (!) 336 lb (152.4 kg)   LMP 06/11/2014   SpO2 97%   BMI 55.06 kg/m    General Appearance: Well nourished, in no apparent distress.  Eyes: PERRLA, EOMs, conjunctiva no swelling or erythema, normal fundi and vessels.  ENT/Mouth: Ext aud canals clear, normal light reflex with TMs  without erythema, bulging. Wearing mask.  Hearing normal.  Neck: Supple, thyroid normal. No bruits  Respiratory: Respiratory effort normal, BS equal bilaterally without rales, rhonchi, wheezing or stridor.  Cardio: RRR without murmurs, rubs or gallops. Brisk peripheral pulses without edema.  Chest: symmetric, with normal excursions and percussion.  Breasts: Symmetric, without lumps, nipple discharge, retractions.  Abdomen: Soft obese, nontender, no guarding, rebound, hernias, masses, or organomegaly.  Genitourinary: defer Musculoskeletal: Full ROM all peripheral extremities,5/5 strength, and normal gait.  Skin: Warm, dry without rashes, lesions, ecchymosis. Neuro: Cranial nerves intact, reflexes equal bilaterally. Normal muscle tone, no cerebellar symptoms. Sensation intact.  Psych: Awake and oriented X 3, normal affect, Insight and Judgment appropriate.     Garnet Sierras 4:33 PM [facility]

## 2019-02-24 NOTE — Patient Instructions (Addendum)
Consider changing the timing of your blood pressure medication to early in the morning when you wake up.  You can take it with part of a bagel or few crackers, yogurt. This could decrease the number of times you wake up during the night to go to the bathroom.   We will send in Requip 0.25mg  for you to take 52min to one hour prior to going to bed.  This may help with your legs.  There is some general information below.  I have started you on a low dose.   Get back to walking with your granddaughter.    8 Critical Weight-Loss Tips That Aren't Diet and Exercise  1. STARVE THE DISTRACTIONS  All too often when we eat, we're also multitasking: watching TV, answering emails, scrolling through social media. These habits are detrimental to having a strong, clear, healthy relationship with food, and they can hinder our ability to make dietary changes.  In order to truly focus on what you're eating, how much you're eating, why you're eating those specific foods and, most importantly, how those foods make you feel, you need to starve the distractions. That means when you eat, just eat. Focus on your food, the process it went through to end up on your plate, where it came from and how it nourishes you. With this technique, you're more likely to finish a meal feeling satiated.  2.  CONSIDER WHAT YOU'RE NOT WILLING TO DO  This might sound counterintuitive, but it can help provide a "why" when motivation is waning. Declare, in writing, what you are unwilling to do, for example "I am unwilling to be the old dad who cannot play sports with my children".  So consider what you're not willing to accept, write it down, and keep it at the ready.  3.  STOP LABELING FOOD "GOOD" AND "BAD"  You've probably heard someone say they ate something "bad." Maybe you've even said it yourself.  The trouble with 'bad' foods isn't that they'll send you to the grave after a bite or two. The trouble comes when we eat excessive  portions of really calorie-dense foods meal after meal, day after day.  Instead of labeling foods as good or bad, think about which foods you can eat a lot of, and which ones you should just eat a little of. Then, plan ways to eat the foods you really like in portions that fit with your overall goals. A good example of this would be having a slice of pizza alongside a club salad with chicken breast, avocado and a bit of dressing. This is vastly different than 3 slices of pizza, 4 breadsticks with cheese sauce and half of a liter of regular soda.  4.  BRUSH YOUR TEETH AFTER YOU EAT  Getting your mindset in order is important, but sometimes small habits can make a big difference. After eating, you still have the taste of food in their mouth, which often causes people to eat more even if they are full or engage in a nibble or two of dessert.  Brushing your teeth will remove the taste of food from your mouth, and the clean, minty freshness will serve as a cue that mealtime is over.  5.  FOCUS ON CROWDING NOT CUTTING  The most common first step during 'dieting' is to cut. We cut our portion sizes down, we cut out 'bad' foods, we cut out entire food groups. This act of cutting puts [place] and our minds into scarcity mode.  When something is off-limits, even if you're able to avoid it for a while, you could end up bingeing on it later because you've gone so long without it. So, instead of cutting, focus on crowding. If you crowd your plate and fill it up with more foods like veggies and protein, it simply allows less room for the other stuff. In other words, shift your focus away from what you can't eat, and celebrate the foods that will help you reach your goals.  6.  TAKE TRACKING A STEP FURTHER  Track what you eat, when you ate it, how much you ate and how that food made you feel. Being completely honest with yourself and writing down every single thing that passes through your lips will help you start to  notice that maybe you actually do snack, possibly take in more sugar than you thought, eat when you're bored rather than just hungry or maybe that you have a habit of snacking before bed while watching TV.  The difference from simply tracking your food intake is you're taking into account how food makes you feel, as well as what you're doing while you're eating. This is about becoming more mindful of what, when and why you eat.  7.  PRIORITIZE GOOD SLEEP  One of the strongest risk factors for being overweight is poor sleep. When you're feeling tired, you're more likely to choose unhealthy comfort foods and to skip your workout. Additionally, sleep deprivation may slow down your metabolism. [employer]! Therefore, sleeping 7-8 hours per night can help with weight loss without having to change your diet or increase your physical activity. And if you feel you snore and still wake up tired, talk with me about sleep apnea.  8.  SET ASIDE TIME TO DISCONNECT  Just get out there. Disconnect from the electronics and connect to the elements. Not only will this help reduce stress (a major factor in weight gain) by giving your mind a break from the constant stimulation we've all become so accustomed to, but it may also reprogram your brain to connect with yourself and what you're feeling.    Ropinirole tablets What is this medicine? ROPINIROLE (roe PIN i role) is used to treat the symptoms of Parkinson's disease. It helps to improve muscle control and movement difficulties. It is also used for the treatment of Restless Legs Syndrome. This medicine may be used for other purposes; ask your health care provider or pharmacist if you have questions. COMMON BRAND NAME(S): Requip What should I tell my health care provider before I take this medicine? They need to know if you have any of these conditions:  dizzy or fainting spells  heart disease  high blood pressure  kidney disease  liver disease  low blood  pressure  sleeping problems  an unusual or allergic reaction to ropinirole, other medicines, foods, dyes, or preservatives  pregnant or trying to get pregnant  breast-feeding How should I use this medicine? Take this medicine by mouth with a glass of water. Follow the directions on the prescription label. You can take it with or without food. If it upsets your stomach, take it with food. Take your doses at regular intervals. Do not take your medicine more often than directed. Do not stop taking this medicine except on your doctor's advice. Stopping this medicine too quickly may cause serious side effects. Talk to your pediatrician regarding the use of this medicine in children. Special care may be needed. Overdosage: If you think you have  taken too much of this medicine contact a poison control center or emergency room at once. NOTE: This medicine is only for you. Do not share this medicine with others. What if I miss a dose? If you miss a dose, take it as soon as you can. If it is almost time for your next dose, take only that dose. Do not take double or extra doses. What may interact with this medicine?  ciprofloxacin  female hormones, like estrogens and birth control pills  medicines for depression, anxiety, or psychotic disturbances  metoclopramide  mexiletine  norfloxacin  omeprazole This list may not describe all possible interactions. Give your health care provider a list of all the medicines, herbs, non-prescription drugs, or dietary supplements you use. Also tell them if you smoke, drink alcohol, or use illegal drugs. Some items may interact with your medicine. What should I watch for while using this medicine? Visit your doctor or health care professional for regular checks on your progress. It may be several weeks or months before you feel the full effect of this medicine. You may get drowsy or dizzy. Do not drive, use machinery, or do anything that needs mental alertness  until you know how this drug affects you. Do not stand or sit up quickly, especially if you are an older patient. This reduces the risk of dizzy or fainting spells. Alcohol can increase possible dizziness. Avoid alcoholic drinks. If you find that you have sudden feelings of wanting to sleep during normal activities, like cooking, watching television, or while driving or riding in a car, you should contact your health care professional. Your mouth may get dry. Chewing sugarless gum or sucking hard candy, and drinking plenty of water may help. Contact your doctor if the problem does not go away or is severe. There have been reports of increased sexual urges or other strong urges such as gambling while taking some medicines for Parkinson's disease. If you experience any of these urges while taking this medicine, you should report it to your health care provider as soon as possible. You should check your skin often for changes to moles and new growths while taking this medicine. Call your doctor if you notice any of these changes. What side effects may I notice from receiving this medicine? Side effects that you should report to your doctor or health care professional as soon as possible:  allergic reactions like skin rash, itching or hives, swelling of the face, lips, or tongue  changes in vision  chest pain  confusion  falling asleep during normal activities like driving  fast, irregular heartbeat  feeling faint or lightheaded, falls  hallucination, loss of contact with reality  joint or muscle pain  loss of bladder control  loss of memory  new or increased gambling urges, sexual urges, uncontrolled spending, binge or compulsive eating, or other urges  pain, tingling, numbness in the hands or feet  shortness of breath, troubled breathing, tightness in chest, or wheezing  signs and symptoms of low blood pressure like dizziness; feeling faint or lightheaded, falls; unusually weak or  tired  swelling of the ankles, feet, hands  uncontrollable head, mouth, neck, arm, or leg movements  vomiting Side effects that usually do not require medical attention (report to your doctor or health care professional if they continue or are bothersome):  dizziness  drowsiness  headache  increased sweating  nausea  tremors This list may not describe all possible side effects. Call your doctor for medical advice about  side effects. You may report side effects to FDA at 1-800-FDA-1088. Where should I keep my medicine? Keep out of the reach of children. Store at room temperature between 20 and 25 degrees C (68 and 77 degrees F). Protect from light and moisture. Keep container tightly closed. Throw away any unused medicine after the expiration date. NOTE: This sheet is a summary. It may not cover all possible information. If you have questions about this medicine, talk to your doctor, pharmacist, or health care provider.  2020 Elsevier/Gold Standard (2015-08-15 10:58:05)

## 2019-02-25 LAB — LIPID PANEL
Cholesterol: 208 mg/dL — ABNORMAL HIGH (ref <200)
HDL: 52 mg/dL (ref >=50)
LDL Cholesterol (Calc): 127 mg/dL (calc) — ABNORMAL HIGH
Non-HDL Cholesterol (Calc): 156 mg/dL (calc) — ABNORMAL HIGH (ref <130)
Total CHOL/HDL Ratio: 4 (calc) (ref <5.0)
Triglycerides: 171 mg/dL — ABNORMAL HIGH (ref <150)

## 2019-02-25 LAB — CBC WITH DIFFERENTIAL/PLATELET
Absolute Monocytes: 564 cells/uL (ref 200–950)
Basophils Absolute: 12 cells/uL (ref 0–200)
Basophils Relative: 0.2 %
Eosinophils Absolute: 108 cells/uL (ref 15–500)
Eosinophils Relative: 1.8 %
HCT: 40.7 % (ref 35.0–45.0)
Hemoglobin: 13.5 g/dL (ref 11.7–15.5)
Lymphs Abs: 2946 cells/uL (ref 850–3900)
MCH: 28.2 pg (ref 27.0–33.0)
MCHC: 33.2 g/dL (ref 32.0–36.0)
MCV: 85.1 fL (ref 80.0–100.0)
MPV: 10.7 fL (ref 7.5–12.5)
Monocytes Relative: 9.4 %
Neutro Abs: 2370 cells/uL (ref 1500–7800)
Neutrophils Relative %: 39.5 %
Platelets: 300 10*3/uL (ref 140–400)
RBC: 4.78 10*6/uL (ref 3.80–5.10)
RDW: 12.9 % (ref 11.0–15.0)
Total Lymphocyte: 49.1 %
WBC: 6 10*3/uL (ref 3.8–10.8)

## 2019-02-25 LAB — HEMOGLOBIN A1C
Hgb A1c MFr Bld: 6.6 % of total Hgb — ABNORMAL HIGH (ref <5.7)
Mean Plasma Glucose: 143 (calc)
eAG (mmol/L): 7.9 (calc)

## 2019-02-25 LAB — MAGNESIUM: Magnesium: 1.9 mg/dL (ref 1.5–2.5)

## 2019-02-25 LAB — COMPLETE METABOLIC PANEL WITH GFR
AG Ratio: 1.7 (calc) (ref 1.0–2.5)
ALT: 26 U/L (ref 6–29)
AST: 28 U/L (ref 10–35)
Albumin: 4.3 g/dL (ref 3.6–5.1)
Alkaline phosphatase (APISO): 39 U/L (ref 37–153)
BUN: 16 mg/dL (ref 7–25)
CO2: 30 mmol/L (ref 20–32)
Calcium: 9.7 mg/dL (ref 8.6–10.4)
Chloride: 102 mmol/L (ref 98–110)
Creat: 1.01 mg/dL (ref 0.50–1.05)
GFR, Est African American: 72 mL/min/{1.73_m2} (ref >=60)
GFR, Est Non African American: 62 mL/min/{1.73_m2} (ref >=60)
Globulin: 2.6 g/dL (calc) (ref 1.9–3.7)
Glucose, Bld: 100 mg/dL — ABNORMAL HIGH (ref 65–99)
Potassium: 3.8 mmol/L (ref 3.5–5.3)
Sodium: 143 mmol/L (ref 135–146)
Total Bilirubin: 0.4 mg/dL (ref 0.2–1.2)
Total Protein: 6.9 g/dL (ref 6.1–8.1)

## 2019-02-25 LAB — TSH: TSH: 3.76 m[IU]/L (ref 0.40–4.50)

## 2019-02-25 LAB — VITAMIN D 25 HYDROXY (VIT D DEFICIENCY, FRACTURES): Vit D, 25-Hydroxy: 27 ng/mL — ABNORMAL LOW (ref 30–100)

## 2019-04-25 ENCOUNTER — Other Ambulatory Visit: Payer: Self-pay | Admitting: Internal Medicine

## 2019-05-13 ENCOUNTER — Ambulatory Visit: Payer: 59 | Admitting: Physician Assistant

## 2019-05-13 ENCOUNTER — Other Ambulatory Visit: Payer: Self-pay

## 2019-05-13 DIAGNOSIS — R05 Cough: Secondary | ICD-10-CM

## 2019-05-13 DIAGNOSIS — R059 Cough, unspecified: Secondary | ICD-10-CM

## 2019-05-13 MED ORDER — FLUTICASONE PROPIONATE 50 MCG/ACT NA SUSP: 2.0000 | Freq: Every day | NASAL | 1 refills | Status: DC

## 2019-05-13 NOTE — Patient Instructions (Addendum)
Follow up if you get any new symptoms or if you are not improving after a week  Generally a cough is either coming from above or from below- so we will treat this OR it can be from irritation/viral cough  To treat the nasal drip:  Can do a steroid nasal spary LIKE THE FLONASE I SENT IN 1-2 sparys at night each nostril. Remember to spray each nostril twice towards the outer part of your eye.  Do not sniff but instead pinch your nose and tilt your head back to help the medicine get into your sinuses.  The best time to do this is at bedtime. Stop if you get blurred vision or nose bleeds.   To treat the reflux Will send in prilosec 40 mg to take once in the morning and take prevacid from over the counter at night for 2 weeks- then stop the prilosec and continue the pravacid or famotadine  To stop irritation: Need to STOP the cough Do sugar free candy Do the tessalon drops VOICE REST is VERY important  If not better in 2 weeks will refer to ENT  Go to the ER or call the office if you get any chest pain, shortness of breath, severe  headache, leg swelling.    Common causes of cough OR hoarseness OR sore throat:   Allergies, Viral Infections, Acid Reflux and Bacterial Infections.    Allergies and viral infections cause a cough OR sore throat by post nasal drip and are often worse at night, can also have sneezing, lower grade fevers, clear/yellow mucus. This is best treated with allergy medications or nasal sprays.  Please get on allegra for 1-2 weeks The strongest is allegra or fexafinadine  Cheapest at [employer], [employer], [employer]   Bacterial infections are more severe than allergies or viral infections with fever, teeth pain, fatigue. This can be treated with prednisone and the same over the counter medication and after 7 days can be treated with an antibiotic.   Silent reflux/GERD can cause a cough OR sore throat OR hoarseness WITHOUT heart burn because the esophagus that goes to the stomach  and trachea that goes to the lungs are very close and when you lay down the acid can irritate your throat and lungs. This can cause hoarseness, cough, and wheezing. Please stop any alcohol or anti-inflammatories like aleve/advil/ibuprofen and start an over the counter Prilosec or omeprazole 1-2 times daily 55mins before food for 2 weeks, then switch to over the counter zantac/ratinidine or pepcid/famotadine once at night for 2 weeks.    sometimes irritation causes more irritation. Try voice rest, use sugar free cough drops to prevent coughing, and try to stop clearing your throat.   If you ever have a cough that does not go away after trying these things please make a follow up visit for further evaluation or we can refer you to a specialist. Or if you ever have shortness of breath or chest pain go to the ER.    Silent reflux: Not all heartburn burns...Marland KitchenMarland KitchenMarland Kitchen  What is LPR? Laryngopharyngeal reflux (LPR) or silent reflux is a condition in which acid that is made in the stomach travels up the esophagus (swallowing tube) and gets to the throat. Not everyone with reflux has a lot of heartburn or indigestion. In fact, many people with LPR never have heartburn. This is why LPR is called SILENT REFLUX, and the terms "Silent reflux" and "LPR" are often used interchangeably. Because LPR is silent, it is sometimes difficult  to diagnose.  How can you tell if you have LPR?  Marland Kitchen Chronic hoarseness- Some people have hoarseness that comes and goes . throat clearing  . Cough . It can cause shortness of breath and cause asthma like symptoms. Marland Kitchen a feeling of a lump in the throat  . difficulty swallowing . a problem with too much nose and throat drainage.  . Some people will feel their esophagus spasm which feels like their heart beating hard and fast, this will usually be after a meal, at rest, or lying down at night.    How do I treat this? Treatment for LPR should be individualized, and your doctor will suggest the  best treatment for you. Generally there are several treatments for LPR: . changing habits and diet to reduce reflux,  . medications to reduce stomach acid, and  . surgery to prevent reflux. Most people with LPR need to modify how and when they eat, as well as take some medication, to get well. Sometimes, nonprescription liquid antacids, such as Maalox, Gelucil and Mylanta are recommended. When used, these antacids should be taken four times each day - one tablespoon one hour after each meal and before bedtime. Dietary and lifestyle changes alone are not often enough to control LPR - medications that reduce stomach acid are also usually needed. These must be prescribed by our doctor.   TIPS FOR REDUCING REFLUX AND LPR Control your LIFE-STYLE and your DIET! Marland Kitchen If you use tobacco, QUIT.  Marland Kitchen Smoking makes you reflux. After every cigarette you have some LPR.  . Don't wear clothing that is too tight, especially around the waist (trousers, corsets, belts).  . Do not lie down just after eating...in fact, do not eat within three hours of bedtime.  . You should be on a low-fat diet.  . Limit your intake of red meat.  . Limit your intake of butter.  Marland Kitchen Avoid fried foods.  . Avoid chocolate  . Avoid cheese.  Marland Kitchen Avoid eggs. Marland Kitchen Specifically avoid caffeine (especially coffee and tea), soda pop (especially cola) and mints.  . Avoid alcoholic beverages, particularly in the evening.  [facility] is now requiring appointments for testing, you should be able to request an appointment by going to [email or web address] or by texting "COVID" to [phone].  You can also call [phone] for more information about community testing.   There are also several urgent cares that are testing, the PCR send out is a better test than the rapid test.  The pt's will remain in the car and wear a mask when going for testing.The staff will come to the car to perform testing. The pt's will need to bring an ID. People are  getting results in 24-72 hours.  During this time please quarantine you and your family.   [facility] [facility] ([place])  [facility]    To stop home isolation IF you test positive, you will need all three of the statements below.  1) You need to be fever free for 3 days WITHOUT tylenol.  2) Your symptoms such as cough, shortness of breath, diarrhea, etc need to improve.  3) It needs to be at least 10 days since your symptoms first appeared    After you stop home isolation, please continue to wear a mask in public and continue hand washing and contact precautions.   Things to do if you test positive: Please do vitamin  C 1000mg  twice a day- stop if you have any worsening heart burn or diarrhea.   You can take tylenol for fevers above 101 if you feel uncomfortable. Remember a fever itself is not bad, the tylenol is more for your comfort. See the max of tylenol below.  Please do breathing exercises. Make sure that you are taking deep breaths and trying to walk around the room as much as possible.  Please lay "prone" or on your belly for up to 4 hours a day, you can split this up to 30 minute or 1 hour increments but this helps get oxygen to certain places of your lungs.  Please get on 81 mg of aspirin unless you are unable to tolerate this or have a contraindication.  Please pump your feet like your are pedaling a bike to prevent clots in your legs.   Push fluids with Gatorade or electrolyte replacement in addition to water, aim for 100-120 oz a day if no contraindications.  If you have worsening shortness of breath, unable to complete full sentences, are panting, please contact the office immediately for call 911. Let them know you are being monitored for coronavirus.

## 2019-05-13 NOTE — Progress Notes (Signed)
THIS ENCOUNTER IS A VIRTUAL VISIT DUE TO COVID-19 - PATIENT WAS NOT SEEN IN THE OFFICE.  PATIENT HAS CONSENTED TO VIRTUAL VISIT / TELEMEDICINE VISIT  Virtual Visit via telephone Note  I connected with [name] on 05/13/2019 by telephone.  I verified that I am speaking with the correct person using two identifiers.    I discussed the limitations of evaluation and management by telemedicine and the availability of in person appointments. The patient expressed understanding and agreed to proceed.  History of Present Illness: 59 y.o. AAF calls with dry cough x 1 week.  No mucus with cough, no fever, chills, no SOB/CP. Cough can be worse at night but she is able to sleep, not worse after eating.  She has ear popping/clicking, feels "water in her ears". No nasal congestion.  No GERD.  She states had similar thing last year with allergies. She is still doing zyrtec as needed, been taking it at night. She is not on nasal spray.  No exposures to COVID.   Medications   Current Outpatient Medications (Cardiovascular):  .  olmesartan-hydrochlorothiazide (BENICAR HCT) 40-25 MG tablet, Take 1 tablet Daily for BP & Fluid Retention / Ankle Swelling .  rosuvastatin (CRESTOR) 40 MG tablet, Take 1 tablet (40 mg total) by mouth daily.  Current Outpatient Medications (Respiratory):  .  cetirizine (ZYRTEC) 10 MG tablet, Take 10 mg by mouth daily as needed for allergies.  .  fluticasone (FLONASE) 50 MCG/ACT nasal spray, Place 2 sprays into both nostrils at bedtime. (Patient taking differently: Place 2 sprays into both nostrils as needed. ) .  fluticasone (FLONASE) 50 MCG/ACT nasal spray, Place 2 sprays into both nostrils at bedtime.    Current Outpatient Medications (Other):  Marland Kitchen  Cholecalciferol 1.25 MG (50000 UT) capsule, Take one tablet by mouth three days a week for twelve weeks. Marland Kitchen  OVER THE COUNTER MEDICATION, OTC acid reducer .  rOPINIRole (REQUIP) 0.25 MG tablet, Take one tablet by mouth  nightly, 33min-one hour prior to bed. .  Vitamin D, Ergocalciferol, (DRISDOL) 1.25 MG (50000 UT) CAPS capsule, TAKE 1 CAPSULE (50,000 UNITS TOTAL) BY MOUTH 3 (THREE) TIMES A WEEK.  Problem list She has Prediabetes; Hypertension; Hyperlipidemia; Environmental allergies; Morbid obesity ([place]); History of colon polyps; Medication management; Vitamin D deficiency; and CKD (chronic kidney disease) stage 2, GFR 60-89 ml/min on their problem list.   Observations/Objective: General Appearance:Well sounding, in no apparent distress.  ENT/Mouth: No hoarseness, No cough for duration of visit.  Respiratory: completing full sentences without distress, without audible wheeze Neuro: Awake and oriented X 3,  Psych:  Insight and Judgment appropriate.    Assessment and Plan: Diagnoses and all orders for this visit:  Cough Cough- LIKELY nasal drip- get on flonase rest, suppress cough with OTC sugar free candy and RX med.  If any new symptoms call the office or follow up if not better Get COVID test -     fluticasone (FLONASE) 50 MCG/ACT nasal spray; Place 2 sprays into both nostrils at bedtime.     Follow Up Instructions:  I discussed the assessment and treatment plan with the patient. The patient was provided an opportunity to ask questions and all were answered. The patient agreed with the plan and demonstrated an understanding of the instructions.   The patient was advised to call back or seek an in-person evaluation if the symptoms worsen or if the condition fails to improve as anticipated.  I provided 15 minutes of non-face-to-face time during this  encounter.   Vicie Mutters, PA-C

## 2019-05-21 ENCOUNTER — Other Ambulatory Visit: Payer: Self-pay | Admitting: Adult Health Nurse Practitioner

## 2019-05-21 DIAGNOSIS — E559 Vitamin D deficiency, unspecified: Secondary | ICD-10-CM

## 2019-06-04 ENCOUNTER — Other Ambulatory Visit: Payer: Self-pay | Admitting: Physician Assistant

## 2019-06-23 ENCOUNTER — Other Ambulatory Visit: Payer: Self-pay | Admitting: Internal Medicine

## 2019-06-23 DIAGNOSIS — E782 Mixed hyperlipidemia: Secondary | ICD-10-CM

## 2019-07-07 ENCOUNTER — Other Ambulatory Visit: Payer: Self-pay | Admitting: Internal Medicine

## 2019-07-07 DIAGNOSIS — E782 Mixed hyperlipidemia: Secondary | ICD-10-CM

## 2019-07-08 ENCOUNTER — Other Ambulatory Visit: Payer: Self-pay

## 2019-07-08 MED ORDER — ROSUVASTATIN CALCIUM 40 MG PO TABS: 40.0000 mg | ORAL_TABLET | Freq: Every day | ORAL | 1 refills | Status: DC

## 2019-07-08 NOTE — Telephone Encounter (Signed)
Refill on [facility]

## 2019-11-17 ENCOUNTER — Encounter: Payer: 59 | Admitting: Adult Health Nurse Practitioner

## 2019-12-01 ENCOUNTER — Encounter: Payer: 59 | Admitting: Adult Health Nurse Practitioner

## 2019-12-09 NOTE — Progress Notes (Signed)
Complete Physical  Assessment and Plan:  [name] was seen today for annual exam.  Diagnoses and all orders for this visit:  Encounter for routine adult physical exam with abnormal findings Yearly  Essential hypertension Continue current medications: Olmesartan/HCTZ 40mg -25mg  Monitor blood pressure at home; call if consistently over 130/80 Continue DASH diet.   Reminder to go to the ER if any CP, SOB, nausea, dizziness, severe HA, changes vision/speech, left arm numbness and tingling and jaw pain. -     CBC with Differential/Platelet -     COMPLETE METABOLIC PANEL WITH GFR -     TSH -     Magnesium -     EKG 12-Lead -     Microalbumin / creatinine urine ratio  Mixed hyperlipidemia Continue medications:rosuvastatin 40mg  nightly Discussed dietary and exercise modifications Low fat diet -     Lipid panel  CKD (chronic kidney disease) stage 2, GFR 60-89 ml/min Increase fluids  Avoid NSAIDS Blood pressure control Monitor sugars  Will continue to monitor  Abnormal glucose .plankmdee -     Hemoglobin A1c  Environmental allergies Doing well at this tim Uses flonase and cetirizine PRN  Vitamin D deficiency Continue supplementation to maintain goal of 70-100 Taking Vitamin D 50,000 IU twice a week -     VITAMIN D 25 Hydroxy (Vit-D Deficiency, Fractures)  Restless leg syndrome D/C requip, not effective Rx Gabapentin 100mg  nightly Discussed medication and side effects with patient  Morbid obesity (HCC) BMI 50.0-59.9, adult ([place]) Discussed dietary and exercise modifications To start weight loss monitor with our office using phentermine 15mg  EKG - NSR Monthly OV to check progress   Screening for blood or protein in urine -     Urinalysis w microscopic + reflex cultur  Encounter for vitamin deficiency screening -     Vitamin B12 -     Iron,Total/Total Iron Binding Cap  Medication management  Continued  Discussed med's effects and SE's. Screening labs and tests  as requested with regular follow-up as recommended. Over 40 minutes of face to face interview, exam, counseling, chart review, and complex, high level critical decision making was performed this visit.   HPI  59 y.o. female  presents for a complete physical and follow up for has Prediabetes; Hypertension; Hyperlipidemia; Environmental allergies; Morbid obesity ([place]); History of colon polyps; Medication management; Vitamin D deficiency; and CKD (chronic kidney disease) stage 2, GFR 60-89 ml/min on their problem list..  Reports she has ben having bilateral lower extremity pain at night.  Last OV she was prescribed ropiniole 0.25mg .  She reports this did not help with her symptoms.  She describes this an a generalized ache rubbing her upper thigh down past her knee.  She reports that it does wake her up at night.  She wakes in pain and then has to change positions.  She reports she does have this intermittently during the day.  She reports that changing positions, moving around helps it to go away.  It is not constant during the day like it is at night.  She denies any back or hip pains, numbness or tingling, weakness or falls.  She reports she works from home and is sedentary.  She want to start walking and work on improving here health by wight loss.  Drinks 4-5 bottles of water a day.  She does drink soda but has recently cut back to 1-2 a week.  Her weakness is sweets, sh reports she eats a bowl of icecream and cookies very night  while watching tv.  Her blood pressure has been controlled at home, today their BP is BP: 134/85 She does workout. She denies chest pain, shortness of breath, dizziness.   She is on cholesterol medication and denies myalgias. Her cholesterol is not at goal. The cholesterol last visit was:   Lab Results  Component Value Date   CHOL 219 (H) 02/18/2018   HDL 56 02/18/2018   LDLCALC 139 (H) 02/18/2018   TRIG 119 02/18/2018   CHOLHDL 3.9 02/18/2018    She has been working  on diet and exercise for prediabetes, she is not on bASA, she is on ACE/ARB and denies nausea, paresthesia of the feet, polydipsia, polyuria, visual disturbances, vomiting and weight loss. Last A1C in the office was:  Lab Results  Component Value Date   HGBA1C 6.4 (H) 02/18/2018    Last GFR: Lab Results  Component Value Date   GFRNONAA 81 02/18/2018   Lab Results  Component Value Date   GFRAA 93 02/18/2018    Patient is on Vitamin D supplement for deficiency.   Lab Results  Component Value Date   VD25OH 25 (L) 02/18/2018      Current Medications:  Current Outpatient Medications on File Prior to Visit  Medication Sig Dispense Refill  . cetirizine (ZYRTEC) 10 MG tablet Take 10 mg by mouth daily as needed for allergies.     . Cholecalciferol (VITAMIN D3) 1.25 MG (50000 UT) CAPS TAKE ONE TABLET BY MOUTH THREE DAYS A WEEK FOR TWELVE WEEKS. (Patient taking differently: TAKE ONE TABLET BY MOUTH TWO DAYS A WEEK FOR TWELVE WEEKS.) 36 capsule 0  . fluticasone (FLONASE) 50 MCG/ACT nasal spray Place 2 sprays into both nostrils at bedtime. (Patient taking differently: Place 2 sprays into both nostrils as needed. ) 16 g 1  . olmesartan-hydrochlorothiazide (BENICAR HCT) 40-25 MG tablet Take 1 tablet Daily for BP & Fluid Retention / Ankle Swelling 90 tablet 0  . OVER THE COUNTER MEDICATION OTC acid reducer    . rOPINIRole (REQUIP) 0.25 MG tablet Take one tablet by mouth nightly, 10min-one hour prior to bed. 90 tablet 2  . rosuvastatin (CRESTOR) 40 MG tablet Take 1 tablet (40 mg total) by mouth daily. 90 tablet 1  . Vitamin D, Ergocalciferol, (DRISDOL) 1.25 MG (50000 UT) CAPS capsule TAKE 1 CAPSULE (50,000 UNITS TOTAL) BY MOUTH 3 (THREE) TIMES A WEEK. 36 capsule 19  . fluticasone (FLONASE) 50 MCG/ACT nasal spray PLACE 2 SPRAYS INTO BOTH NOSTRILS AT BEDTIME. 16 mL 1   No current facility-administered medications on file prior to visit.   Allergies:  No Known Allergies Medical History:  She has  Prediabetes; Hypertension; Hyperlipidemia; Environmental allergies; Morbid obesity ([place]); History of colon polyps; Medication management; Vitamin D deficiency; and CKD (chronic kidney disease) stage 2, GFR 60-89 ml/min on their problem list. Health Maintenance:   Immunization History  Administered Date(s) Administered  . Influenza Inj Mdck Quad With Preservative 01/11/2019  . Influenza,inj,Quad PF,6+ Mos 02/26/2018  . Influenza,inj,quad, With Preservative 12/26/2015  . Influenza-Unspecified 01/28/2014  . PPD Test 08/19/2013  . Td 09/06/2003  . Tdap 08/19/2013    Tetanus: 2015 Pneumovax: N/A Prevnar 13: N/A Flu vaccine:  Shingrix: Discussed with patient LMP: postmenopausal Pap: 04/2018 MGM: 04/2018 - [facility] DEXA: N/A Colonoscopy: 2015, due 2025 SARS-COV2 - Complete 06/2019  Last Dental Exam: Dr Gerlene Burdock 2021  Last Eye Exam: Due for 2021  Patient Care Team: Unk Pinto, MD as PCP - General (Internal Medicine) Molli Posey, MD as  Consulting Physician (Obstetrics and Gynecology)  Surgical History:  She has a past surgical history that includes Cesarean section; Umbilical hernia repair; Tubal ligation; and Colonoscopy with propofol (N/A, 12/15/2013). Family History:  Herfamily history includes Alcohol abuse in her mother; Cancer in her father; Cancer (age of onset: 50) in her sister; Diabetes in her paternal grandmother and son; Hypertension in her mother. Social History:  She reports that she has never smoked. She has never used smokeless tobacco. She reports that she does not drink alcohol and does not use drugs.  Review of Systems: Review of Systems  Constitutional: Negative for chills, diaphoresis, fever, malaise/fatigue and weight loss.  HENT: Negative for congestion, ear discharge, ear pain, hearing loss, nosebleeds, sinus pain, sore throat and tinnitus.   Eyes: Negative for blurred vision, double vision, photophobia, pain, discharge and redness.   Respiratory: Negative for cough, hemoptysis, sputum production, shortness of breath, wheezing and stridor.   Cardiovascular: Negative for chest pain, palpitations, orthopnea, claudication, leg swelling and PND.  Gastrointestinal: Negative for abdominal pain, blood in stool, constipation, diarrhea, heartburn, melena, nausea and vomiting.  Genitourinary: Positive for frequency. Negative for dysuria, flank pain, hematuria and urgency.  Musculoskeletal: Positive for myalgias. Negative for back pain, falls, joint pain and neck pain.       Bilateral leg pain.  Skin: Negative for itching and rash.  Neurological: Negative for dizziness, tingling, tremors, sensory change, speech change, focal weakness, seizures, loss of consciousness, weakness and headaches.  Endo/Heme/Allergies: Negative for environmental allergies and polydipsia. Does not bruise/bleed easily.  Psychiatric/Behavioral: Negative for depression, hallucinations, memory loss, substance abuse and suicidal ideas. The patient is not nervous/anxious and does not have insomnia.     Physical Exam: Estimated body mass index is 55.36 kg/m as calculated from the following:   Height as of this encounter: 5' 5.5" (1.664 m).   Weight as of this encounter: 337 lb 12.8 oz (153.2 kg). BP 134/85   Pulse 69   Temp (!) 96.8 F (36 C)   Ht 5' 5.5" (1.664 m)   Wt (!) 337 lb 12.8 oz (153.2 kg)   LMP 06/11/2014   SpO2 99%   BMI 55.36 kg/m  General Appearance: Well nourished, in no apparent distress.  Eyes: PERRLA, EOMs, conjunctiva no swelling or erythema, normal fundi and vessels.  Sinuses: No Frontal/maxillary tenderness  ENT/Mouth: Ext aud canals clear, normal light reflex with TMs without erythema, bulging. Good dentition. No erythema, swelling, or exudate on post pharynx. Tonsils not swollen or erythematous. Hearing normal.  Neck: Supple, thyroid normal. No bruits  Respiratory: Respiratory effort normal, BS equal bilaterally without rales,  rhonchi, wheezing or stridor.  Cardio: RRR without murmurs, rubs or gallops. Brisk peripheral pulses, +1 pitting edma bilaterally, right worse than left. Chest: symmetric, with normal excursions and percussion.  Breasts: defer Abdomen: Soft obese, nontender, no guarding, rebound, hernias, masses, or organomegaly.  Lymphatics: Non tender without lymphadenopathy.  Genitourinary: defer Musculoskeletal: Full ROM all peripheral extremities,5/5 strength, and normal gait. Point tenderness greater trochanter, bilaterally. Skin: Warm, dry without rashes, lesions, ecchymosis. Neuro: Cranial nerves intact, reflexes equal bilaterally. Normal muscle tone, no cerebellar symptoms. Sensation intact.  Psych: Awake and oriented X 3, normal affect, Insight and Judgment appropriate.   EKG: WNL no ST changes.    Garnet Sierras, Laqueta Jean, DNP [facility] 12/10/2019  10:31 AM

## 2019-12-10 ENCOUNTER — Other Ambulatory Visit: Payer: Self-pay

## 2019-12-10 ENCOUNTER — Ambulatory Visit (INDEPENDENT_AMBULATORY_CARE_PROVIDER_SITE_OTHER): Payer: No Typology Code available for payment source | Admitting: Adult Health Nurse Practitioner

## 2019-12-10 ENCOUNTER — Encounter: Payer: Self-pay | Admitting: Adult Health Nurse Practitioner

## 2019-12-10 ENCOUNTER — Other Ambulatory Visit: Payer: Self-pay | Admitting: *Deleted

## 2019-12-10 VITALS — BP 134/85 | HR 69 | Temp 96.8°F | Ht 65.5 in | Wt 337.8 lb

## 2019-12-10 DIAGNOSIS — Z0000 Encounter for general adult medical examination without abnormal findings: Secondary | ICD-10-CM

## 2019-12-10 DIAGNOSIS — M792 Neuralgia and neuritis, unspecified: Secondary | ICD-10-CM

## 2019-12-10 DIAGNOSIS — Z1321 Encounter for screening for nutritional disorder: Secondary | ICD-10-CM

## 2019-12-10 DIAGNOSIS — Z79899 Other long term (current) drug therapy: Secondary | ICD-10-CM

## 2019-12-10 DIAGNOSIS — Z136 Encounter for screening for cardiovascular disorders: Secondary | ICD-10-CM | POA: Diagnosis not present

## 2019-12-10 DIAGNOSIS — N182 Chronic kidney disease, stage 2 (mild): Secondary | ICD-10-CM

## 2019-12-10 DIAGNOSIS — Z0001 Encounter for general adult medical examination with abnormal findings: Secondary | ICD-10-CM

## 2019-12-10 DIAGNOSIS — I10 Essential (primary) hypertension: Secondary | ICD-10-CM | POA: Diagnosis not present

## 2019-12-10 DIAGNOSIS — Z1389 Encounter for screening for other disorder: Secondary | ICD-10-CM

## 2019-12-10 DIAGNOSIS — Z9109 Other allergy status, other than to drugs and biological substances: Secondary | ICD-10-CM

## 2019-12-10 DIAGNOSIS — E782 Mixed hyperlipidemia: Secondary | ICD-10-CM

## 2019-12-10 DIAGNOSIS — E559 Vitamin D deficiency, unspecified: Secondary | ICD-10-CM

## 2019-12-10 DIAGNOSIS — G2581 Restless legs syndrome: Secondary | ICD-10-CM

## 2019-12-10 DIAGNOSIS — R7309 Other abnormal glucose: Secondary | ICD-10-CM

## 2019-12-10 DIAGNOSIS — Z6841 Body Mass Index (BMI) 40.0 and over, adult: Secondary | ICD-10-CM

## 2019-12-10 DIAGNOSIS — Z713 Dietary counseling and surveillance: Secondary | ICD-10-CM

## 2019-12-10 MED ORDER — GABAPENTIN 100 MG PO CAPS: 100.0000 mg | ORAL_CAPSULE | Freq: Every day | ORAL | 0 refills | Status: DC

## 2019-12-10 MED ORDER — PHENTERMINE HCL 15 MG PO CAPS: 15.0000 mg | ORAL_CAPSULE | ORAL | 0 refills | Status: DC

## 2019-12-10 NOTE — Patient Instructions (Addendum)
We have sent in a prescription for phentermine 15mg .  Take this every morning 51min to 1hour prior to your first meal.  Increase your water intake as this can make your mouth dry and also cause constipation.  This first month we will work on cutting all sugary drinks.  Stop fruit juices, sweet tea, sodas and look at sugar content of any beverage you consume.  For example if your drink 4 number of sodas cut this in half.  Find a replacement for your nightly ice cram.  Slice bananas and put the rounds in a freezer bag.  Serving size is 1cup of this.  You can also try another fruit option to help fulfill you want for sweets.  Take your friend up on her offer to walk.  Increasing activity improves our physical and mental health  Every month another small step will be added to help support the medication prescribed.  The medicine alone will not be effective.  It takes lifestyle and behavior changes to be successful with your goals and to maintain them.  Small changes make a large impact!  We will follow up in one month to check on your progress and a weight check.    To help improve your leg pain and sleep we aee going to try 100mg  of gabapentin.  Take this 74min to on hours prior to going to sleep.  Do not drive or operate machinery until you know how this medication affects you.      Ask insurance and pharmacy about shingrix - it is a 2 part shot that we will not be getting in the office.   Suggest getting AFTER covid vaccines, have to wait at least a month This shot can make you feel bad due to such good immune response it can trigger some inflammation so take tylenol or aleve day of or day after and plan on resting.   Can go to [email or web address] for more information  Shingrix Vaccination  Two vaccines are licensed and recommended to prevent shingles in the [place] Zoster vaccine live (ZVL, Zostavax) has been in use since 2006.  Recombinant zoster vaccine (RZV, Shingrix), has been in use since 2017 and is recommended by ACIP as the preferred shingles vaccine.  What Everyone Should Know about Shingles Vaccine (Shingrix) One of the Recommended Vaccines by Disease Shingles vaccination is the only way to protect against shingles and postherpetic neuralgia (PHN), the most common complication from shingles. CDC recommends that healthy adults 50 years and older get two doses of the shingles vaccine called Shingrix (recombinant zoster vaccine), separated by 2 to 6 months, to prevent shingles and the complications from the disease. Your doctor or pharmacist can give you Shingrix as a shot in your upper arm. Shingrix provides strong protection against shingles and PHN. Two doses of Shingrix is more than 90% effective at preventing shingles and PHN. Protection stays above 85% for at least the first four years after you get vaccinated. Shingrix is the preferred vaccine, over Zostavax (zoster vaccine live), a shingles vaccine in use since 2006. Zostavax may still be used to prevent shingles in healthy adults 60 years and older. For example, you could use Zostavax if a person is allergic to Shingrix, prefers Zostavax, or requests immediate vaccination and Shingrix is unavailable. Who Should Get Shingrix? Healthy adults 50 years and older should get two doses of Shingrix, separated by 2 to 6 months. You should get Shingrix even if in the past you . had shingles  .  received Zostavax  . are not sure if you had chickenpox There is no maximum age for getting Shingrix. If you had shingles in the past, you can get Shingrix to help prevent future occurrences of the disease. There is no specific length of time that you need to wait after having shingles before you can receive Shingrix, but generally you should make sure the shingles rash has gone away before getting vaccinated. You can get Shingrix whether or not you remember having had chickenpox  in the past. Studies show that more than 99% of Americans 40 years and older have had chickenpox, even if they don't remember having the disease. Chickenpox and shingles are related because they are caused by the same virus (varicella zoster virus). After a person recovers from chickenpox, the virus stays dormant (inactive) in the body. It can reactivate years later and cause shingles. If you had Zostavax in the recent past, you should wait at least eight weeks before getting Shingrix. Talk to your healthcare provider to determine the best time to get Shingrix. Shingrix is available in [employer] and pharmacies. To find doctor's offices or pharmacies near you that offer the vaccine, visit HealthMap Vaccine FinderExternal. If you have questions about Shingrix, talk with your healthcare provider. Vaccine for Those 71 Years and Older  Shingrix reduces the risk of shingles and PHN by more than 90% in people 58 and older. CDC recommends the vaccine for healthy adults 49 and older.  Who Should Not Get Shingrix? You should not get Shingrix if you: . have ever had a severe allergic reaction to any component of the vaccine or after a dose of Shingrix  . tested negative for immunity to varicella zoster virus. If you test negative, you should get chickenpox vaccine.  . currently have shingles  . currently are pregnant or breastfeeding. Women who are pregnant or breastfeeding should wait to get Shingrix.  Marland Kitchen receive specific antiviral drugs (acyclovir, famciclovir, or valacyclovir) 24 hours before vaccination (avoid use of these antiviral drugs for 14 days after vaccination)- zoster vaccine live only If you have a minor acute (starts suddenly) illness, such as a cold, you may get Shingrix. But if you have a moderate or severe acute illness, you should usually wait until you recover before getting the vaccine. This includes anyone with a temperature of 101.55F or higher. The side effects of the Shingrix are  temporary, and usually last 2 to 3 days. While you may experience pain for a few days after getting Shingrix, the pain will be less severe than having shingles and the complications from the disease. How Well Does Shingrix Work? Two doses of Shingrix provides strong protection against shingles and postherpetic neuralgia (PHN), the most common complication of shingles. . In adults 88 to 57 years old who got two doses, Shingrix was 97% effective in preventing shingles; among adults 70 years and older, Shingrix was 91% effective.  . In adults 25 to 57 years old who got two doses, Shingrix was 91% effective in preventing PHN; among adults 70 years and older, Shingrix was 89% effective. Shingrix protection remained high (more than 85%) in people 70 years and older throughout the four years following vaccination. Since your risk of shingles and PHN increases as you get older, it is important to have strong protection against shingles in your older years. Top of Page  What Are the Possible Side Effects of Shingrix? Studies show that Shingrix is safe. The vaccine helps your body create a strong defense  against shingles. As a result, you are likely to have temporary side effects from getting the shots. The side effects may affect your ability to do normal daily activities for 2 to 3 days. Most people got a sore arm with mild or moderate pain after getting Shingrix, and some also had redness and swelling where they got the shot. Some people felt tired, had muscle pain, a headache, shivering, fever, stomach pain, or nausea. About 1 out of 6 people who got Shingrix experienced side effects that prevented them from doing regular activities. Symptoms went away on their own in about 2 to 3 days. Side effects were more common in younger people. You might have a reaction to the first or second dose of Shingrix, or both doses. If you experience side effects, you may choose to take over-the-counter pain medicine such as  ibuprofen or acetaminophen. If you experience side effects from Shingrix, you should report them to the Vaccine Adverse Event Reporting System (VAERS). Your doctor might file this report, or you can do it yourself through the VAERS websiteExternal, or by calling [phone]. If you have any questions about side effects from Shingrix, talk with your doctor. The shingles vaccine does not contain thimerosal (a preservative containing mercury). Top of Page  When Should I See a Doctor Because of the Side Effects I Experience From Shingrix? In clinical trials, Shingrix was not associated with serious adverse events. In fact, serious side effects from vaccines are extremely rare. For example, for every 1 million doses of a vaccine given, only one or two people may have a severe allergic reaction. Signs of an allergic reaction happen within minutes or hours after vaccination and include hives, swelling of the face and throat, difficulty breathing, a fast heartbeat, dizziness, or weakness. If you experience these or any other life-threatening symptoms, see a doctor right away. Shingrix causes a strong response in your immune system, so it may produce short-term side effects more intense than you are used to from other vaccines. These side effects can be uncomfortable, but they are expected and usually go away on their own in 2 or 3 days. Top of Page  How Can I Pay For Shingrix? There are several ways shingles vaccine may be paid for: Medicare . Medicare Part D plans cover the shingles vaccine, but there may be a cost to you depending on your plan. There may be a copay for the vaccine, or you may need to pay in full then get reimbursed for a certain amount.  . Medicare Part B does not cover the shingles vaccine. Medicaid . Medicaid may or may not cover the vaccine. Contact your insurer to find out. Private health insurance . Many private health insurance plans will cover the vaccine, but there may be a  cost to you depending on your plan. Contact your insurer to find out. Vaccine assistance programs . Some pharmaceutical companies provide vaccines to eligible adults who cannot afford them. You may want to check with the vaccine manufacturer, [employer], about Shingrix. If you do not currently have health insurance, learn more about affordable health coverage optionsExternal. To find doctor's offices or pharmacies near you that offer the vaccine, visit HealthMap Vaccine FinderExternal.    8 Critical Weight-Loss Tips That Aren't Diet and Exercise  1. STARVE THE DISTRACTIONS  All too often when we eat, we're also multitasking: watching TV, answering emails, scrolling through social media. These habits are detrimental to having a strong, clear, healthy relationship with food, and they can  hinder our ability to make dietary changes.  In order to truly focus on what you're eating, how much you're eating, why you're eating those specific foods and, most importantly, how those foods make you feel, you need to starve the distractions. That means when you eat, just eat. Focus on your food, the process it went through to end up on your plate, where it came from and how it nourishes you. With this technique, you're more likely to finish a meal feeling satiated.  2.  CONSIDER WHAT YOU'RE NOT WILLING TO DO  This might sound counterintuitive, but it can help provide a "why" when motivation is waning. Declare, in writing, what you are unwilling to do, for example "I am unwilling to be the old dad who cannot play sports with my children".  So consider what you're not willing to accept, write it down, and keep it at the ready.  3.  STOP LABELING FOOD "GOOD" AND "BAD"  You've probably heard someone say they ate something "bad." Maybe you've even said it yourself.  The trouble with 'bad' foods isn't that they'll send you to the grave after a bite or two. The trouble comes when we eat excessive portions  of really calorie-dense foods meal after meal, day after day.  Instead of labeling foods as good or bad, think about which foods you can eat a lot of, and which ones you should just eat a little of. Then, plan ways to eat the foods you really like in portions that fit with your overall goals. A good example of this would be having a slice of pizza alongside a club salad with chicken breast, avocado and a bit of dressing. This is vastly different than 3 slices of pizza, 4 breadsticks with cheese sauce and half of a liter of regular soda.  4.  BRUSH YOUR TEETH AFTER YOU EAT  Getting your mindset in order is important, but sometimes small habits can make a big difference. After eating, you still have the taste of food in their mouth, which often causes people to eat more even if they are full or engage in a nibble or two of dessert.  Brushing your teeth will remove the taste of food from your mouth, and the clean, minty freshness will serve as a cue that mealtime is over.  5.  FOCUS ON CROWDING NOT CUTTING  The most common first step during 'dieting' is to cut. We cut our portion sizes down, we cut out 'bad' foods, we cut out entire food groups. This act of cutting puts [place] and our minds into scarcity mode.  When something is off-limits, even if you're able to avoid it for a while, you could end up bingeing on it later because you've gone so long without it. So, instead of cutting, focus on crowding. If you crowd your plate and fill it up with more foods like veggies and protein, it simply allows less room for the other stuff. In other words, shift your focus away from what you can't eat, and celebrate the foods that will help you reach your goals.  6.  TAKE TRACKING A STEP FURTHER  Track what you eat, when you ate it, how much you ate and how that food made you feel. Being completely honest with yourself and writing down every single thing that passes through your lips will help you start to notice that  maybe you actually do snack, possibly take in more sugar than you thought, eat when you're bored  rather than just hungry or maybe that you have a habit of snacking before bed while watching TV.  The difference from simply tracking your food intake is you're taking into account how food makes you feel, as well as what you're doing while you're eating. This is about becoming more mindful of what, when and why you eat.  7.  PRIORITIZE GOOD SLEEP  One of the strongest risk factors for being overweight is poor sleep. When you're feeling tired, you're more likely to choose unhealthy comfort foods and to skip your workout. Additionally, sleep deprivation may slow down your metabolism. [employer]! Therefore, sleeping 7-8 hours per night can help with weight loss without having to change your diet or increase your physical activity. And if you feel you snore and still wake up tired, talk with me about sleep apnea.  8.  SET ASIDE TIME TO DISCONNECT  Just get out there. Disconnect from the electronics and connect to the elements. Not only will this help reduce stress (a major factor in weight gain) by giving your mind a break from the constant stimulation we've all become so accustomed to, but it may also reprogram your brain to connect with yourself and what you're feeling.

## 2019-12-11 LAB — NO CULTURE INDICATED

## 2019-12-11 LAB — CBC WITH DIFFERENTIAL/PLATELET
Absolute Monocytes: 475 cells/uL (ref 200–950)
Basophils Absolute: 20 cells/uL (ref 0–200)
Basophils Relative: 0.4 %
Eosinophils Absolute: 69 cells/uL (ref 15–500)
Eosinophils Relative: 1.4 %
HCT: 40.5 % (ref 35.0–45.0)
Hemoglobin: 12.8 g/dL (ref 11.7–15.5)
Lymphs Abs: 1945 cells/uL (ref 850–3900)
MCH: 27.6 pg (ref 27.0–33.0)
MCHC: 31.6 g/dL — ABNORMAL LOW (ref 32.0–36.0)
MCV: 87.3 fL (ref 80.0–100.0)
MPV: 11.2 fL (ref 7.5–12.5)
Monocytes Relative: 9.7 %
Neutro Abs: 2391 cells/uL (ref 1500–7800)
Neutrophils Relative %: 48.8 %
Platelets: 296 10*3/uL (ref 140–400)
RBC: 4.64 10*6/uL (ref 3.80–5.10)
RDW: 13.8 % (ref 11.0–15.0)
Total Lymphocyte: 39.7 %
WBC: 4.9 10*3/uL (ref 3.8–10.8)

## 2019-12-11 LAB — LIPID PANEL
Cholesterol: 164 mg/dL (ref <200)
HDL: 53 mg/dL (ref >=50)
LDL Cholesterol (Calc): 91 mg/dL (calc)
Non-HDL Cholesterol (Calc): 111 mg/dL (calc) (ref <130)
Total CHOL/HDL Ratio: 3.1 (calc) (ref <5.0)
Triglycerides: 106 mg/dL (ref <150)

## 2019-12-11 LAB — URINALYSIS W MICROSCOPIC + REFLEX CULTURE
Bacteria, UA: NONE SEEN /HPF
Bilirubin Urine: NEGATIVE
Glucose, UA: NEGATIVE
Hgb urine dipstick: NEGATIVE
Hyaline Cast: NONE SEEN /LPF
Ketones, ur: NEGATIVE
Leukocyte Esterase: NEGATIVE
Nitrites, Initial: NEGATIVE
Protein, ur: NEGATIVE
Specific Gravity, Urine: 1.005 (ref 1.001–1.03)
WBC, UA: NONE SEEN /HPF (ref 0–5)
pH: 6.5 (ref 5.0–8.0)

## 2019-12-11 LAB — MICROALBUMIN / CREATININE URINE RATIO
Creatinine, Urine: 35 mg/dL (ref 20–275)
Microalb Creat Ratio: 9 mcg/mg creat (ref <30)
Microalb, Ur: 0.3 mg/dL

## 2019-12-11 LAB — COMPLETE METABOLIC PANEL WITH GFR
AG Ratio: 1.7 (calc) (ref 1.0–2.5)
ALT: 25 U/L (ref 6–29)
AST: 30 U/L (ref 10–35)
Albumin: 4.1 g/dL (ref 3.6–5.1)
Alkaline phosphatase (APISO): 38 U/L (ref 37–153)
BUN: 11 mg/dL (ref 7–25)
CO2: 32 mmol/L (ref 20–32)
Calcium: 9.2 mg/dL (ref 8.6–10.4)
Chloride: 102 mmol/L (ref 98–110)
Creat: 0.95 mg/dL (ref 0.50–1.05)
GFR, Est African American: 78 mL/min/{1.73_m2} (ref >=60)
GFR, Est Non African American: 67 mL/min/{1.73_m2} (ref >=60)
Globulin: 2.4 g/dL (calc) (ref 1.9–3.7)
Glucose, Bld: 113 mg/dL — ABNORMAL HIGH (ref 65–99)
Potassium: 4.2 mmol/L (ref 3.5–5.3)
Sodium: 142 mmol/L (ref 135–146)
Total Bilirubin: 0.5 mg/dL (ref 0.2–1.2)
Total Protein: 6.5 g/dL (ref 6.1–8.1)

## 2019-12-11 LAB — TSH: TSH: 2.91 mIU/L (ref 0.40–4.50)

## 2019-12-11 LAB — IRON, TOTAL/TOTAL IRON BINDING CAP
%SAT: 18 % (calc) (ref 16–45)
Iron: 61 ug/dL (ref 45–160)
TIBC: 342 mcg/dL (calc) (ref 250–450)

## 2019-12-11 LAB — VITAMIN B12: Vitamin B-12: 388 pg/mL (ref 200–1100)

## 2019-12-11 LAB — HEMOGLOBIN A1C
Hgb A1c MFr Bld: 7.1 % of total Hgb — ABNORMAL HIGH (ref <5.7)
Mean Plasma Glucose: 157 (calc)
eAG (mmol/L): 8.7 (calc)

## 2019-12-11 LAB — VITAMIN D 25 HYDROXY (VIT D DEFICIENCY, FRACTURES): Vit D, 25-Hydroxy: 27 ng/mL — ABNORMAL LOW (ref 30–100)

## 2019-12-11 LAB — MAGNESIUM: Magnesium: 1.9 mg/dL (ref 1.5–2.5)

## 2019-12-11 NOTE — Progress Notes (Signed)
Patient is aware of lab results and instructions. She does need another rx for Vitamin D 50,000 IU's sent to [facility] on [address]

## 2019-12-13 ENCOUNTER — Other Ambulatory Visit: Payer: Self-pay | Admitting: Adult Health Nurse Practitioner

## 2019-12-13 DIAGNOSIS — E559 Vitamin D deficiency, unspecified: Secondary | ICD-10-CM

## 2019-12-13 MED ORDER — VITAMIN D3 1.25 MG (50000 UT) PO CAPS: ORAL_CAPSULE | ORAL | 0 refills | Status: DC

## 2020-01-13 ENCOUNTER — Ambulatory Visit: Payer: No Typology Code available for payment source | Admitting: Adult Health Nurse Practitioner

## 2020-02-28 ENCOUNTER — Other Ambulatory Visit: Payer: Self-pay | Admitting: Internal Medicine

## 2020-03-07 ENCOUNTER — Other Ambulatory Visit: Payer: Self-pay

## 2020-03-07 ENCOUNTER — Ambulatory Visit: Payer: Self-pay | Attending: Internal Medicine

## 2020-03-07 DIAGNOSIS — Z23 Encounter for immunization: Secondary | ICD-10-CM

## 2020-03-07 NOTE — Progress Notes (Signed)
   Covid-19 Vaccination Clinic  Name:  WANA MOUNT    MRN: [id number] DOB: May 22, 1960  03/07/2020  Ms. Simko was observed post Covid-19 immunization for 15 minutes without incident. She was provided with Vaccine Information Sheet and instruction to access the V-Safe system.   Ms. Lanius was instructed to call 911 with any severe reactions post vaccine: Marland Kitchen Difficulty breathing  . Swelling of face and throat  . A fast heartbeat  . A bad rash all over body  . Dizziness and weakness   Immunizations Administered    Name Date Dose VIS Date Route   Pfizer COVID-19 Vaccine 03/07/2020  3:01 PM 0.3 mL 12/30/2019 Intramuscular   Manufacturer: [employer], [employer]   Lot: [id number]   NDC: [address]-1

## 2020-05-31 ENCOUNTER — Other Ambulatory Visit: Payer: Self-pay

## 2020-05-31 MED ORDER — ROSUVASTATIN CALCIUM 40 MG PO TABS
40.0000 mg | ORAL_TABLET | Freq: Every day | ORAL | 1 refills | Status: DC
Start: 2020-05-31 — End: 2021-09-25

## 2020-05-31 NOTE — Telephone Encounter (Signed)
Refill on CRESTOR 40 mgs

## 2020-07-04 ENCOUNTER — Telehealth: Payer: Self-pay

## 2020-07-04 NOTE — Telephone Encounter (Signed)
Patient states that her allergies are really bad. Has been taking OTC Zyrtec but isn't helping. Requesting suggestions or if something can be sent into the pharmacy for her. Please advise.

## 2020-07-04 NOTE — Telephone Encounter (Signed)
States that she is coughing and sneezing. Has a hard time sleeping because of the coughing. Advised to switch to Fexofenadine and try Flonase as well as Robitussin DM at bedtime.

## 2020-07-20 ENCOUNTER — Other Ambulatory Visit: Payer: Self-pay | Admitting: Internal Medicine

## 2020-07-20 MED ORDER — CETIRIZINE-PSEUDOEPHEDRINE ER 5-120 MG PO TB12: ORAL_TABLET | ORAL | 0 refills | Status: DC

## 2020-07-20 MED ORDER — DEXAMETHASONE 4 MG PO TABS: ORAL_TABLET | ORAL | 0 refills | Status: DC

## 2020-08-26 ENCOUNTER — Other Ambulatory Visit: Payer: Self-pay | Admitting: Obstetrics and Gynecology

## 2020-08-26 DIAGNOSIS — N950 Postmenopausal bleeding: Secondary | ICD-10-CM

## 2020-09-07 ENCOUNTER — Other Ambulatory Visit: Payer: Self-pay | Admitting: Internal Medicine

## 2020-09-07 MED ORDER — BENZONATATE 200 MG PO CAPS: ORAL_CAPSULE | ORAL | 0 refills | Status: DC

## 2020-09-11 ENCOUNTER — Other Ambulatory Visit: Payer: Self-pay

## 2020-09-11 ENCOUNTER — Ambulatory Visit
Admission: RE | Admit: 2020-09-11 | Discharge: 2020-09-11 | Disposition: A | Discharge status: Home / Self Care | Payer: No Typology Code available for payment source | Source: Ambulatory Visit | Attending: Obstetrics and Gynecology | Admitting: Obstetrics and Gynecology

## 2020-09-11 DIAGNOSIS — N950 Postmenopausal bleeding: Secondary | ICD-10-CM

## 2020-10-05 NOTE — Progress Notes (Signed)
Assessment and Plan:  There are no diagnoses linked to this encounter.  [name] was seen today for acute visit and shoulder pain.  Diagnoses and all orders for this visit:  Skin cyst -     cephALEXin (KEFLEX) 500 MG capsule; Take 1 capsule (500 mg total) by mouth 4 (four) times daily for 10 days.       - If the area on back does not resolve with use of antibiotics she is to call to schedule appt with Dr. Melford Frank for removal.   Essential hypertension -     CBC with Differential/Platelet       -  continue medications, DASH diet, exercise and monitor at home. Call if greater than 130/80.    Mixed hyperlipidemia -     COMPLETE METABOLIC PANEL WITH GFR -     Lipid panel -     TSH       - Continue medications:        - Continue diet and exercise.    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        - Perform daily foot/skin check, notify office of any concerning changes.        - Check A1C   Medication management -     CBC with Differential/Platelet -     COMPLETE METABOLIC PANEL WITH GFR -     Lipid panel -     TSH -     Hemoglobin A1c -     VITAMIN D 25 Hydroxy (Vit-D Deficiency, Fractures) -     Magnesium   Vitamin D deficiency -     VITAMIN D 25 Hydroxy (Vit-D Deficiency, Fractures) -     Cholecalciferol (VITAMIN D3) 1.25 MG (50000 UT) CAPS; Take one tablet, three days a week for twelve weeks.    Further disposition pending results of labs. Discussed med's effects and SE's.   Over 30 minutes of exam, counseling, chart review, and critical decision making was performed.   Future Appointments  Date Time Provider [place]  12/08/2020 10:00 AM Catherine Bernheim, NP [facility] None    ------------------------------------------------------------------------------------------------------------------   HPI BP 128/72 (BP Location: Left Wrist)   Pulse 90   Temp (!) 97.5 F  (36.4 C)   Wt (!) 331 lb 3.2 oz (150.2 kg)   LMP 06/11/2014   SpO2 96%   BMI 54.28 kg/m  60 y.o.female presents for lump in back that become tender that has been there for about 2 weeks and has increased in size.     BMI is Body mass index is 54.28 kg/m., she has been working on diet . Has not been exercising Wt Readings from Last 3 Encounters:  10/07/20 (!) 331 lb 3.2 oz (150.2 kg)  12/10/19 (!) 337 lb 12.8 oz (153.2 kg)  02/24/19 (!) 336 lb (152.4 kg)   Pt has hypertension and blood pressures have been controlled on Benicar HCT 40/25 BP Readings from Last 3 Encounters:  10/07/20 128/72  12/10/19 134/85  02/24/19 132/78    Pt is on Rosuvastatin 40 mg QD for cholesterol.  Denies myalgias.  Her Cholesterol is not at goal, LDL still above 70 Lab Results  Component Value Date   CHOL 164 12/10/2019   HDL 53 12/10/2019   LDLCALC 91 12/10/2019   TRIG 106 12/10/2019   CHOLHDL 3.1  12/10/2019   Her blood sugars are not at goal and is not on any medication. Last A1C was  Lab Results  Component Value Date   HGBA1C 7.1 (H) 12/10/2019    Her last GFR was CKD stage 2 Lab Results  Component Value Date   GFRAA 78 12/10/2019    She is on Vit D supplementation 50000 units three days a week  Last vitamin D Lab Results  Component Value Date   VD25OH 27 (L) 12/10/2019      No Known Allergies  Current Outpatient Medications on File Prior to Visit  Medication Sig   benzonatate (TESSALON) 200 MG capsule Take 1 perle 3 x / day to prevent cough   cetirizine (ZYRTEC) 10 MG tablet Take 10 mg by mouth daily as needed for allergies.    Cholecalciferol (VITAMIN D3) 1.25 MG (50000 UT) CAPS Take one tablet, three days a week for twelve weeks.   olmesartan-hydrochlorothiazide (BENICAR HCT) 40-25 MG tablet Take      1 tablet       Daily       for BP & Fluid Retention /Ankle Swelling   OVER THE COUNTER MEDICATION OTC acid reducer   rosuvastatin (CRESTOR) 40 MG tablet Take 1 tablet (40 mg total)  by mouth daily.   No current facility-administered medications on file prior to visit.   Review of Systems  Constitutional:  Negative for chills, fever and weight loss.  HENT:  Negative for congestion and hearing loss.   Eyes:  Negative for blurred vision and double vision.  Respiratory:  Negative for cough and shortness of breath.   Cardiovascular:  Negative for chest pain, palpitations, orthopnea and leg swelling.  Gastrointestinal:  Negative for abdominal pain, constipation, diarrhea, heartburn, nausea and vomiting.  Musculoskeletal:  Negative for falls, joint pain and myalgias.  Skin:  Negative for rash.       Swollen area mid back, tender to touch  Neurological:  Negative for dizziness, tingling, tremors, loss of consciousness and headaches.  Psychiatric/Behavioral:  Negative for depression, memory loss and suicidal ideas.    Physical Exam:  BP 128/72 (BP Location: Left Wrist)   Pulse 90   Temp (!) 97.5 F (36.4 C)   Wt (!) 331 lb 3.2 oz (150.2 kg)   LMP 06/11/2014   SpO2 96%   BMI 54.28 kg/m   General Appearance: Well nourished, in no apparent distress. Eyes: PERRLA, EOMs, conjunctiva no swelling or erythema Sinuses: No Frontal/maxillary tenderness ENT/Mouth: Ext aud canals clear, TMs without erythema, bulging. No erythema, swelling, or exudate on post pharynx.  Tonsils not swollen or erythematous. Hearing normal.  Neck: Supple, thyroid normal.  Respiratory: Respiratory effort normal, BS equal bilaterally without rales, rhonchi, wheezing or stridor.  Cardio: RRR with no MRGs. Brisk peripheral pulses without edema.  Abdomen: Soft, + BS.  Non tender, no guarding, rebound, hernias, masses. Lymphatics: Non tender without lymphadenopathy.  Musculoskeletal: Full ROM, 5/5 strength, normal gait.  Skin: Warm, dry without rashes, lesions, ecchymosis.  Neuro: Cranial nerves intact. Normal muscle tone, no cerebellar symptoms. Sensation intact.  Psych: Awake and oriented X 3, normal  affect, Insight and Judgment appropriate.     Catherine Bernheim, NP 11:14 AM [facility]

## 2020-10-07 ENCOUNTER — Other Ambulatory Visit: Payer: Self-pay

## 2020-10-07 ENCOUNTER — Ambulatory Visit (INDEPENDENT_AMBULATORY_CARE_PROVIDER_SITE_OTHER): Payer: No Typology Code available for payment source | Admitting: Nurse Practitioner

## 2020-10-07 ENCOUNTER — Encounter: Payer: Self-pay | Admitting: Nurse Practitioner

## 2020-10-07 VITALS — BP 128/72 | HR 90 | Temp 97.5°F | Wt 331.2 lb

## 2020-10-07 DIAGNOSIS — N182 Chronic kidney disease, stage 2 (mild): Secondary | ICD-10-CM

## 2020-10-07 DIAGNOSIS — E1122 Type 2 diabetes mellitus with diabetic chronic kidney disease: Secondary | ICD-10-CM

## 2020-10-07 DIAGNOSIS — L729 Follicular cyst of the skin and subcutaneous tissue, unspecified: Secondary | ICD-10-CM

## 2020-10-07 DIAGNOSIS — I10 Essential (primary) hypertension: Secondary | ICD-10-CM

## 2020-10-07 DIAGNOSIS — E559 Vitamin D deficiency, unspecified: Secondary | ICD-10-CM

## 2020-10-07 DIAGNOSIS — Z79899 Other long term (current) drug therapy: Secondary | ICD-10-CM

## 2020-10-07 DIAGNOSIS — M62838 Other muscle spasm: Secondary | ICD-10-CM

## 2020-10-07 DIAGNOSIS — E782 Mixed hyperlipidemia: Secondary | ICD-10-CM

## 2020-10-07 DIAGNOSIS — R7309 Other abnormal glucose: Secondary | ICD-10-CM

## 2020-10-07 MED ORDER — VITAMIN D3 1.25 MG (50000 UT) PO CAPS: ORAL_CAPSULE | ORAL | 0 refills | Status: DC

## 2020-10-07 MED ORDER — CEPHALEXIN 500 MG PO CAPS: 500.0000 mg | ORAL_CAPSULE | Freq: Four times a day (QID) | ORAL | 0 refills | Status: AC

## 2020-10-07 NOTE — Patient Instructions (Signed)
If the area on your back does not resolve or gets bigger call the office and we will schedule an appointment with Dr. Melford Aase for removal.    [place]   Know what a healthy weight is for you (roughly BMI <25) and aim to maintain this   Aim for 7+ servings of fruits and vegetables daily   70-80+ fluid ounces of water or unsweet tea for healthy kidneys   Limit to max 1 drink of alcohol per day; avoid smoking/tobacco   Limit animal fats in diet for cholesterol and heart health - choose grass fed whenever available   Avoid highly processed foods, and foods high in saturated/trans fats   Aim for low stress - take time to unwind and care for your mental health   Aim for 150 min of moderate intensity exercise weekly for heart health, and weights twice weekly for bone health   Aim for 7-9 hours of sleep daily

## 2020-10-08 LAB — CBC WITH DIFFERENTIAL/PLATELET
Absolute Monocytes: 454 cells/uL (ref 200–950)
Basophils Absolute: 20 cells/uL (ref 0–200)
Basophils Relative: 0.4 %
Eosinophils Absolute: 102 cells/uL (ref 15–500)
Eosinophils Relative: 2 %
HCT: 40.4 % (ref 35.0–45.0)
Hemoglobin: 13.2 g/dL (ref 11.7–15.5)
Lymphs Abs: 2474 cells/uL (ref 850–3900)
MCH: 28.3 pg (ref 27.0–33.0)
MCHC: 32.7 g/dL (ref 32.0–36.0)
MCV: 86.7 fL (ref 80.0–100.0)
MPV: 10.7 fL (ref 7.5–12.5)
Monocytes Relative: 8.9 %
Neutro Abs: 2050 cells/uL (ref 1500–7800)
Neutrophils Relative %: 40.2 %
Platelets: 368 10*3/uL (ref 140–400)
RBC: 4.66 10*6/uL (ref 3.80–5.10)
RDW: 14 % (ref 11.0–15.0)
Total Lymphocyte: 48.5 %
WBC: 5.1 10*3/uL (ref 3.8–10.8)

## 2020-10-08 LAB — COMPLETE METABOLIC PANEL WITH GFR
AG Ratio: 2 (calc) (ref 1.0–2.5)
ALT: 31 U/L — ABNORMAL HIGH (ref 6–29)
AST: 41 U/L — ABNORMAL HIGH (ref 10–35)
Albumin: 4.3 g/dL (ref 3.6–5.1)
Alkaline phosphatase (APISO): 35 U/L — ABNORMAL LOW (ref 37–153)
BUN: 14 mg/dL (ref 7–25)
CO2: 33 mmol/L — ABNORMAL HIGH (ref 20–32)
Calcium: 9.9 mg/dL (ref 8.6–10.4)
Chloride: 100 mmol/L (ref 98–110)
Creat: 1.01 mg/dL (ref 0.50–1.03)
Globulin: 2.1 g/dL (calc) (ref 1.9–3.7)
Glucose, Bld: 106 mg/dL — ABNORMAL HIGH (ref 65–99)
Potassium: 4.1 mmol/L (ref 3.5–5.3)
Sodium: 141 mmol/L (ref 135–146)
Total Bilirubin: 0.5 mg/dL (ref 0.2–1.2)
Total Protein: 6.4 g/dL (ref 6.1–8.1)
eGFR: 65 mL/min/{1.73_m2} (ref >=60)

## 2020-10-08 LAB — MAGNESIUM: Magnesium: 1.8 mg/dL (ref 1.5–2.5)

## 2020-10-08 LAB — LIPID PANEL
Cholesterol: 157 mg/dL (ref <200)
HDL: 51 mg/dL (ref >=50)
LDL Cholesterol (Calc): 86 mg/dL (calc)
Non-HDL Cholesterol (Calc): 106 mg/dL (calc) (ref <130)
Total CHOL/HDL Ratio: 3.1 (calc) (ref <5.0)
Triglycerides: 100 mg/dL (ref <150)

## 2020-10-08 LAB — VITAMIN D 25 HYDROXY (VIT D DEFICIENCY, FRACTURES): Vit D, 25-Hydroxy: 33 ng/mL (ref 30–100)

## 2020-10-08 LAB — HEMOGLOBIN A1C
Hgb A1c MFr Bld: 7.5 % of total Hgb — ABNORMAL HIGH (ref <5.7)
Mean Plasma Glucose: 169 mg/dL
eAG (mmol/L): 9.3 mmol/L

## 2020-10-08 LAB — TSH: TSH: 2.12 mIU/L (ref 0.40–4.50)

## 2020-12-06 ENCOUNTER — Other Ambulatory Visit: Payer: Self-pay

## 2020-12-06 ENCOUNTER — Encounter: Payer: Self-pay | Admitting: Nurse Practitioner

## 2020-12-06 ENCOUNTER — Ambulatory Visit (INDEPENDENT_AMBULATORY_CARE_PROVIDER_SITE_OTHER): Payer: Commercial Managed Care - PPO | Admitting: Nurse Practitioner

## 2020-12-06 VITALS — BP 142/81 | HR 86 | Temp 97.2°F | Wt 340.2 lb

## 2020-12-06 DIAGNOSIS — Z1152 Encounter for screening for COVID-19: Secondary | ICD-10-CM | POA: Diagnosis not present

## 2020-12-06 DIAGNOSIS — H6691 Otitis media, unspecified, right ear: Secondary | ICD-10-CM | POA: Diagnosis not present

## 2020-12-06 DIAGNOSIS — R059 Cough, unspecified: Secondary | ICD-10-CM

## 2020-12-06 LAB — POC COVID19 BINAXNOW: SARS Coronavirus 2 Ag: NEGATIVE

## 2020-12-06 MED ORDER — BENZONATATE 100 MG PO CAPS: 100.0000 mg | ORAL_CAPSULE | Freq: Four times a day (QID) | ORAL | 1 refills | Status: DC | PRN

## 2020-12-06 MED ORDER — AZITHROMYCIN 250 MG PO TABS: ORAL_TABLET | ORAL | 1 refills | Status: DC

## 2020-12-06 NOTE — Progress Notes (Signed)
Assessment and Plan: [name] was seen today for acute visit.  Diagnoses and all orders for this visit:  Acute right otitis media -     azithromycin (ZITHROMAX) 250 MG tablet; Take 2 tablets (500 mg) on  Day 1,  followed by 1 tablet (250 mg) once daily on Days 2 through 5.  Mucinex DM as needed  Monitor symptoms, if no improvement with antibiotics call the office.  Encounter for screening for COVID-19 -     POC COVID-19  Cough -     benzonatate (TESSALON PERLES) 100 MG capsule; Take 1 capsule (100 mg total) by mouth every 6 (six) hours as needed for cough.      Further disposition pending results of labs. Discussed med's effects and SE's.   Over 20 minutes of exam, counseling, chart review, and critical decision making was performed.   Future Appointments  Date Time Provider [place]  12/08/2020 10:00 AM Catherine Bernheim, NP [facility] None    ------------------------------------------------------------------------------------------------------------------   HPI BP (!) 142/81   Pulse 86   Temp (!) 97.2 F (36.2 C)   Wt (!) 340 lb 3.2 oz (154.3 kg)   LMP 06/11/2014   SpO2 98%   BMI 55.75 kg/m  60 y.o.female presents for cough and ear pain.   Pt states starting last week she began to have a cough productive of light green mucus and scratchy throat. Starting last night began having an itchy throat. Has tried allergy pills and cough medicine.  Denies fevers, body aches, nausea, vomiting , diarrhea.  BMI is Body mass index is 55.75 kg/m., she has not been working on diet and exercise. Wt Readings from Last 3 Encounters:  12/06/20 (!) 340 lb 3.2 oz (154.3 kg)  10/07/20 (!) 331 lb 3.2 oz (150.2 kg)  12/10/19 (!) 337 lb 12.8 oz (153.2 kg)    Past Medical History:  Diagnosis Date   Allergy    Hyperlipidemia    Hypertension    Obesity    Type II or unspecified type diabetes mellitus without mention of complication, not stated as uncontrolled      No Known  Allergies  Current Outpatient Medications on File Prior to Visit  Medication Sig   benzonatate (TESSALON) 200 MG capsule Take 1 perle 3 x / day to prevent cough   cetirizine (ZYRTEC) 10 MG tablet Take 10 mg by mouth daily as needed for allergies.    Cholecalciferol (VITAMIN D3) 1.25 MG (50000 UT) CAPS Take one tablet, three days a week for twelve weeks.   olmesartan-hydrochlorothiazide (BENICAR HCT) 40-25 MG tablet Take      1 tablet       Daily       for BP & Fluid Retention /Ankle Swelling   OVER THE COUNTER MEDICATION OTC acid reducer   rosuvastatin (CRESTOR) 40 MG tablet Take 1 tablet (40 mg total) by mouth daily.   No current facility-administered medications on file prior to visit.    ROS: all negative except above.   Physical Exam:  BP (!) 142/81   Pulse 86   Temp (!) 97.2 F (36.2 C)   Wt (!) 340 lb 3.2 oz (154.3 kg)   LMP 06/11/2014   SpO2 98%   BMI 55.75 kg/m   General Appearance: Well nourished, in no apparent distress. Eyes: PERRLA, EOMs, conjunctiva no swelling or erythema Sinuses: No Frontal/maxillary tenderness ENT/Mouth: Ext aud canals clear, Right TM erythematous, bulging.  Left TM no bulging or erythema No erythema, swelling, or exudate  on post pharynx.  Tonsils not swollen or erythematous. Hearing normal.  Neck: Supple, thyroid normal.  Respiratory: Respiratory effort normal, BS equal bilaterally without rales, rhonchi, wheezing or stridor.  Cardio: RRR with no MRGs. Brisk peripheral pulses without edema.  Abdomen: Soft, + BS.  Non tender, no guarding, rebound, hernias, masses. Lymphatics: Non tender without lymphadenopathy.  Musculoskeletal: Full ROM, 5/5 strength, normal gait.  Skin: Warm, dry without rashes, lesions, ecchymosis.  Neuro: Cranial nerves intact. Normal muscle tone, no cerebellar symptoms. Sensation intact.  Psych: Awake and oriented X 3, normal affect, Insight and Judgment appropriate.     Catherine Bernheim, NP 2:51 PM [facility]

## 2020-12-06 NOTE — Patient Instructions (Signed)

## 2020-12-08 ENCOUNTER — Encounter: Payer: No Typology Code available for payment source | Admitting: Nurse Practitioner

## 2020-12-28 ENCOUNTER — Encounter: Payer: Commercial Managed Care - PPO | Admitting: Nurse Practitioner

## 2021-01-25 NOTE — Progress Notes (Signed)
Complete Physical  Assessment and Plan:  [name] was seen today for annual exam.  Diagnoses and all orders for this visit:  Encounter for general adult medical examination with abnormal findings Due Yearly  Essential hypertension Continue current medications: Olmesartan/HCTZ 40mg -25mg  Monitor blood pressure at home; call if consistently over 130/80 Continue DASH diet.   Reminder to go to the ER if any CP, SOB, nausea, dizziness, severe HA, changes vision/speech, left arm numbness and tingling and jaw pain. -     CBC with Differential/Platelet -     EKG 12-Lead  Type 2 diabetes mellitus with stage 2 chronic kidney disease, without long-term current use of insulin (HCC) Continue diet, exercise and weight loss, If A1c remains above 7 will need to start medication -     COMPLETE METABOLIC PANEL WITH GFR -     Hemoglobin A1c -     Urinalysis, Routine w reflex microscopic -     Microalbumin / creatinine urine ratio  Type 2 diabetes mellitus with hyperlipidemia (HCC) Continue diet, exercise and medication -     COMPLETE METABOLIC PANEL WITH GFR -     Lipid panel -     TSH  Type 2 diabetes mellitus with obesity (HCC) Continue diet, exercise and weight loss,  CKD stage 2 due to type 2 diabetes mellitus (HCC) Increase fluids  Avoid NSAIDS Blood pressure control Monitor sugars  Will continue to monitor  Morbid obesity (HCC)/BMI 50.0-59.9, adult (HCC) Has lost 12 pounds in 2 months Continue diet and exercise   Vitamin D deficiency Continue supplementation to maintain goal of 60-100 Taking Vitamin D 50,000 IU twice a week -     VITAMIN D 25 Hydroxy (Vit-D Deficiency, Fractures)   Screening for blood or protein in urine -     Urinalysis, Routine w reflex microscopic -     Microalbumin / creatinine urine ratio  Screening for ischemic heart disease -     EKG 12-Lead  Medication management -     Magnesium  Chronic left shoulder pain -     DG Shoulder Left; Future Will  start with Xray but pain sounds like rotator cuff.  If xray is normal can proceed with MRI and ortho recommendation   Discussed med's effects and SE's. Screening labs and tests as requested with regular follow-up as recommended. Over 40 minutes of face to face interview, exam, counseling, chart review, and complex, high level critical decision making was performed this visit.   HPI  60 y.o. female  presents for a complete physical and follow up for has Prediabetes; Hypertension; Hyperlipidemia; Environmental allergies; Morbid obesity ([place]); History of colon polyps; Medication management; Vitamin D deficiency; and CKD (chronic kidney disease) stage 2, GFR 60-89 ml/min on their problem list..   She reports she works from home and is sedentary.  She is not able to do much walking due to pain. She is eating more fresh fruits, vegetables , fiber and drinking more water.  Avoiding soda and sweet tea.   BMI is Body mass index is 54.65 kg/m., she has been working on diet and exercise. Wt Readings from Last 3 Encounters:  01/27/21 (!) 328 lb 6.4 oz (149 kg)  12/06/20 (!) 340 lb 3.2 oz (154.3 kg)  10/07/20 (!) 331 lb 3.2 oz (150.2 kg)   Left shoulder has been causing pain for about 1 year. Describes the pain as sharp when she tries to reach her back.  Unable to lift anything heavy with that arm. Denies any injury  to the shoulder.   Her blood pressure has been controlled at home, today their BP is BP: 140/86 She does workout. She denies chest pain, shortness of breath, dizziness.   She is on cholesterol medication and denies myalgias. Her cholesterol is not at goal. The cholesterol last visit was:   Lab Results  Component Value Date   CHOL 157 10/07/2020   HDL 51 10/07/2020   LDLCALC 86 10/07/2020   TRIG 100 10/07/2020   CHOLHDL 3.1 10/07/2020     She has been working on diet and exercise for prediabetes, she is not on bASA, she is on ACE/ARB and denies nausea, paresthesia of the feet,  polydipsia, polyuria, visual disturbances, vomiting and weight loss. Last A1C in the office was:  Lab Results  Component Value Date   HGBA1C 7.5 (H) 10/07/2020     Last GFR: Lab Results  Component Value Date   GFRNONAA 81 02/18/2018   Lab Results  Component Value Date   GFRAA 93 02/18/2018    Patient is on Vitamin D supplement for deficiency.   Lab Results  Component Value Date   VD25OH 25 (L) 02/18/2018      Current Medications:  Current Outpatient Medications on File Prior to Visit  Medication Sig Dispense Refill   cetirizine (ZYRTEC) 10 MG tablet Take 10 mg by mouth daily as needed for allergies.      Cholecalciferol (VITAMIN D3) 1.25 MG (50000 UT) CAPS Take one tablet, three days a week for twelve weeks. 36 capsule 0   olmesartan-hydrochlorothiazide (BENICAR HCT) 40-25 MG tablet Take      1 tablet       Daily       for BP & Fluid Retention /Ankle Swelling 90 tablet 3   OVER THE COUNTER MEDICATION OTC acid reducer     rosuvastatin (CRESTOR) 40 MG tablet Take 1 tablet (40 mg total) by mouth daily. 90 tablet 1   No current facility-administered medications on file prior to visit.   Allergies:  No Known Allergies Medical History:  She has Prediabetes; Hypertension; Hyperlipidemia; Environmental allergies; Morbid obesity ([place]); History of colon polyps; Medication management; Vitamin D deficiency; and CKD (chronic kidney disease) stage 2, GFR 60-89 ml/min on their problem list. Health Maintenance:   Immunization History  Administered Date(s) Administered   Influenza Inj Mdck Quad With Preservative 01/11/2019, 01/11/2019   Influenza,inj,Quad PF,6+ Mos 02/26/2018, 12/18/2020   Influenza,inj,quad, With Preservative 12/26/2015   Influenza-Unspecified 01/28/2014, 01/11/2020   PFIZER(Purple Top)SARS-COV-2 Vaccination 03/07/2020   PPD Test 08/19/2013   Td 09/06/2003   Tdap 08/19/2013    Tetanus: 2015 Pneumovax: N/A Prevnar 13: N/A Flu vaccine: 12/2020 Shingrix: Discussed  with patient LMP: postmenopausal Pap: 04/2018 MGM: 05/2020 - [facility] DEXA: N/A Colonoscopy: 2015, due 2025 SARS-COV2 - Complete 06/2019  Last Dental Exam: Dr Gerlene Burdock 2022 +  Last Eye Exam: Due for 2022  Patient Care Team: Unk Pinto, MD as PCP - General (Internal Medicine) Molli Posey, MD as Consulting Physician (Obstetrics and Gynecology)  Surgical History:  She has a past surgical history that includes Cesarean section; Umbilical hernia repair; Tubal ligation; and Colonoscopy with propofol (N/A, 12/15/2013). Family History:  Herfamily history includes Alcohol abuse in her mother; Cancer in her father; Cancer (age of onset: 65) in her sister; Diabetes in her paternal grandmother and son; Hypertension in her mother. Social History:  She reports that she has never smoked. She has never used smokeless tobacco. She reports that she does not drink alcohol and  does not use drugs.  Review of Systems: Review of Systems  Constitutional:  Negative for chills and fever.  HENT:  Negative for congestion, hearing loss, sinus pain, sore throat and tinnitus.   Eyes:  Negative for blurred vision and double vision.  Respiratory:  Negative for cough, hemoptysis, sputum production, shortness of breath and wheezing.   Cardiovascular:  Negative for chest pain, palpitations and leg swelling.  Gastrointestinal:  Positive for diarrhea (more loose stools). Negative for abdominal pain, constipation, heartburn, nausea and vomiting.  Genitourinary:  Negative for dysuria and urgency.  Musculoskeletal:  Positive for joint pain (left shoulder). Negative for back pain, falls, myalgias and neck pain.  Skin:  Negative for rash.  Neurological:  Negative for dizziness, tingling, tremors, weakness and headaches.  Endo/Heme/Allergies:  Does not bruise/bleed easily.  Psychiatric/Behavioral:  Negative for depression and suicidal ideas. The patient is not nervous/anxious and does not have insomnia.     Physical Exam: Estimated body mass index is 54.65 kg/m as calculated from the following:   Height as of this encounter: 5\' 5"  (1.651 m).   Weight as of this encounter: 328 lb 6.4 oz (149 kg). BP 140/86   Pulse 84   Temp 97.9 F (36.6 C)   Resp 17   Ht 5\' 5"  (1.651 m)   Wt (!) 328 lb 6.4 oz (149 kg)   LMP 06/11/2014   SpO2 96%   BMI 54.65 kg/m  General Appearance: very pleasant morbidly obese female, in no apparent distress.  Eyes: PERRLA, EOMs, conjunctiva no swelling or erythema, normal fundi and vessels.  Sinuses: No Frontal/maxillary tenderness  ENT/Mouth: Ext aud canals clear, normal light reflex with TMs without erythema, bulging. Good dentition. No erythema, swelling, or exudate on post pharynx. Tonsils not swollen or erythematous. Hearing normal.  Neck: Supple, thyroid normal. No bruits  Respiratory: Respiratory effort normal, BS equal bilaterally without rales, rhonchi, wheezing or stridor.  Cardio: RRR without murmurs, rubs or gallops. Brisk peripheral pulses, +1 pitting edma bilaterally, right worse than left. Chest: symmetric, with normal excursions and percussion.  Breasts: defer Abdomen: Soft obese, nontender, no guarding, rebound, hernias, masses, or organomegaly.  Lymphatics: Non tender without lymphadenopathy.  Genitourinary: defer Musculoskeletal: Full ROM all peripheral extremities,5/5 strength, and normal gait. Tenderness when moving left  shoulder Skin: Warm, dry without rashes, lesions, ecchymosis. Neuro: Cranial nerves intact, reflexes equal bilaterally. Normal muscle tone, no cerebellar symptoms. Sensation intact.  Psych: Awake and oriented X 3, normal affect, Insight and Judgment appropriate.   EKG: NSR, no ST changes.   Magda Bernheim ANP-C  [facility]  01/27/2021

## 2021-01-27 ENCOUNTER — Other Ambulatory Visit: Payer: Self-pay

## 2021-01-27 ENCOUNTER — Encounter: Payer: Self-pay | Admitting: Nurse Practitioner

## 2021-01-27 ENCOUNTER — Ambulatory Visit (INDEPENDENT_AMBULATORY_CARE_PROVIDER_SITE_OTHER): Payer: Commercial Managed Care - PPO | Admitting: Nurse Practitioner

## 2021-01-27 VITALS — BP 140/86 | HR 84 | Temp 97.9°F | Resp 17 | Ht 65.0 in | Wt 328.4 lb

## 2021-01-27 DIAGNOSIS — Z0000 Encounter for general adult medical examination without abnormal findings: Secondary | ICD-10-CM

## 2021-01-27 DIAGNOSIS — Z136 Encounter for screening for cardiovascular disorders: Secondary | ICD-10-CM | POA: Diagnosis not present

## 2021-01-27 DIAGNOSIS — G8929 Other chronic pain: Secondary | ICD-10-CM

## 2021-01-27 DIAGNOSIS — Z0001 Encounter for general adult medical examination with abnormal findings: Secondary | ICD-10-CM

## 2021-01-27 DIAGNOSIS — Z79899 Other long term (current) drug therapy: Secondary | ICD-10-CM

## 2021-01-27 DIAGNOSIS — Z9109 Other allergy status, other than to drugs and biological substances: Secondary | ICD-10-CM

## 2021-01-27 DIAGNOSIS — E1122 Type 2 diabetes mellitus with diabetic chronic kidney disease: Secondary | ICD-10-CM

## 2021-01-27 DIAGNOSIS — E1169 Type 2 diabetes mellitus with other specified complication: Secondary | ICD-10-CM

## 2021-01-27 DIAGNOSIS — E669 Obesity, unspecified: Secondary | ICD-10-CM

## 2021-01-27 DIAGNOSIS — E559 Vitamin D deficiency, unspecified: Secondary | ICD-10-CM

## 2021-01-27 DIAGNOSIS — N182 Chronic kidney disease, stage 2 (mild): Secondary | ICD-10-CM

## 2021-01-27 DIAGNOSIS — Z6841 Body Mass Index (BMI) 40.0 and over, adult: Secondary | ICD-10-CM

## 2021-01-27 DIAGNOSIS — Z1389 Encounter for screening for other disorder: Secondary | ICD-10-CM

## 2021-01-27 DIAGNOSIS — I10 Essential (primary) hypertension: Secondary | ICD-10-CM

## 2021-01-27 DIAGNOSIS — G2581 Restless legs syndrome: Secondary | ICD-10-CM

## 2021-01-27 DIAGNOSIS — M25512 Pain in left shoulder: Secondary | ICD-10-CM

## 2021-01-27 NOTE — Patient Instructions (Signed)

## 2021-01-28 LAB — COMPLETE METABOLIC PANEL WITH GFR
AG Ratio: 1.5 (calc) (ref 1.0–2.5)
ALT: 37 U/L — ABNORMAL HIGH (ref 6–29)
AST: 42 U/L — ABNORMAL HIGH (ref 10–35)
Albumin: 4.3 g/dL (ref 3.6–5.1)
Alkaline phosphatase (APISO): 39 U/L (ref 37–153)
BUN/Creatinine Ratio: 13 (calc) (ref 6–22)
BUN: 13 mg/dL (ref 7–25)
CO2: 32 mmol/L (ref 20–32)
Calcium: 10 mg/dL (ref 8.6–10.4)
Chloride: 99 mmol/L (ref 98–110)
Creat: 1.04 mg/dL — ABNORMAL HIGH (ref 0.50–1.03)
Globulin: 2.8 g/dL (calc) (ref 1.9–3.7)
Glucose, Bld: 119 mg/dL — ABNORMAL HIGH (ref 65–99)
Potassium: 4.2 mmol/L (ref 3.5–5.3)
Sodium: 141 mmol/L (ref 135–146)
Total Bilirubin: 0.4 mg/dL (ref 0.2–1.2)
Total Protein: 7.1 g/dL (ref 6.1–8.1)
eGFR: 63 mL/min/{1.73_m2} (ref >=60)

## 2021-01-28 LAB — CBC WITH DIFFERENTIAL/PLATELET
Absolute Monocytes: 566 cells/uL (ref 200–950)
Basophils Absolute: 31 cells/uL (ref 0–200)
Basophils Relative: 0.6 %
Eosinophils Absolute: 82 cells/uL (ref 15–500)
Eosinophils Relative: 1.6 %
HCT: 45.2 % — ABNORMAL HIGH (ref 35.0–45.0)
Hemoglobin: 14.4 g/dL (ref 11.7–15.5)
Lymphs Abs: 2152 cells/uL (ref 850–3900)
MCH: 27.3 pg (ref 27.0–33.0)
MCHC: 31.9 g/dL — ABNORMAL LOW (ref 32.0–36.0)
MCV: 85.6 fL (ref 80.0–100.0)
MPV: 11 fL (ref 7.5–12.5)
Monocytes Relative: 11.1 %
Neutro Abs: 2270 cells/uL (ref 1500–7800)
Neutrophils Relative %: 44.5 %
Platelets: 320 10*3/uL (ref 140–400)
RBC: 5.28 10*6/uL — ABNORMAL HIGH (ref 3.80–5.10)
RDW: 14 % (ref 11.0–15.0)
Total Lymphocyte: 42.2 %
WBC: 5.1 10*3/uL (ref 3.8–10.8)

## 2021-01-28 LAB — HEMOGLOBIN A1C
Hgb A1c MFr Bld: 6.8 % of total Hgb — ABNORMAL HIGH (ref <5.7)
Mean Plasma Glucose: 148 mg/dL
eAG (mmol/L): 8.2 mmol/L

## 2021-01-28 LAB — URINALYSIS, ROUTINE W REFLEX MICROSCOPIC
Bilirubin Urine: NEGATIVE
Glucose, UA: NEGATIVE
Hgb urine dipstick: NEGATIVE
Ketones, ur: NEGATIVE
Leukocytes,Ua: NEGATIVE
Nitrite: NEGATIVE
Protein, ur: NEGATIVE
Specific Gravity, Urine: 1.008 (ref 1.001–1.035)
pH: 5.5 (ref 5.0–8.0)

## 2021-01-28 LAB — LIPID PANEL
Cholesterol: 182 mg/dL (ref <200)
HDL: 57 mg/dL (ref >=50)
LDL Cholesterol (Calc): 104 mg/dL (calc) — ABNORMAL HIGH
Non-HDL Cholesterol (Calc): 125 mg/dL (calc) (ref <130)
Total CHOL/HDL Ratio: 3.2 (calc) (ref <5.0)
Triglycerides: 113 mg/dL (ref <150)

## 2021-01-28 LAB — MAGNESIUM: Magnesium: 2 mg/dL (ref 1.5–2.5)

## 2021-01-28 LAB — VITAMIN D 25 HYDROXY (VIT D DEFICIENCY, FRACTURES): Vit D, 25-Hydroxy: 49 ng/mL (ref 30–100)

## 2021-01-28 LAB — MICROALBUMIN / CREATININE URINE RATIO
Creatinine, Urine: 55 mg/dL (ref 20–275)
Microalb, Ur: <0.2 mg/dL

## 2021-01-28 LAB — TSH: TSH: 2.27 mIU/L (ref 0.40–4.50)

## 2021-02-01 ENCOUNTER — Ambulatory Visit
Admission: RE | Admit: 2021-02-01 | Discharge: 2021-02-01 | Disposition: A | Discharge status: Home / Self Care | Payer: Commercial Managed Care - PPO | Source: Ambulatory Visit | Attending: Nurse Practitioner | Admitting: Nurse Practitioner

## 2021-02-01 ENCOUNTER — Other Ambulatory Visit: Payer: Self-pay

## 2021-02-01 DIAGNOSIS — G8929 Other chronic pain: Secondary | ICD-10-CM

## 2021-02-01 DIAGNOSIS — M25512 Pain in left shoulder: Secondary | ICD-10-CM

## 2021-02-06 ENCOUNTER — Other Ambulatory Visit: Payer: Self-pay | Admitting: Nurse Practitioner

## 2021-02-06 DIAGNOSIS — M25512 Pain in left shoulder: Secondary | ICD-10-CM

## 2021-02-22 ENCOUNTER — Encounter: Payer: Self-pay | Admitting: Nurse Practitioner

## 2021-07-28 ENCOUNTER — Ambulatory Visit: Payer: No Typology Code available for payment source | Admitting: Nurse Practitioner

## 2021-09-01 ENCOUNTER — Other Ambulatory Visit: Payer: Self-pay | Admitting: Internal Medicine

## 2021-09-21 NOTE — Progress Notes (Signed)
6 MONTH FOLLOW UP  Assessment and Plan:  Keeanna was seen today for acute visit and shoulder pain.  Diagnoses and all orders for this visit:  Essential hypertension -     CBC with Differential/Platelet       -  continue medications, DASH diet, exercise and monitor at home. Call if greater than 130/80.    Type 2 Diabetes Mellitus with hyperlipidemia -     COMPLETE METABOLIC PANEL WITH GFR -     Lipid panel -     TSH       - Continue medications:        - Continue diet and exercise.   Type 2 Diabetes with Morbid Obesity - Work on diet, exercise and weight loss - Started on Ozempic 0.25 mg initially then increase to 0.5 mg Weekly  Type 2 diabetes Mellitus without long term insulin use with stage 2 CKD      - Started on Ozempic 0.25 mg initially then increase to 0.5 mg Weekly       - Continue diet and exercise.        - Perform daily foot/skin check, notify office of any concerning changes.        - Check A1C   CKD stage 2 associated with Type 2 Diabetes Mellitus Increase fluids, avoid NSAIDS, monitor sugars, will monitor   Medication management -     CBC with Differential/Platelet -     COMPLETE METABOLIC PANEL WITH GFR -     Lipid panel -     TSH -     Hemoglobin A1c  Vitamin D deficiency -     VITAMIN D 25 Hydroxy (Vit-D Deficiency, Fractures) -     Cholecalciferol (VITAMIN D3) 1.25 MG (50000 UT) CAPS; Take one tablet, three days a week for twelve weeks.   Elevated LFT's - CMP - Work on diet, exercise and weight loss  Further disposition pending results of labs. Discussed med's effects and SE's.   Over 30 minutes of exam, counseling, chart review, and critical decision making was performed.   No future appointments.   ------------------------------------------------------------------------------------------------------------------   HPI BP (!) 148/80 (BP Location: Left Wrist)   Pulse 76   Temp 97.7 F (36.5 C)   Wt (!) 338 lb (153.3 kg)   LMP 06/11/2014    SpO2 97%   BMI 56.25 kg/m  61 y.o.female 6 month follow up has Prediabetes; Hypertension; Hyperlipidemia; Environmental allergies; Morbid obesity ([place]); History of colon polyps; Medication management; Vitamin D deficiency; and CKD (chronic kidney disease) stage 2, GFR 60-89 ml/min on their problem list.    BMI is Body mass index is 56.25 kg/m., she has been working on diet . Has not been exercising. She is trying to limit simple carbs  , will do sweets late night Wt Readings from Last 3 Encounters:  09/25/21 (!) 338 lb (153.3 kg)  01/27/21 (!) 328 lb 6.4 oz (149 kg)  12/06/20 (!) 340 lb 3.2 oz (154.3 kg)   Pt has hypertension and blood pressures have been controlled on Benicar HCT 40/25. She denies headaches, chest pain, shortness of breath and dizziness. BP's have been running  130-140/80's BP Readings from Last 3 Encounters:  09/25/21 (!) 148/80  01/27/21 140/86  12/06/20 (!) 142/81    Pt is on Rosuvastatin 40 mg QD for cholesterol.  Denies myalgias.  Her Cholesterol is not at goal, LDL still above 70 Lab Results  Component Value Date   CHOL 182 01/27/2021   HDL  57 01/27/2021   LDLCALC 104 (H) 01/27/2021   TRIG 113 01/27/2021   CHOLHDL 3.2 01/27/2021   Her blood sugars are not at goal and is not on any medication. Last A1C was  Lab Results  Component Value Date   HGBA1C 6.8 (H) 01/27/2021    Her last GFR was CKD stage 2 Lab Results  Component Value Date   GFRAA 78 12/10/2019    She is on Vit D supplementation 50000 units three days a week  Last vitamin D Lab Results  Component Value Date   VD25OH 49 01/27/2021     Lab Results  Component Value Date   TSH 2.27 01/27/2021     No Known Allergies  Current Outpatient Medications on File Prior to Visit  Medication Sig   cetirizine (ZYRTEC) 10 MG tablet Take 10 mg by mouth daily as needed for allergies.    Cholecalciferol (VITAMIN D3) 1.25 MG (50000 UT) CAPS Take one tablet, three days a week for twelve weeks.    olmesartan-hydrochlorothiazide (BENICAR HCT) 40-25 MG tablet TAKE 1 TABLET DAILY FOR BLOOD PRESSURE & FLUID RETENTION [name] SWELLING   OVER THE COUNTER MEDICATION OTC acid reducer   rosuvastatin (CRESTOR) 40 MG tablet Take 1 tablet (40 mg total) by mouth daily.   No current facility-administered medications on file prior to visit.   Review of Systems  Constitutional:  Negative for chills, fever and weight loss.  HENT:  Negative for congestion and hearing loss.   Eyes:  Negative for blurred vision and double vision.  Respiratory:  Negative for cough and shortness of breath.   Cardiovascular:  Negative for chest pain, palpitations, orthopnea and leg swelling.  Gastrointestinal:  Negative for abdominal pain, constipation, diarrhea, heartburn, nausea and vomiting.  Musculoskeletal:  Negative for falls, joint pain and myalgias.  Skin:  Negative for rash.  Neurological:  Negative for dizziness, tingling, tremors, loss of consciousness and headaches.  Psychiatric/Behavioral:  Negative for depression, memory loss and suicidal ideas.     Physical Exam:  BP (!) 148/80 (BP Location: Left Wrist)   Pulse 76   Temp 97.7 F (36.5 C)   Wt (!) 338 lb (153.3 kg)   LMP 06/11/2014   SpO2 97%   BMI 56.25 kg/m   General Appearance: Well nourished, in no apparent distress. Eyes: PERRLA, EOMs, conjunctiva no swelling or erythema Sinuses: No Frontal/maxillary tenderness ENT/Mouth: Ext aud canals clear, TMs without erythema, bulging. No erythema, swelling, or exudate on post pharynx.  Tonsils not swollen or erythematous. Hearing normal.  Neck: Supple, thyroid normal.  Respiratory: Respiratory effort normal, BS equal bilaterally without rales, rhonchi, wheezing or stridor.  Cardio: RRR with no MRGs. Brisk peripheral pulses without edema.  Abdomen: Soft, + BS.  Non tender, no guarding, rebound, hernias, masses. Lymphatics: Non tender without lymphadenopathy.  Musculoskeletal: Full ROM, 5/5 strength,  normal gait.  Skin: Warm, dry without rashes, lesions, ecchymosis.  Neuro: Cranial nerves intact. Normal muscle tone, no cerebellar symptoms. Sensation intact.  Psych: Awake and oriented X 3, normal affect, Insight and Judgment appropriate.     Alycia Rossetti, NP 10:05 AM [facility]

## 2021-09-25 ENCOUNTER — Telehealth: Payer: Self-pay

## 2021-09-25 ENCOUNTER — Encounter: Payer: Self-pay | Admitting: Nurse Practitioner

## 2021-09-25 ENCOUNTER — Ambulatory Visit (INDEPENDENT_AMBULATORY_CARE_PROVIDER_SITE_OTHER): Payer: No Typology Code available for payment source | Admitting: Nurse Practitioner

## 2021-09-25 VITALS — BP 148/80 | HR 76 | Temp 97.7°F | Wt 338.0 lb

## 2021-09-25 DIAGNOSIS — R7989 Other specified abnormal findings of blood chemistry: Secondary | ICD-10-CM

## 2021-09-25 DIAGNOSIS — E1169 Type 2 diabetes mellitus with other specified complication: Secondary | ICD-10-CM | POA: Diagnosis not present

## 2021-09-25 DIAGNOSIS — E1122 Type 2 diabetes mellitus with diabetic chronic kidney disease: Secondary | ICD-10-CM | POA: Diagnosis not present

## 2021-09-25 DIAGNOSIS — N182 Chronic kidney disease, stage 2 (mild): Secondary | ICD-10-CM

## 2021-09-25 DIAGNOSIS — E559 Vitamin D deficiency, unspecified: Secondary | ICD-10-CM

## 2021-09-25 DIAGNOSIS — I10 Essential (primary) hypertension: Secondary | ICD-10-CM

## 2021-09-25 DIAGNOSIS — Z79899 Other long term (current) drug therapy: Secondary | ICD-10-CM

## 2021-09-25 DIAGNOSIS — E785 Hyperlipidemia, unspecified: Secondary | ICD-10-CM

## 2021-09-25 DIAGNOSIS — E669 Obesity, unspecified: Secondary | ICD-10-CM

## 2021-09-25 MED ORDER — ROSUVASTATIN CALCIUM 40 MG PO TABS: 40.0000 mg | ORAL_TABLET | Freq: Every day | ORAL | 1 refills | Status: DC

## 2021-09-25 MED ORDER — SEMAGLUTIDE(0.25 OR 0.5MG/DOS) 2 MG/3ML ~~LOC~~ SOPN: 0.5000 mg | PEN_INJECTOR | SUBCUTANEOUS | 3 refills | Status: DC

## 2021-09-25 NOTE — Telephone Encounter (Signed)
Prior auth for [employer] approved.

## 2021-09-26 LAB — LIPID PANEL
Cholesterol: 194 mg/dL (ref <200)
HDL: 49 mg/dL — ABNORMAL LOW (ref >=50)
LDL Cholesterol (Calc): 120 mg/dL (calc) — ABNORMAL HIGH
Non-HDL Cholesterol (Calc): 145 mg/dL (calc) — ABNORMAL HIGH (ref <130)
Total CHOL/HDL Ratio: 4 (calc) (ref <5.0)
Triglycerides: 135 mg/dL (ref <150)

## 2021-09-26 LAB — CBC WITH DIFFERENTIAL/PLATELET
Absolute Monocytes: 408 cells/uL (ref 200–950)
Basophils Absolute: 29 cells/uL (ref 0–200)
Basophils Relative: 0.6 %
Eosinophils Absolute: 82 cells/uL (ref 15–500)
Eosinophils Relative: 1.7 %
HCT: 40.3 % (ref 35.0–45.0)
Hemoglobin: 13 g/dL (ref 11.7–15.5)
Lymphs Abs: 2813 cells/uL (ref 850–3900)
MCH: 28.3 pg (ref 27.0–33.0)
MCHC: 32.3 g/dL (ref 32.0–36.0)
MCV: 87.6 fL (ref 80.0–100.0)
MPV: 10.8 fL (ref 7.5–12.5)
Monocytes Relative: 8.5 %
Neutro Abs: 1469 cells/uL — ABNORMAL LOW (ref 1500–7800)
Neutrophils Relative %: 30.6 %
Platelets: 290 10*3/uL (ref 140–400)
RBC: 4.6 10*6/uL (ref 3.80–5.10)
RDW: 13.5 % (ref 11.0–15.0)
Total Lymphocyte: 58.6 %
WBC: 4.8 10*3/uL (ref 3.8–10.8)

## 2021-09-26 LAB — COMPLETE METABOLIC PANEL WITH GFR
AG Ratio: 1.8 (calc) (ref 1.0–2.5)
ALT: 30 U/L — ABNORMAL HIGH (ref 6–29)
AST: 32 U/L (ref 10–35)
Albumin: 4.3 g/dL (ref 3.6–5.1)
Alkaline phosphatase (APISO): 39 U/L (ref 37–153)
BUN: 13 mg/dL (ref 7–25)
CO2: 31 mmol/L (ref 20–32)
Calcium: 9.1 mg/dL (ref 8.6–10.4)
Chloride: 104 mmol/L (ref 98–110)
Creat: 0.87 mg/dL (ref 0.50–1.03)
Globulin: 2.4 g/dL (calc) (ref 1.9–3.7)
Glucose, Bld: 113 mg/dL — ABNORMAL HIGH (ref 65–99)
Potassium: 4.2 mmol/L (ref 3.5–5.3)
Sodium: 144 mmol/L (ref 135–146)
Total Bilirubin: 0.4 mg/dL (ref 0.2–1.2)
Total Protein: 6.7 g/dL (ref 6.1–8.1)
eGFR: 77 mL/min/{1.73_m2} (ref >=60)

## 2021-09-26 LAB — TSH: TSH: 1.8 mIU/L (ref 0.40–4.50)

## 2021-09-26 LAB — HEMOGLOBIN A1C
Hgb A1c MFr Bld: 6.5 % of total Hgb — ABNORMAL HIGH (ref <5.7)
Mean Plasma Glucose: 140 mg/dL
eAG (mmol/L): 7.7 mmol/L

## 2021-10-16 ENCOUNTER — Ambulatory Visit: Payer: No Typology Code available for payment source | Admitting: Nurse Practitioner

## 2021-10-16 ENCOUNTER — Encounter: Payer: Self-pay | Admitting: Nurse Practitioner

## 2021-10-16 DIAGNOSIS — R519 Headache, unspecified: Secondary | ICD-10-CM

## 2021-10-16 DIAGNOSIS — R0981 Nasal congestion: Secondary | ICD-10-CM | POA: Diagnosis not present

## 2021-10-16 DIAGNOSIS — U071 COVID-19: Secondary | ICD-10-CM | POA: Diagnosis not present

## 2021-10-16 DIAGNOSIS — R059 Cough, unspecified: Secondary | ICD-10-CM | POA: Diagnosis not present

## 2021-10-16 DIAGNOSIS — J029 Acute pharyngitis, unspecified: Secondary | ICD-10-CM

## 2021-10-16 DIAGNOSIS — R5383 Other fatigue: Secondary | ICD-10-CM

## 2021-10-16 MED ORDER — AZITHROMYCIN 250 MG PO TABS: ORAL_TABLET | ORAL | 0 refills | Status: AC

## 2021-10-16 MED ORDER — PREDNISONE 20 MG PO TABS: ORAL_TABLET | ORAL | 0 refills | Status: DC

## 2021-10-16 MED ORDER — BENZONATATE 100 MG PO CAPS: 100.0000 mg | ORAL_CAPSULE | Freq: Three times a day (TID) | ORAL | 1 refills | Status: DC | PRN

## 2021-10-16 NOTE — Progress Notes (Signed)
EPISODIC VISIT TELE-HEATH  Assessment & Plan:    1. COVID-19 Report to ER or call 911 for any increase in difficulty breathing Stay well hydrated to keep mucus thin and productive Suggested Zinc, Vitamin C. Continue daily vitamin D. Start Azithromycin, Steroid Taper, Tessalon Pearls for symptom control.  - azithromycin (ZITHROMAX) 250 MG tablet; Take 2 tablets (500 mg) on  Day 1,  followed by 1 tablet (250 mg) once daily on Days 2 through 5.  Dispense: 6 each; Refill: 0 - predniSONE (DELTASONE) 20 MG tablet; Take 2 tabs (40 mg) for 3 days followed by 1 tab (20 mg) for 4 days.  Dispense: 10 tablet; Refill: 0 - benzonatate (TESSALON PERLES) 100 MG capsule; Take 1 capsule (100 mg total) by mouth 3 (three) times daily as needed for cough.  Dispense: 30 capsule; Refill: 1  - azithromycin (ZITHROMAX) 250 MG tablet; Take 2 tablets (500 mg) on  Day 1,  followed by 1 tablet (250 mg) once daily on Days 2 through 5.  Dispense: 6 each; Refill: 0 - benzonatate (TESSALON PERLES) 100 MG capsule; Take 1 capsule (100 mg total) by mouth 3 (three) times daily as needed for cough.  Dispense: 30 capsule; Refill: 1  3. Nasal congestion Continue daily antihistamine  4. Acute intractable headache, unspecified headache type Tylenol OTC as directed as needed.  5. Sore throat Warm salt water gargles several times throughout the day.  6. Other fatigue Res   I discussed the assessment and treatment plan with the patient. The patient was provided an opportunity to ask questions and all were answered. The patient agreed with the plan and demonstrated an understanding of the instructions.   The patient was advised to call back or seek an in-person evaluation if the symptoms worsen or if the condition fails to improve as anticipated.  I provided 15 minutes of non-face-to-face time during this encounter.   Virtual telephone visit      Virtual Visit via Telephone Note   This visit type was conducted due to  national recommendations for restrictions regarding the COVID-19 Pandemic (e.g. social distancing) in an effort to limit this patient's exposure and mitigate transmission in our community. Due to her co-morbid illnesses, this patient is at least at moderate risk for complications without adequate follow up. This format is felt to be most appropriate for this patient at this time. The patient did not have access to video technology or had technical difficulties with video requiring transitioning to audio format only (telephone). Physical exam was limited to content and character of the telephone converstion.    Patient location: Home Provider location: Office    Patient: [name]   DOB: 04-Oct-1960   61 y.o. Female  MRN: [id number] Visit Date: 10/16/2021  Today's Provider: Darrol Jump, NP  Subjective:    Chief Complaint  Patient presents with   Covid Positive   HPI Subjective:     History was provided by the patient.  [name] is a 61 y.o. female who presents via telehealth for evaluation of symptoms of a URI, with a Covid + test this morning. Symptoms include low grade fevers, post nasal drip, sinus and nasal congestion, and sore throat. Onset of symptoms was 1 day ago, unchanged since that time. Associated symptoms include achiness.  She is drinking plenty of fluids. Evaluation to date: none. Treatment to date: none She denies any SOB, difficulty breathing.     Medications: Outpatient Medications Prior to Visit  Medication Sig  cetirizine (ZYRTEC) 10 MG tablet Take 10 mg by mouth daily as needed for allergies.    Cholecalciferol (VITAMIN D3) 1.25 MG (50000 UT) CAPS Take one tablet, three days a week for twelve weeks.   olmesartan-hydrochlorothiazide (BENICAR HCT) 40-25 MG tablet TAKE 1 TABLET DAILY FOR BLOOD PRESSURE & FLUID RETENTION [name] SWELLING   OVER THE COUNTER MEDICATION OTC acid reducer   rosuvastatin (CRESTOR) 40 MG tablet Take 1 tablet (40 mg total) by mouth  daily.   Semaglutide,0.25 or 0.5MG/DOS, 2 MG/3ML SOPN Inject 0.5 mg into the skin once a week.   No facility-administered medications prior to visit.   All review systems reviewed and negative except for pertinent positives in history of present illness.   Last CBC Lab Results  Component Value Date   WBC 4.8 09/25/2021   HGB 13.0 09/25/2021   HCT 40.3 09/25/2021   MCV 87.6 09/25/2021   MCH 28.3 09/25/2021   RDW 13.5 09/25/2021   PLT 290 69/67/8938   Last metabolic panel Lab Results  Component Value Date   GLUCOSE 113 (H) 09/25/2021   NA 144 09/25/2021   K 4.2 09/25/2021   CL 104 09/25/2021   CO2 31 09/25/2021   BUN 13 09/25/2021   CREATININE 0.87 09/25/2021   EGFR 77 09/25/2021   CALCIUM 9.1 09/25/2021   PROT 6.7 09/25/2021   ALBUMIN 4.2 09/25/2016   BILITOT 0.4 09/25/2021   ALKPHOS 51 09/25/2016   AST 32 09/25/2021   ALT 30 (H) 09/25/2021        Objective:    LMP 06/11/2014  BP Readings from Last 3 Encounters:  09/25/21 (!) 148/80  01/27/21 140/86  12/06/20 (!) 142/81       Darrol Jump, NP  [facility] [phone] (phone) [phone] (fax)

## 2021-11-07 ENCOUNTER — Encounter: Payer: Self-pay | Admitting: Nurse Practitioner

## 2021-12-16 IMAGING — CR DG SHOULDER 2+V*L*
3 series · 3 of 3 positions shown · non-contrast
Comparison: None.

CLINICAL DATA: Chronic left shoulder pain, limited range of motion
for 2 months

EXAM:
LEFT SHOULDER - 2+ VIEW

[w shoulder grashey left]
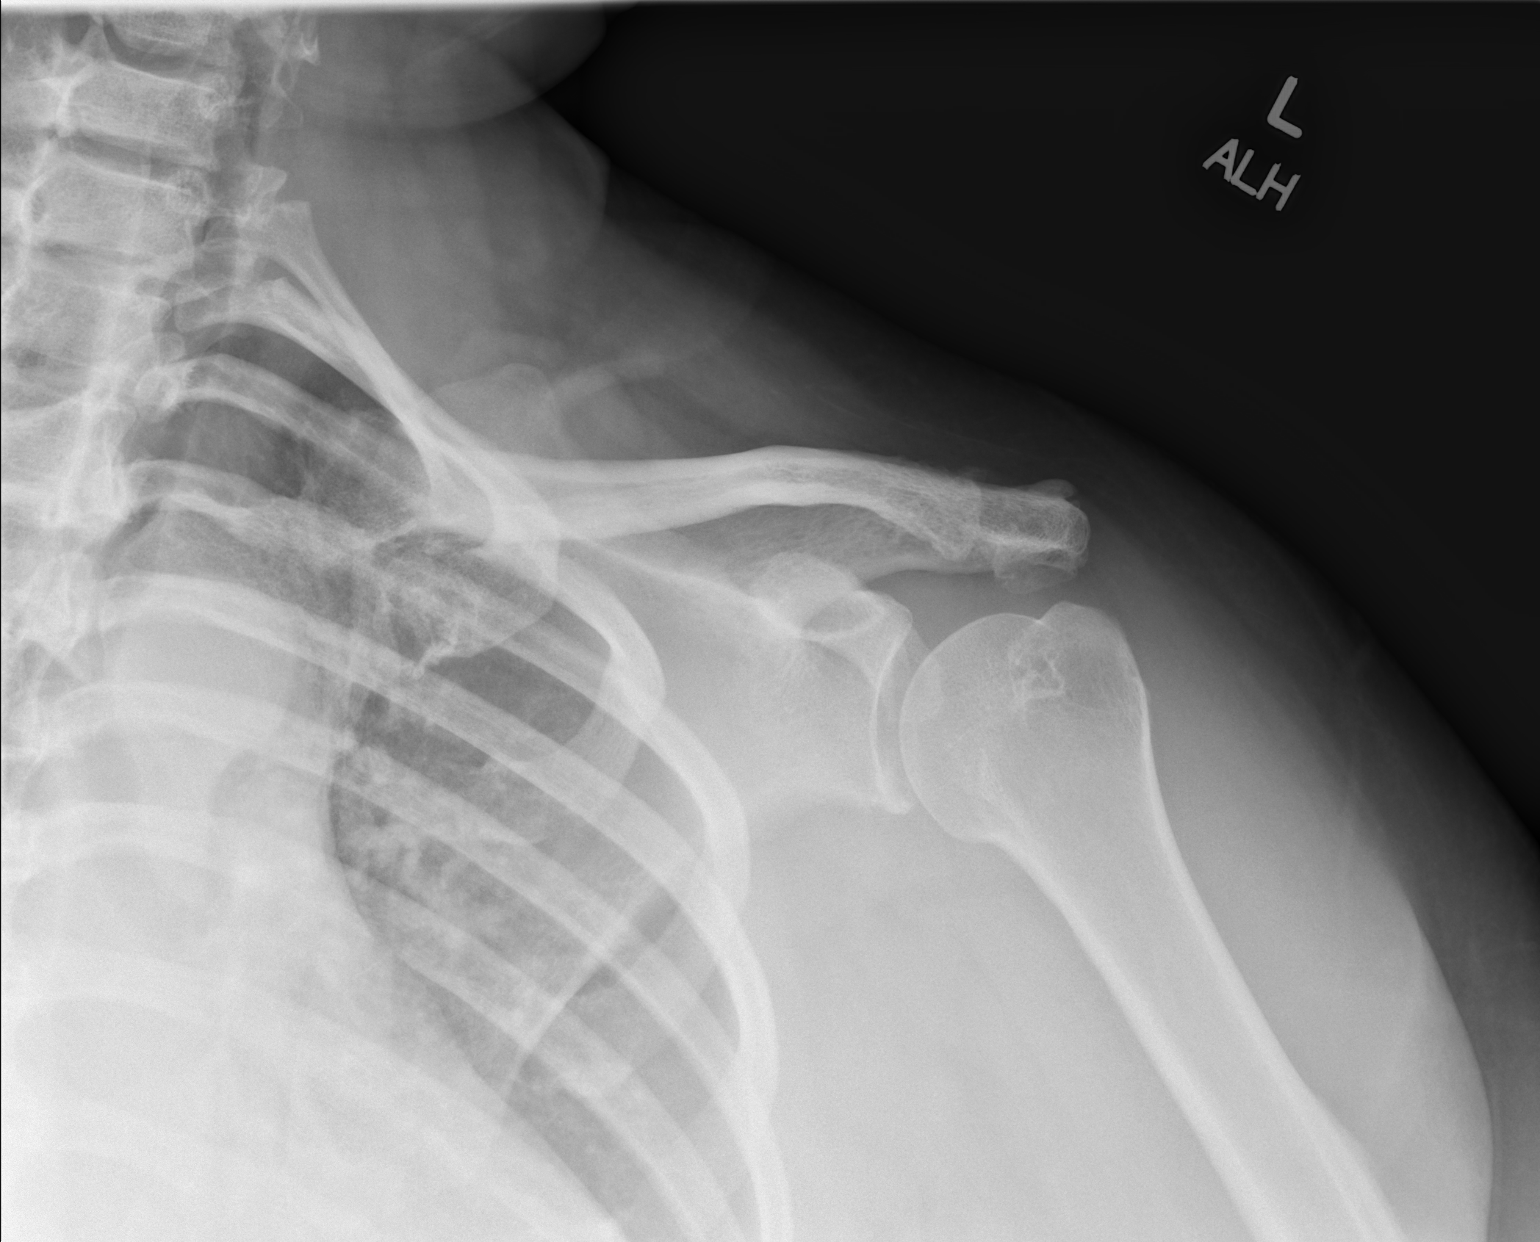

[w shoulder y-view left]
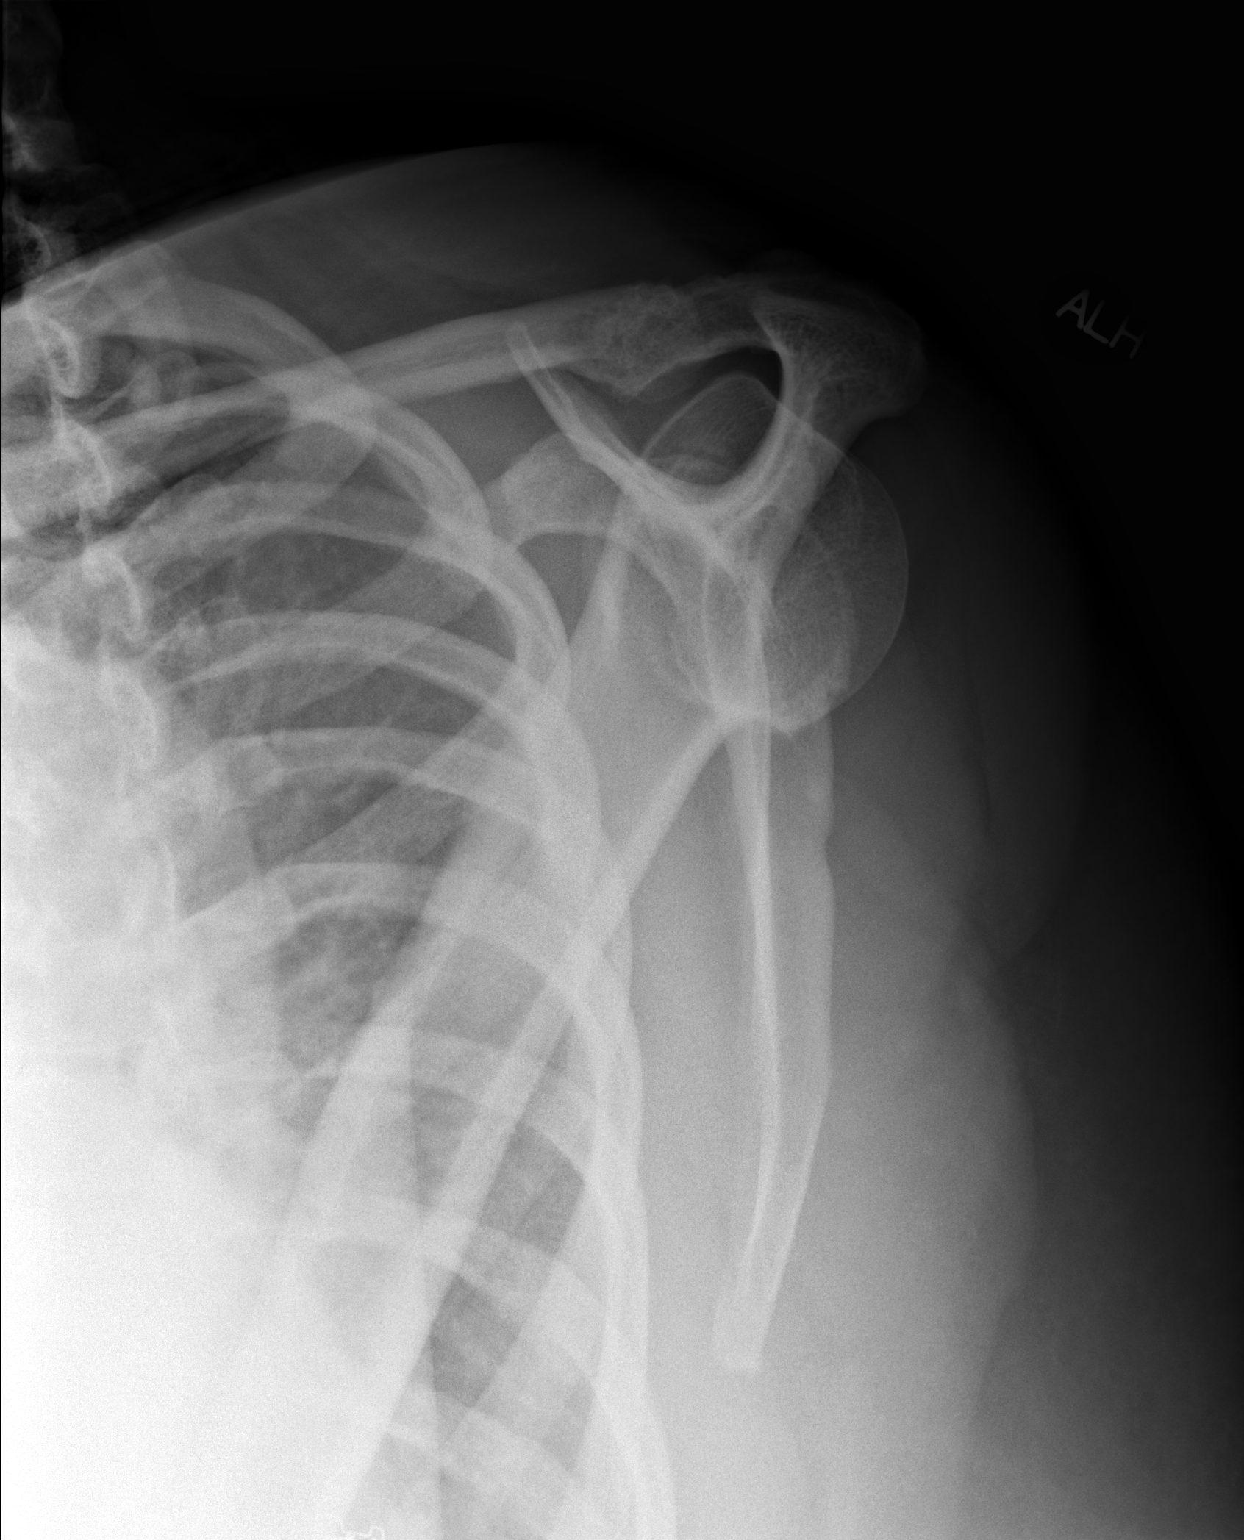

[w shoulder axillary left]
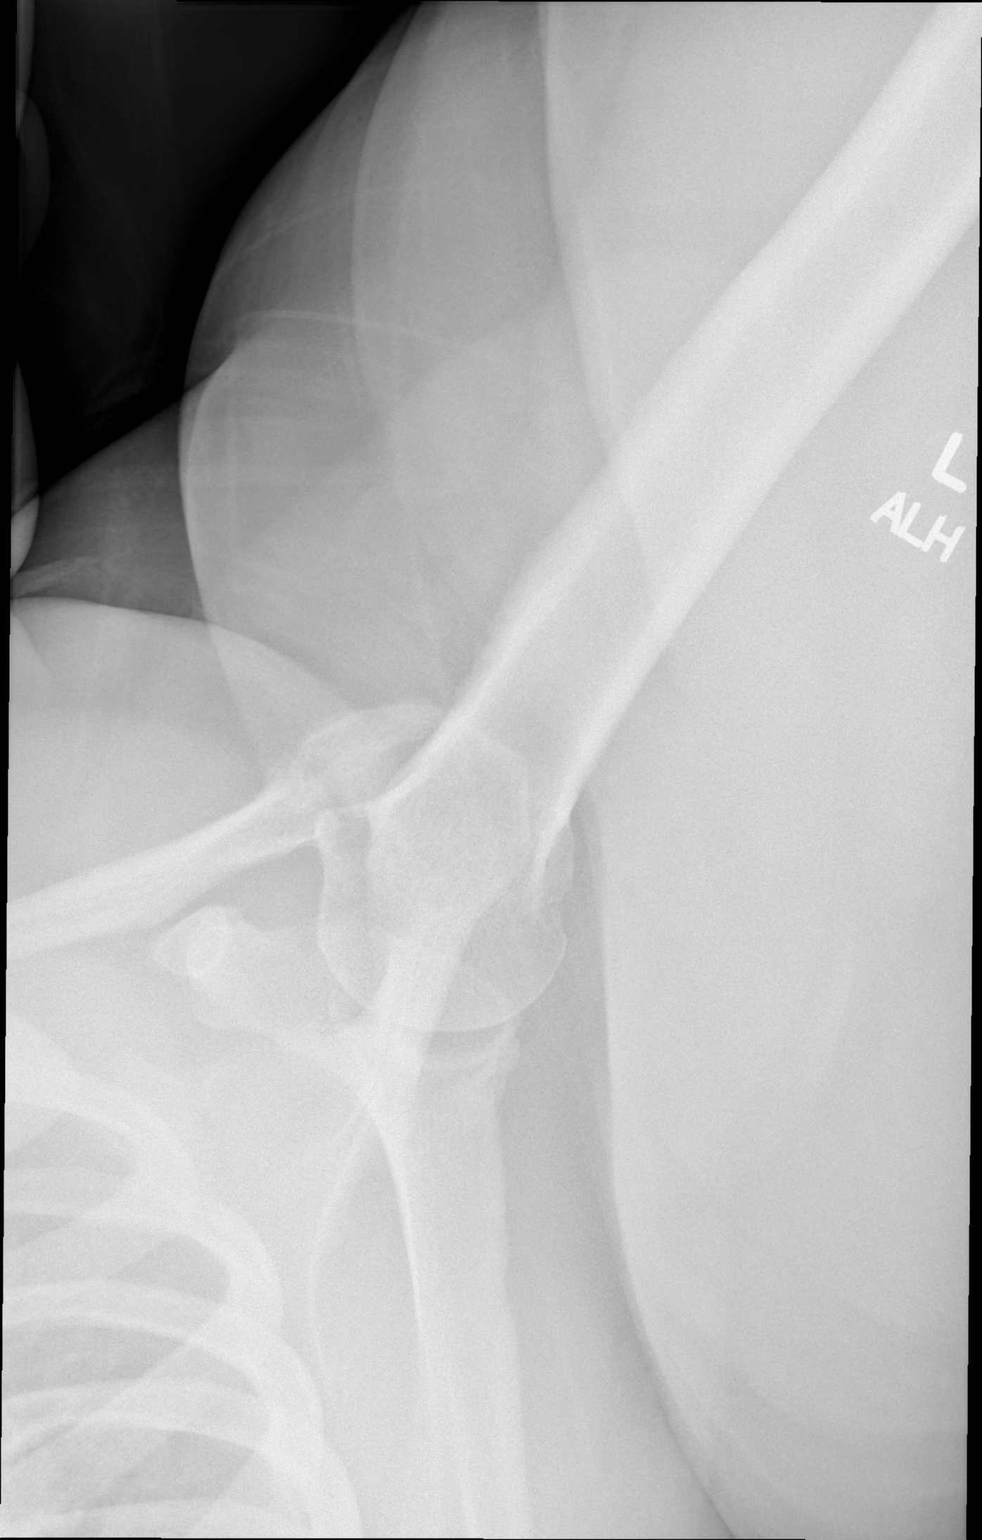

[3 of 3 positions shown; findings below may reference images not displayed]

FINDINGS: Frontal, transscapular, and axillary views of the left shoulder are
obtained. No fracture, subluxation, or dislocation. Marked spurring
of the undersurface of the acromion process. Mild degenerative
changes of the acromioclavicular and glenohumeral joints. Left chest
is clear.
IMPRESSION: 1. Degenerative changes of the acromioclavicular and glenohumeral
joints.
2. Prominent spurring undersurface of the acromion process. Please
correlate for signs of impingement syndrome.
3. No acute bony abnormality.

## 2022-01-25 NOTE — Progress Notes (Deleted)
Complete Physical  Assessment and Plan:  [name] was seen today for annual exam.  Diagnoses and all orders for this visit:  Encounter for general adult medical examination with abnormal findings Due Yearly  Essential hypertension Continue current medications: Olmesartan/HCTZ '40mg'$ -'25mg'$  Monitor blood pressure at home; call if consistently over 130/80 Continue DASH diet.   Reminder to go to the ER if any CP, SOB, nausea, dizziness, severe HA, changes vision/speech, left arm numbness and tingling and jaw pain. -     CBC with Differential/Platelet -     EKG 12-Lead  Type 2 diabetes mellitus with stage 2 chronic kidney disease, without long-term current use of insulin (HCC) Continue diet, exercise and weight loss, If A1c remains above 7 will need to start medication -     COMPLETE METABOLIC PANEL WITH GFR -     Hemoglobin A1c -     Urinalysis, Routine w reflex microscopic -     Microalbumin / creatinine urine ratio  Type 2 diabetes mellitus with hyperlipidemia (HCC) Continue diet, exercise and medication -     COMPLETE METABOLIC PANEL WITH GFR -     Lipid panel -     TSH  Type 2 diabetes mellitus with obesity (HCC) Continue diet, exercise and weight loss,  CKD stage 2 due to type 2 diabetes mellitus (HCC) Increase fluids  Avoid NSAIDS Blood pressure control Monitor sugars  Will continue to monitor  Morbid obesity (HCC)/BMI 50.0-59.9, adult (HCC) Has lost 12 pounds in 2 months Continue diet and exercise   Vitamin D deficiency Continue supplementation to maintain goal of 60-100 Taking Vitamin D 50,000 IU twice a week -     VITAMIN D 25 Hydroxy (Vit-D Deficiency, Fractures)   Screening for blood or protein in urine -     Urinalysis, Routine w reflex microscopic -     Microalbumin / creatinine urine ratio  Screening for ischemic heart disease -     EKG 12-Lead  Medication management -     Magnesium  Chronic left shoulder pain -     DG Shoulder Left; Future Will  start with Xray but pain sounds like rotator cuff.  If xray is normal can proceed with MRI and ortho recommendation   Discussed med's effects and SE's. Screening labs and tests as requested with regular follow-up as recommended. Over 40 minutes of face to face interview, exam, counseling, chart review, and complex, high level critical decision making was performed this visit.   HPI  61 y.o. female  presents for a complete physical and follow up for has Prediabetes; Hypertension; Hyperlipidemia; Environmental allergies; Morbid obesity ([place]); History of colon polyps; Medication management; Vitamin D deficiency; and CKD (chronic kidney disease) stage 2, GFR 60-89 ml/min on their problem list..   She reports she works from home and is sedentary.  She is not able to do much walking due to pain. She is eating more fresh fruits, vegetables , fiber and drinking more water.  Avoiding soda and sweet tea.   BMI is There is no height or weight on file to calculate BMI., she has been working on diet and exercise. Wt Readings from Last 3 Encounters:  09/25/21 (!) 338 lb (153.3 kg)  01/27/21 (!) 328 lb 6.4 oz (149 kg)  12/06/20 (!) 340 lb 3.2 oz (154.3 kg)   Left shoulder has been causing pain for about 1 year. Describes the pain as sharp when she tries to reach her back.  Unable to lift anything heavy with that arm.  Denies any injury to the shoulder.   Her blood pressure has been controlled at home, today their BP is   She does workout. She denies chest pain, shortness of breath, dizziness.   She is on cholesterol medication and denies myalgias. Her cholesterol is not at goal. The cholesterol last visit was:   Lab Results  Component Value Date   CHOL 194 09/25/2021   HDL 49 (L) 09/25/2021   LDLCALC 120 (H) 09/25/2021   TRIG 135 09/25/2021   CHOLHDL 4.0 09/25/2021     She has been working on diet and exercise for prediabetes, she is not on bASA, she is on ACE/ARB and denies nausea, paresthesia of  the feet, polydipsia, polyuria, visual disturbances, vomiting and weight loss. Last A1C in the office was:  Lab Results  Component Value Date   HGBA1C 6.5 (H) 09/25/2021     Last GFR: Lab Results  Component Value Date   GFRNONAA 81 02/18/2018   Lab Results  Component Value Date   GFRAA 93 02/18/2018    Patient is on Vitamin D supplement for deficiency.   Lab Results  Component Value Date   VD25OH 25 (L) 02/18/2018      Current Medications:  Current Outpatient Medications on File Prior to Visit  Medication Sig Dispense Refill   benzonatate (TESSALON PERLES) 100 MG capsule Take 1 capsule (100 mg total) by mouth 3 (three) times daily as needed for cough. 30 capsule 1   cetirizine (ZYRTEC) 10 MG tablet Take 10 mg by mouth daily as needed for allergies.      Cholecalciferol (VITAMIN D3) 1.25 MG (50000 UT) CAPS Take one tablet, three days a week for twelve weeks. 36 capsule 0   olmesartan-hydrochlorothiazide (BENICAR HCT) 40-25 MG tablet TAKE 1 TABLET DAILY FOR BLOOD PRESSURE & FLUID RETENTION [name] SWELLING 90 tablet 3   OVER THE COUNTER MEDICATION OTC acid reducer     predniSONE (DELTASONE) 20 MG tablet Take 2 tabs (40 mg) for 3 days followed by 1 tab (20 mg) for 4 days. 10 tablet 0   rosuvastatin (CRESTOR) 40 MG tablet Take 1 tablet (40 mg total) by mouth daily. 90 tablet 1   Semaglutide,0.25 or 0.'5MG'$ /DOS, 2 MG/3ML SOPN Inject 0.5 mg into the skin once a week. 3 mL 3   No current facility-administered medications on file prior to visit.   Allergies:  No Known Allergies Medical History:  She has Prediabetes; Hypertension; Hyperlipidemia; Environmental allergies; Morbid obesity ([place]); History of colon polyps; Medication management; Vitamin D deficiency; and CKD (chronic kidney disease) stage 2, GFR 60-89 ml/min on their problem list. Health Maintenance:   Immunization History  Administered Date(s) Administered   Influenza Inj Mdck Quad Pf 01/08/2022   Influenza Inj Mdck Quad  With Preservative 01/11/2019, 01/11/2019   Influenza,inj,Quad PF,6+ Mos 02/26/2018, 12/18/2020   Influenza,inj,quad, With Preservative 12/26/2015   Influenza-Unspecified 01/28/2014, 01/11/2020   PFIZER(Purple Top)SARS-COV-2 Vaccination 03/07/2020   PPD Test 08/19/2013   Td 09/06/2003   Tdap 08/19/2013    Tetanus: 2015 Pneumovax: N/A Prevnar 13: N/A Flu vaccine: 12/2020 Shingrix: Discussed with patient LMP: postmenopausal Pap: 04/2018 MGM: 05/2020 - [facility] DEXA: N/A Colonoscopy: 2015, due 2025 SARS-COV2 - Complete 06/2019  Last Dental Exam: Dr Gerlene Burdock 2022 +  Last Eye Exam: Due for 2022  Patient Care Team: Unk Pinto, MD as PCP - General (Internal Medicine) Molli Posey, MD as Consulting Physician (Obstetrics and Gynecology)  Surgical History:  She has a past surgical history that includes Cesarean  section; Umbilical hernia repair; Tubal ligation; and Colonoscopy with propofol (N/A, 12/15/2013). Family History:  Herfamily history includes Alcohol abuse in her mother; Cancer in her father; Cancer (age of onset: 70) in her sister; Diabetes in her paternal grandmother and son; Hypertension in her mother. Social History:  She reports that she has never smoked. She has never used smokeless tobacco. She reports that she does not drink alcohol and does not use drugs.  Review of Systems: Review of Systems  Constitutional:  Negative for chills and fever.  HENT:  Negative for congestion, hearing loss, sinus pain, sore throat and tinnitus.   Eyes:  Negative for blurred vision and double vision.  Respiratory:  Negative for cough, hemoptysis, sputum production, shortness of breath and wheezing.   Cardiovascular:  Negative for chest pain, palpitations and leg swelling.  Gastrointestinal:  Positive for diarrhea (more loose stools). Negative for abdominal pain, constipation, heartburn, nausea and vomiting.  Genitourinary:  Negative for dysuria and urgency.   Musculoskeletal:  Positive for joint pain (left shoulder). Negative for back pain, falls, myalgias and neck pain.  Skin:  Negative for rash.  Neurological:  Negative for dizziness, tingling, tremors, weakness and headaches.  Endo/Heme/Allergies:  Does not bruise/bleed easily.  Psychiatric/Behavioral:  Negative for depression and suicidal ideas. The patient is not nervous/anxious and does not have insomnia.     Physical Exam: Estimated body mass index is 56.25 kg/m as calculated from the following:   Height as of 01/27/21: '5\' 5"'$  (1.651 m).   Weight as of 09/25/21: 338 lb (153.3 kg). LMP 06/11/2014  General Appearance: very pleasant morbidly obese female, in no apparent distress.  Eyes: PERRLA, EOMs, conjunctiva no swelling or erythema, normal fundi and vessels.  Sinuses: No Frontal/maxillary tenderness  ENT/Mouth: Ext aud canals clear, normal light reflex with TMs without erythema, bulging. Good dentition. No erythema, swelling, or exudate on post pharynx. Tonsils not swollen or erythematous. Hearing normal.  Neck: Supple, thyroid normal. No bruits  Respiratory: Respiratory effort normal, BS equal bilaterally without rales, rhonchi, wheezing or stridor.  Cardio: RRR without murmurs, rubs or gallops. Brisk peripheral pulses, +1 pitting edma bilaterally, right worse than left. Chest: symmetric, with normal excursions and percussion.  Breasts: defer Abdomen: Soft obese, nontender, no guarding, rebound, hernias, masses, or organomegaly.  Lymphatics: Non tender without lymphadenopathy.  Genitourinary: defer Musculoskeletal: Full ROM all peripheral extremities,5/5 strength, and normal gait. Tenderness when moving left  shoulder Skin: Warm, dry without rashes, lesions, ecchymosis. Neuro: Cranial nerves intact, reflexes equal bilaterally. Normal muscle tone, no cerebellar symptoms. Sensation intact.  Psych: Awake and oriented X 3, normal affect, Insight and Judgment appropriate.   EKG: NSR, no  ST changes.   [name] ANP-C  [name]  01/25/2022

## 2022-01-29 ENCOUNTER — Encounter: Payer: No Typology Code available for payment source | Admitting: Nurse Practitioner

## 2022-01-29 DIAGNOSIS — E1122 Type 2 diabetes mellitus with diabetic chronic kidney disease: Secondary | ICD-10-CM

## 2022-01-29 DIAGNOSIS — Z79899 Other long term (current) drug therapy: Secondary | ICD-10-CM

## 2022-01-29 DIAGNOSIS — E1169 Type 2 diabetes mellitus with other specified complication: Secondary | ICD-10-CM

## 2022-01-29 DIAGNOSIS — E559 Vitamin D deficiency, unspecified: Secondary | ICD-10-CM

## 2022-01-29 DIAGNOSIS — Z1389 Encounter for screening for other disorder: Secondary | ICD-10-CM

## 2022-01-29 DIAGNOSIS — Z1329 Encounter for screening for other suspected endocrine disorder: Secondary | ICD-10-CM

## 2022-01-29 DIAGNOSIS — I10 Essential (primary) hypertension: Secondary | ICD-10-CM

## 2022-01-29 DIAGNOSIS — Z0001 Encounter for general adult medical examination with abnormal findings: Secondary | ICD-10-CM

## 2022-01-29 DIAGNOSIS — Z136 Encounter for screening for cardiovascular disorders: Secondary | ICD-10-CM

## 2022-01-29 DIAGNOSIS — R7989 Other specified abnormal findings of blood chemistry: Secondary | ICD-10-CM

## 2022-02-08 NOTE — Progress Notes (Signed)
Complete Physical  Assessment and Plan:  [name] was seen today for annual exam.  Diagnoses and all orders for this visit:  Encounter for general adult medical examination with abnormal findings Due Yearly  Essential hypertension Continue current medications: Olmesartan/HCTZ 34m-25mg Monitor blood pressure at home; call if consistently over 130/80 Continue DASH diet.   Reminder to go to the ER if any CP, SOB, nausea, dizziness, severe HA, changes vision/speech, left arm numbness and tingling and jaw pain. -     CBC with Differential/Platelet -     EKG 12-Lead  Type 2 diabetes mellitus with stage 2 chronic kidney disease, without long-term current use of insulin (HCC) Continue diet, exercise and weight loss, If A1c is 6.5 or greater will plan to increase Ozempic to 1 mg SQ QW -     COMPLETE METABOLIC PANEL WITH GFR -     Hemoglobin A1c -     Urinalysis, Routine w reflex microscopic -     Microalbumin / creatinine urine ratio  Type 2 diabetes mellitus with hyperlipidemia (HCC) Continue diet, exercise and medication If A1c is 6.5 or greater will plan to increase Ozempic to 1 mg SQ QW Encourage daily checking of blood sugar -     COMPLETE METABOLIC PANEL WITH GFR -     Lipid panel -     TSH  Type 2 diabetes mellitus with obesity (HCC) Continue diet, exercise and weight loss,  CKD stage 2 due to type 2 diabetes mellitus (HCC) Increase fluids  Avoid NSAIDS Blood pressure control Monitor sugars  Will continue to monitor  Morbid obesity (HCC)/BMI 50.0-59.9, adult (HCC) Has lost 12 pounds in 2 months Continue diet and exercise   Vitamin D deficiency Continue supplementation to maintain goal of 60-100 Taking Vitamin D 50,000 IU twice a week -     VITAMIN D 25 Hydroxy (Vit-D Deficiency, Fractures)  Elevated LFT'S Avoid Tylenol and Alcohol, continue diet and exercise - CMP  Screening for blood or protein in urine -     Urinalysis, Routine w reflex culture -      Microalbumin / creatinine urine ratio  Screening for ischemic heart disease -     EKG 12-Lead  Medication management -     Magnesium  Screening  for AAA - U/S ABD RETROPERITONEAL LTD  Screening for thyroid disorder - TSH     Discussed med's effects and SE's. Screening labs and tests as requested with regular follow-up as recommended. Over 40 minutes of face to face interview, exam, counseling, chart review, and complex, high level critical decision making was performed this visit.   HPI  61y.o. female  presents for a complete physical and follow up for has Prediabetes; Hypertension; Hyperlipidemia; Environmental allergies; Morbid obesity ([place]; History of colon polyps; Medication management; Vitamin D deficiency; and CKD (chronic kidney disease) stage 2, GFR 60-89 ml/min on their problem list..   BMI is Body mass index is 52.98 kg/m., she has been working on diet and exercise. She is on Semaglutide 0.5 mg once a week  Wt Readings from Last 3 Encounters:  02/12/22 (!) 318 lb 6.4 oz (144.4 kg)  09/25/21 (!) 338 lb (153.3 kg)  01/27/21 (!) 328 lb 6.4 oz (149 kg)   She is being followed for elevated LFT's- were improved last check 09/25/21.  She is currently on Olmesartan HCTZ 40/25 mg daily. Her blood pressure has been controlled at home, today their BP is BP: 130/82  BP Readings from Last 3 Encounters:  02/12/22  130/82  09/25/21 (!) 148/80  01/27/21 140/86    She does workout. She denies chest pain, shortness of breath, dizziness.   She is on cholesterol medication, Rosuvastatin 40 mg,  and denies myalgias. Her cholesterol is not at goal. The cholesterol last visit was:   Lab Results  Component Value Date   CHOL 194 09/25/2021   HDL 49 (L) 09/25/2021   LDLCALC 120 (H) 09/25/2021   TRIG 135 09/25/2021   CHOLHDL 4.0 09/25/2021     She has been working on diet and exercise for prediabetes, she is not on bASA, she is on ACE/ARB, she is on Semaglutide 0.5 mg QW and denies  nausea, paresthesia of the feet, polydipsia, polyuria, visual disturbances, vomiting and weight loss. She has not been checking her blood sugars  Last A1C in the office was:  Lab Results  Component Value Date   HGBA1C 6.5 (H) 09/25/2021     Last GFR: Lab Results  Component Value Date   EGFR 77 09/25/2021    Patient is on Vitamin D supplement 50000 units three times a week for deficiency.   Lab Results  Component Value Date   VD25OH 25 (L) 02/18/2018      Current Medications:  Current Outpatient Medications on File Prior to Visit  Medication Sig Dispense Refill   benzonatate (TESSALON PERLES) 100 MG capsule Take 1 capsule (100 mg total) by mouth 3 (three) times daily as needed for cough. 30 capsule 1   cetirizine (ZYRTEC) 10 MG tablet Take 10 mg by mouth daily as needed for allergies.      Cholecalciferol (VITAMIN D3) 1.25 MG (50000 UT) CAPS Take one tablet, three days a week for twelve weeks. 36 capsule 0   olmesartan-hydrochlorothiazide (BENICAR HCT) 40-25 MG tablet TAKE 1 TABLET DAILY FOR BLOOD PRESSURE & FLUID RETENTION [name] SWELLING 90 tablet 3   OVER THE COUNTER MEDICATION OTC acid reducer     rosuvastatin (CRESTOR) 40 MG tablet Take 1 tablet (40 mg total) by mouth daily. 90 tablet 1   Semaglutide,0.25 or 0.5MG/DOS, 2 MG/3ML SOPN Inject 0.5 mg into the skin once a week. 3 mL 3   No current facility-administered medications on file prior to visit.   Allergies:  No Known Allergies Medical History:  She has Prediabetes; Hypertension; Hyperlipidemia; Environmental allergies; Morbid obesity ([place]); History of colon polyps; Medication management; Vitamin D deficiency; and CKD (chronic kidney disease) stage 2, GFR 60-89 ml/min on their problem list. Health Maintenance:   Immunization History  Administered Date(s) Administered   Influenza Inj Mdck Quad Pf 01/08/2022   Influenza Inj Mdck Quad With Preservative 01/11/2019, 01/11/2019   Influenza,inj,Quad PF,6+ Mos 02/26/2018,  12/18/2020   Influenza,inj,quad, With Preservative 12/26/2015   Influenza-Unspecified 01/28/2014, 01/11/2020   PFIZER(Purple Top)SARS-COV-2 Vaccination 03/07/2020   PPD Test 08/19/2013   Td 09/06/2003   Tdap 08/19/2013    Tetanus: 2015 Pneumovax: N/A Prevnar 13: N/A Flu vaccine: 12/2020 Shingrix: Discussed with patient LMP: postmenopausal Pap: 04/2018 MGM: 05/2021 - [facility] DEXA: N/A Colonoscopy: 2015, due 2025 SARS-COV2 - Complete 06/2019  Last Dental Exam: Dr Gerlene Burdock 2022 Last Eye Exam:[facility], 11/2021  Patient Care Team: Unk Pinto, MD as PCP - General (Internal Medicine) Molli Posey, MD as Consulting Physician (Obstetrics and Gynecology)  Surgical History:  She has a past surgical history that includes Cesarean section; Umbilical hernia repair; Tubal ligation; and Colonoscopy with propofol (N/A, 12/15/2013). Family History:  Herfamily history includes Alcohol abuse in her mother; Cancer in her father; Cancer (  age of onset: 66) in her sister; Diabetes in her paternal grandmother and son; Hypertension in her mother. Social History:  She reports that she has never smoked. She has never used smokeless tobacco. She reports that she does not drink alcohol and does not use drugs.  Review of Systems: Review of Systems  Constitutional:  Negative for chills and fever.  HENT:  Negative for congestion, hearing loss, sinus pain, sore throat and tinnitus.   Eyes:  Negative for blurred vision and double vision.  Respiratory:  Negative for cough, hemoptysis, sputum production, shortness of breath and wheezing.   Cardiovascular:  Negative for chest pain, palpitations and leg swelling.  Gastrointestinal:  Negative for abdominal pain, constipation, heartburn, nausea and vomiting.  Genitourinary:  Negative for dysuria and urgency.  Musculoskeletal:  Negative for back pain, falls, joint pain, myalgias and neck pain.  Skin:  Negative for rash.  Neurological:   Negative for dizziness, tingling, tremors, weakness and headaches.  Endo/Heme/Allergies:  Does not bruise/bleed easily.  Psychiatric/Behavioral:  Negative for depression and suicidal ideas. The patient is not nervous/anxious and does not have insomnia.     Physical Exam: Estimated body mass index is 52.98 kg/m as calculated from the following:   Height as of this encounter: _0  (1.651 m).   Weight as of this encounter: 318 lb 6.4 oz (144.4 kg). BP 130/82   Pulse 73   Temp 97.6 F (36.4 C)   Resp 18   Ht _1  (1.651 m)   Wt (!) 318 lb 6.4 oz (144.4 kg)   LMP 06/11/2014   SpO2 96%   BMI 52.98 kg/m  General Appearance: very pleasant morbidly obese female, in no apparent distress.  Eyes: PERRLA, EOMs, conjunctiva no swelling or erythema, normal fundi and vessels.  Sinuses: No Frontal/maxillary tenderness  ENT/Mouth: Ext aud canals clear, normal light reflex with TMs without erythema, bulging. Good dentition. No erythema, swelling, or exudate on post pharynx. Tonsils not swollen or erythematous. Hearing normal.  Neck: Supple, thyroid normal. No bruits  Respiratory: Respiratory effort normal, BS equal bilaterally without rales, rhonchi, wheezing or stridor.  Cardio: RRR without murmurs, rubs or gallops. Brisk peripheral pulses, +1 pitting edma bilaterally, right worse than left. Chest: symmetric, with normal excursions and percussion.  Breasts: breasts appear normal, no suspicious masses, no skin or nipple changes or axillary nodes.  Abdomen: Soft obese, nontender, no guarding, rebound, difficult exam due to body habitus Lymphatics: Non tender without lymphadenopathy.  Genitourinary: defer Musculoskeletal: Full ROM all peripheral extremities,5/5 strength, and normal gait. Tenderness when moving left  shoulder Skin: Warm, dry without rashes, lesions, ecchymosis. Neuro: Cranial nerves intact, reflexes equal bilaterally. Normal muscle tone, no cerebellar symptoms. Sensation intact.   Psych: Awake and oriented X 3, normal affect, Insight and Judgment appropriate.   EKG: NSR, no ST changes. AAA: < 3 cm   [facility]  02/12/2022

## 2022-02-12 ENCOUNTER — Encounter: Payer: Self-pay | Admitting: Nurse Practitioner

## 2022-02-12 ENCOUNTER — Ambulatory Visit (INDEPENDENT_AMBULATORY_CARE_PROVIDER_SITE_OTHER): Payer: No Typology Code available for payment source | Admitting: Nurse Practitioner

## 2022-02-12 VITALS — BP 130/82 | HR 73 | Temp 97.6°F | Resp 18 | Ht 65.0 in | Wt 318.4 lb

## 2022-02-12 DIAGNOSIS — Z0001 Encounter for general adult medical examination with abnormal findings: Secondary | ICD-10-CM

## 2022-02-12 DIAGNOSIS — Z1329 Encounter for screening for other suspected endocrine disorder: Secondary | ICD-10-CM

## 2022-02-12 DIAGNOSIS — R7989 Other specified abnormal findings of blood chemistry: Secondary | ICD-10-CM

## 2022-02-12 DIAGNOSIS — I10 Essential (primary) hypertension: Secondary | ICD-10-CM | POA: Diagnosis not present

## 2022-02-12 DIAGNOSIS — I700 Atherosclerosis of aorta: Secondary | ICD-10-CM | POA: Diagnosis not present

## 2022-02-12 DIAGNOSIS — Z136 Encounter for screening for cardiovascular disorders: Secondary | ICD-10-CM | POA: Diagnosis not present

## 2022-02-12 DIAGNOSIS — Z79899 Other long term (current) drug therapy: Secondary | ICD-10-CM

## 2022-02-12 DIAGNOSIS — Z0000 Encounter for general adult medical examination without abnormal findings: Secondary | ICD-10-CM | POA: Diagnosis not present

## 2022-02-12 DIAGNOSIS — N182 Chronic kidney disease, stage 2 (mild): Secondary | ICD-10-CM

## 2022-02-12 DIAGNOSIS — Z1389 Encounter for screening for other disorder: Secondary | ICD-10-CM

## 2022-02-12 DIAGNOSIS — E1122 Type 2 diabetes mellitus with diabetic chronic kidney disease: Secondary | ICD-10-CM

## 2022-02-12 DIAGNOSIS — E559 Vitamin D deficiency, unspecified: Secondary | ICD-10-CM

## 2022-02-12 DIAGNOSIS — G8929 Other chronic pain: Secondary | ICD-10-CM

## 2022-02-12 DIAGNOSIS — E1169 Type 2 diabetes mellitus with other specified complication: Secondary | ICD-10-CM

## 2022-02-12 NOTE — Patient Instructions (Signed)

## 2022-02-13 ENCOUNTER — Other Ambulatory Visit: Payer: Self-pay | Admitting: Nurse Practitioner

## 2022-02-13 DIAGNOSIS — E1122 Type 2 diabetes mellitus with diabetic chronic kidney disease: Secondary | ICD-10-CM

## 2022-02-13 LAB — CBC WITH DIFFERENTIAL/PLATELET
Absolute Monocytes: 507 cells/uL (ref 200–950)
Basophils Absolute: 40 cells/uL (ref 0–200)
Basophils Relative: 0.7 %
Eosinophils Absolute: 97 cells/uL (ref 15–500)
Eosinophils Relative: 1.7 %
HCT: 40.2 % (ref 35.0–45.0)
Hemoglobin: 13.3 g/dL (ref 11.7–15.5)
Lymphs Abs: 3238 cells/uL (ref 850–3900)
MCH: 28.7 pg (ref 27.0–33.0)
MCHC: 33.1 g/dL (ref 32.0–36.0)
MCV: 86.8 fL (ref 80.0–100.0)
MPV: 11 fL (ref 7.5–12.5)
Monocytes Relative: 8.9 %
Neutro Abs: 1818 cells/uL (ref 1500–7800)
Neutrophils Relative %: 31.9 %
Platelets: 344 10*3/uL (ref 140–400)
RBC: 4.63 10*6/uL (ref 3.80–5.10)
RDW: 13.6 % (ref 11.0–15.0)
Total Lymphocyte: 56.8 %
WBC: 5.7 10*3/uL (ref 3.8–10.8)

## 2022-02-13 LAB — MAGNESIUM: Magnesium: 2 mg/dL (ref 1.5–2.5)

## 2022-02-13 LAB — URINALYSIS W MICROSCOPIC + REFLEX CULTURE
Bilirubin Urine: NEGATIVE
Glucose, UA: NEGATIVE
Hgb urine dipstick: NEGATIVE
Hyaline Cast: NONE SEEN /LPF
Ketones, ur: NEGATIVE
Leukocyte Esterase: NEGATIVE
Nitrites, Initial: NEGATIVE
Protein, ur: NEGATIVE
RBC / HPF: NONE SEEN /HPF (ref 0–2)
Specific Gravity, Urine: 1.019 (ref 1.001–1.035)
pH: 5.5 (ref 5.0–8.0)

## 2022-02-13 LAB — LIPID PANEL
Cholesterol: 166 mg/dL (ref <200)
HDL: 51 mg/dL (ref >=50)
LDL Cholesterol (Calc): 90 mg/dL (calc)
Non-HDL Cholesterol (Calc): 115 mg/dL (calc) (ref <130)
Total CHOL/HDL Ratio: 3.3 (calc) (ref <5.0)
Triglycerides: 155 mg/dL — ABNORMAL HIGH (ref <150)

## 2022-02-13 LAB — COMPLETE METABOLIC PANEL WITH GFR
AG Ratio: 1.8 (calc) (ref 1.0–2.5)
ALT: 19 U/L (ref 6–29)
AST: 22 U/L (ref 10–35)
Albumin: 4.3 g/dL (ref 3.6–5.1)
Alkaline phosphatase (APISO): 36 U/L — ABNORMAL LOW (ref 37–153)
BUN: 14 mg/dL (ref 7–25)
CO2: 33 mmol/L — ABNORMAL HIGH (ref 20–32)
Calcium: 9.2 mg/dL (ref 8.6–10.4)
Chloride: 103 mmol/L (ref 98–110)
Creat: 0.96 mg/dL (ref 0.50–1.03)
Globulin: 2.4 g/dL (calc) (ref 1.9–3.7)
Glucose, Bld: 96 mg/dL (ref 65–99)
Potassium: 4.2 mmol/L (ref 3.5–5.3)
Sodium: 144 mmol/L (ref 135–146)
Total Bilirubin: 0.5 mg/dL (ref 0.2–1.2)
Total Protein: 6.7 g/dL (ref 6.1–8.1)
eGFR: 69 mL/min/{1.73_m2} (ref >=60)

## 2022-02-13 LAB — MICROALBUMIN / CREATININE URINE RATIO
Creatinine, Urine: 133 mg/dL (ref 20–275)
Microalb Creat Ratio: 3 mcg/mg creat (ref <30)
Microalb, Ur: 0.4 mg/dL

## 2022-02-13 LAB — HEMOGLOBIN A1C
Hgb A1c MFr Bld: 6.5 % of total Hgb — ABNORMAL HIGH (ref <5.7)
Mean Plasma Glucose: 140 mg/dL
eAG (mmol/L): 7.7 mmol/L

## 2022-02-13 LAB — TSH: TSH: 2.25 mIU/L (ref 0.40–4.50)

## 2022-02-13 LAB — VITAMIN D 25 HYDROXY (VIT D DEFICIENCY, FRACTURES): Vit D, 25-Hydroxy: 37 ng/mL (ref 30–100)

## 2022-02-13 LAB — NO CULTURE INDICATED

## 2022-02-13 MED ORDER — SEMAGLUTIDE (1 MG/DOSE) 4 MG/3ML ~~LOC~~ SOPN: 1.0000 mg | PEN_INJECTOR | SUBCUTANEOUS | 3 refills | Status: DC

## 2022-03-02 ENCOUNTER — Other Ambulatory Visit: Payer: Self-pay | Admitting: Nurse Practitioner

## 2022-03-02 DIAGNOSIS — E669 Obesity, unspecified: Secondary | ICD-10-CM

## 2022-03-02 DIAGNOSIS — E1169 Type 2 diabetes mellitus with other specified complication: Secondary | ICD-10-CM

## 2022-03-02 DIAGNOSIS — E1122 Type 2 diabetes mellitus with diabetic chronic kidney disease: Secondary | ICD-10-CM

## 2022-03-18 ENCOUNTER — Encounter: Payer: Self-pay | Admitting: Nurse Practitioner

## 2022-03-19 ENCOUNTER — Telehealth: Payer: Self-pay | Admitting: Nurse Practitioner

## 2022-03-19 NOTE — Telephone Encounter (Signed)
Pt is needing a refill on accucheck test strip and lancets to [facility] on [address]

## 2022-03-20 ENCOUNTER — Other Ambulatory Visit: Payer: Self-pay

## 2022-03-20 ENCOUNTER — Other Ambulatory Visit: Payer: Self-pay | Admitting: Nurse Practitioner

## 2022-03-20 DIAGNOSIS — E1122 Type 2 diabetes mellitus with diabetic chronic kidney disease: Secondary | ICD-10-CM

## 2022-03-20 DIAGNOSIS — E1169 Type 2 diabetes mellitus with other specified complication: Secondary | ICD-10-CM

## 2022-03-20 MED ORDER — SEMAGLUTIDE(0.25 OR 0.5MG/DOS) 2 MG/3ML ~~LOC~~ SOPN: 0.5000 mg | PEN_INJECTOR | SUBCUTANEOUS | 3 refills | Status: DC

## 2022-03-20 MED ORDER — LANCETS MISC: 3 refills | Status: DC

## 2022-03-20 MED ORDER — GLUCOSE BLOOD VI STRP: ORAL_STRIP | 12 refills | Status: DC

## 2022-04-04 ENCOUNTER — Other Ambulatory Visit: Payer: Self-pay | Admitting: Nurse Practitioner

## 2022-04-04 DIAGNOSIS — E1169 Type 2 diabetes mellitus with other specified complication: Secondary | ICD-10-CM

## 2022-05-18 NOTE — Progress Notes (Unsigned)
3 MONTH FOLLOW UP  Assessment and Plan:  Ilian was seen today for acute visit and shoulder pain.  Diagnoses and all orders for this visit:  Essential hypertension -     CBC with Differential/Platelet       -  continue medications, DASH diet, exercise and monitor at home. Call if greater than 130/80.    Type 2 Diabetes Mellitus with hyperlipidemia -     COMPLETE METABOLIC PANEL WITH GFR -     Lipid panel -     TSH       - Continue medications:        - Continue diet and exercise.   Type 2 Diabetes with Morbid Obesity - Work on diet, exercise and weight loss - Started on Ozempic 0.25 mg initially then increase to 0.5 mg Weekly  Type 2 diabetes Mellitus without long term insulin use with stage 2 CKD      - Started on Ozempic 0.25 mg initially then increase to 0.5 mg Weekly       - Continue diet and exercise.        - Perform daily foot/skin check, notify office of any concerning changes.        - Check A1C   CKD stage 2 associated with Type 2 Diabetes Mellitus Increase fluids, avoid NSAIDS, monitor sugars, will monitor   Medication management -     CBC with Differential/Platelet -     COMPLETE METABOLIC PANEL WITH GFR -     Lipid panel -     TSH -     Hemoglobin A1c  Vitamin D deficiency -     VITAMIN D 25 Hydroxy (Vit-D Deficiency, Fractures) -     Cholecalciferol (VITAMIN D3) 1.25 MG (50000 UT) CAPS; Take one tablet, three days a week for twelve weeks.   Elevated LFT's - CMP - Work on diet, exercise and weight loss  Further disposition pending results of labs. Discussed med's effects and SE's.   Over 30 minutes of exam, counseling, chart review, and critical decision making was performed.   Future Appointments  Date Time Provider [place]  05/21/2022 10:30 AM Alycia Rossetti, NP [facility] None  02/18/2023  3:00 PM Alycia Rossetti, NP [facility] None      ------------------------------------------------------------------------------------------------------------------   HPI LMP 06/11/2014  61 y.o.female 6 month follow up has Prediabetes; Hypertension; Hyperlipidemia; Environmental allergies; Morbid obesity ([place]); History of colon polyps; Medication management; Vitamin D deficiency; and CKD (chronic kidney disease) stage 2, GFR 60-89 ml/min on their problem list.    BMI is There is no height or weight on file to calculate BMI., she has been working on diet . Has not been exercising. She is trying to limit simple carbs  , will do sweets late night Wt Readings from Last 3 Encounters:  02/12/22 (!) 318 lb 6.4 oz (144.4 kg)  09/25/21 (!) 338 lb (153.3 kg)  01/27/21 (!) 328 lb 6.4 oz (149 kg)   Pt has hypertension and blood pressures have been controlled on Benicar HCT 40/25. She denies headaches, chest pain, shortness of breath and dizziness. BP's have been running  130-140/80's BP Readings from Last 3 Encounters:  02/12/22 130/82  09/25/21 (!) 148/80  01/27/21 140/86    Pt is on Rosuvastatin 40 mg QD for cholesterol.  Denies myalgias.  Her Cholesterol is not at goal, LDL still above 70 Lab Results  Component Value Date   CHOL 166 02/12/2022   HDL 51 02/12/2022   LDLCALC  90 02/12/2022   TRIG 155 (H) 02/12/2022   CHOLHDL 3.3 02/12/2022   Her blood sugars are not at goal and is not on any medication. Last A1C was  Lab Results  Component Value Date   HGBA1C 6.5 (H) 02/12/2022    Her last GFR was CKD stage 2 Lab Results  Component Value Date   GFRAA 78 12/10/2019    She is on Vit D supplementation 50000 units three days a week  Last vitamin D Lab Results  Component Value Date   VD25OH 37 02/12/2022     Lab Results  Component Value Date   TSH 2.25 02/12/2022     No Known Allergies  Current Outpatient Medications on File Prior to Visit  Medication Sig   benzonatate (TESSALON PERLES) 100 MG capsule Take 1 capsule (100  mg total) by mouth 3 (three) times daily as needed for cough.   cetirizine (ZYRTEC) 10 MG tablet Take 10 mg by mouth daily as needed for allergies.    Cholecalciferol (VITAMIN D3) 1.25 MG (50000 UT) CAPS Take one tablet, three days a week for twelve weeks.   glucose blood test strip Use as instructed   Lancets MISC Use to test blood sugar once daily   olmesartan-hydrochlorothiazide (BENICAR HCT) 40-25 MG tablet TAKE 1 TABLET DAILY FOR BLOOD PRESSURE & FLUID RETENTION [name] SWELLING   OVER THE COUNTER MEDICATION OTC acid reducer   rosuvastatin (CRESTOR) 40 MG tablet TAKE 1 TABLET BY MOUTH EVERY DAY   Semaglutide,0.25 or 0.'5MG'$ /DOS, 2 MG/3ML SOPN Inject 0.5 mg into the skin once a week.   No current facility-administered medications on file prior to visit.   Review of Systems  Constitutional:  Negative for chills, fever and weight loss.  HENT:  Negative for congestion and hearing loss.   Eyes:  Negative for blurred vision and double vision.  Respiratory:  Negative for cough and shortness of breath.   Cardiovascular:  Negative for chest pain, palpitations, orthopnea and leg swelling.  Gastrointestinal:  Negative for abdominal pain, constipation, diarrhea, heartburn, nausea and vomiting.  Musculoskeletal:  Negative for falls, joint pain and myalgias.  Skin:  Negative for rash.  Neurological:  Negative for dizziness, tingling, tremors, loss of consciousness and headaches.  Psychiatric/Behavioral:  Negative for depression, memory loss and suicidal ideas.     Physical Exam:  LMP 06/11/2014   General Appearance: Well nourished, in no apparent distress. Eyes: PERRLA, EOMs, conjunctiva no swelling or erythema Sinuses: No Frontal/maxillary tenderness ENT/Mouth: Ext aud canals clear, TMs without erythema, bulging. No erythema, swelling, or exudate on post pharynx.  Tonsils not swollen or erythematous. Hearing normal.  Neck: Supple, thyroid normal.  Respiratory: Respiratory effort normal, BS  equal bilaterally without rales, rhonchi, wheezing or stridor.  Cardio: RRR with no MRGs. Brisk peripheral pulses without edema.  Abdomen: Soft, + BS.  Non tender, no guarding, rebound, hernias, masses. Lymphatics: Non tender without lymphadenopathy.  Musculoskeletal: Full ROM, 5/5 strength, normal gait.  Skin: Warm, dry without rashes, lesions, ecchymosis.  Neuro: Cranial nerves intact. Normal muscle tone, no cerebellar symptoms. Sensation intact.  Psych: Awake and oriented X 3, normal affect, Insight and Judgment appropriate.     Alycia Rossetti, NP 11:07 AM [facility]

## 2022-05-21 ENCOUNTER — Ambulatory Visit (INDEPENDENT_AMBULATORY_CARE_PROVIDER_SITE_OTHER): Payer: No Typology Code available for payment source | Admitting: Nurse Practitioner

## 2022-05-21 ENCOUNTER — Encounter: Payer: Self-pay | Admitting: Nurse Practitioner

## 2022-05-21 VITALS — BP 131/83 | HR 83 | Temp 97.7°F | Ht 65.0 in | Wt 325.6 lb

## 2022-05-21 DIAGNOSIS — N182 Chronic kidney disease, stage 2 (mild): Secondary | ICD-10-CM

## 2022-05-21 DIAGNOSIS — E1122 Type 2 diabetes mellitus with diabetic chronic kidney disease: Secondary | ICD-10-CM | POA: Diagnosis not present

## 2022-05-21 DIAGNOSIS — I10 Essential (primary) hypertension: Secondary | ICD-10-CM

## 2022-05-21 DIAGNOSIS — R7989 Other specified abnormal findings of blood chemistry: Secondary | ICD-10-CM

## 2022-05-21 DIAGNOSIS — E1169 Type 2 diabetes mellitus with other specified complication: Secondary | ICD-10-CM

## 2022-05-21 DIAGNOSIS — E559 Vitamin D deficiency, unspecified: Secondary | ICD-10-CM | POA: Diagnosis not present

## 2022-05-21 DIAGNOSIS — Z79899 Other long term (current) drug therapy: Secondary | ICD-10-CM

## 2022-05-21 DIAGNOSIS — E785 Hyperlipidemia, unspecified: Secondary | ICD-10-CM

## 2022-05-21 MED ORDER — SEMAGLUTIDE (1 MG/DOSE) 4 MG/3ML ~~LOC~~ SOPN: 1.0000 mg | PEN_INJECTOR | SUBCUTANEOUS | 3 refills | Status: DC

## 2022-05-21 NOTE — Patient Instructions (Signed)

## 2022-05-22 LAB — CBC WITH DIFFERENTIAL/PLATELET
Absolute Monocytes: 396 cells/uL (ref 200–950)
Basophils Absolute: 18 cells/uL (ref 0–200)
Basophils Relative: 0.4 %
Eosinophils Absolute: 101 cells/uL (ref 15–500)
Eosinophils Relative: 2.2 %
HCT: 41.3 % (ref 35.0–45.0)
Hemoglobin: 13.7 g/dL (ref 11.7–15.5)
Lymphs Abs: 2429 cells/uL (ref 850–3900)
MCH: 28.5 pg (ref 27.0–33.0)
MCHC: 33.2 g/dL (ref 32.0–36.0)
MCV: 86 fL (ref 80.0–100.0)
MPV: 10.9 fL (ref 7.5–12.5)
Monocytes Relative: 8.6 %
Neutro Abs: 1656 cells/uL (ref 1500–7800)
Neutrophils Relative %: 36 %
Platelets: 317 10*3/uL (ref 140–400)
RBC: 4.8 10*6/uL (ref 3.80–5.10)
RDW: 13.1 % (ref 11.0–15.0)
Total Lymphocyte: 52.8 %
WBC: 4.6 10*3/uL (ref 3.8–10.8)

## 2022-05-22 LAB — COMPLETE METABOLIC PANEL WITH GFR
AG Ratio: 1.7 (calc) (ref 1.0–2.5)
ALT: 18 U/L (ref 6–29)
AST: 20 U/L (ref 10–35)
Albumin: 4.3 g/dL (ref 3.6–5.1)
Alkaline phosphatase (APISO): 36 U/L — ABNORMAL LOW (ref 37–153)
BUN: 14 mg/dL (ref 7–25)
CO2: 32 mmol/L (ref 20–32)
Calcium: 9.3 mg/dL (ref 8.6–10.4)
Chloride: 101 mmol/L (ref 98–110)
Creat: 0.95 mg/dL (ref 0.50–1.03)
Globulin: 2.5 g/dL (calc) (ref 1.9–3.7)
Glucose, Bld: 105 mg/dL — ABNORMAL HIGH (ref 65–99)
Potassium: 4.2 mmol/L (ref 3.5–5.3)
Sodium: 141 mmol/L (ref 135–146)
Total Bilirubin: 0.4 mg/dL (ref 0.2–1.2)
Total Protein: 6.8 g/dL (ref 6.1–8.1)
eGFR: 69 mL/min/{1.73_m2} (ref >=60)

## 2022-05-22 LAB — LIPID PANEL
Cholesterol: 165 mg/dL (ref <200)
HDL: 59 mg/dL (ref >=50)
LDL Cholesterol (Calc): 85 mg/dL (calc)
Non-HDL Cholesterol (Calc): 106 mg/dL (calc) (ref <130)
Total CHOL/HDL Ratio: 2.8 (calc) (ref <5.0)
Triglycerides: 116 mg/dL (ref <150)

## 2022-05-22 LAB — HEMOGLOBIN A1C
Hgb A1c MFr Bld: 6.5 % of total Hgb — ABNORMAL HIGH (ref <5.7)
Mean Plasma Glucose: 140 mg/dL
eAG (mmol/L): 7.7 mmol/L

## 2022-09-05 ENCOUNTER — Ambulatory Visit: Payer: No Typology Code available for payment source | Admitting: Nurse Practitioner

## 2022-09-11 NOTE — Progress Notes (Signed)
3 MONTH FOLLOW UP  Assessment and Plan:  [name] was seen today for acute visit and shoulder pain.  Diagnoses and all orders for this visit:  Essential hypertension - continue medications, DASH diet, exercise and monitor at home. Call if greater than 130/80.  -     CBC with Differential/Platelet     Type 2 Diabetes Mellitus with hyperlipidemia -     COMPLETE METABOLIC PANEL WITH GFR -     Lipid panel, TSH       - Continue medications: Ozempic 1 mg SQ QW       - Continue diet and exercise.   Type 2 Diabetes with Morbid Obesity- BMI 54 - Work on diet, exercise and weight loss - Ozempic 1 mg Weekly  Type 2 diabetes Mellitus without long term insulin use with stage 2 CKD      -  Currently on Ozempic 1 mg QW        - Continue diet and exercise.        - Perform daily foot/skin check, notify office of any concerning changes.        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 Discussed med's effects and SE's.   Over 30 minutes of exam, counseling, chart review, and critical decision making was performed.   Future Appointments  Date Time Provider Department Center  02/18/2023  3:00 PM Raynelle Dick, NP [facility] None     ------------------------------------------------------------------------------------------------------------------   HPI BP 115/70   Pulse 87   Temp 97.7 F (36.5 C)   Ht 5\' 5"  (1.651 m)   Wt (!) 325 lb (147.4 kg)   LMP  06/11/2014   SpO2 98%   BMI 54.08 kg/m  62 y.o.female 6 month follow up has Prediabetes; Essential hypertension; Hyperlipidemia; Environmental allergies; Morbid obesity (HCC); History of colon polyps; Medication management; Vitamin D deficiency; CKD stage 2 due to type 2 diabetes mellitus (HCC); and Type 2 diabetes mellitus with morbid obesity (HCC) on their problem list.   She has been noticing a worsening of heartburn and does use Famotidine  BMI is Body mass index is 54.08 kg/m., she has been working on diet . Has not been exercising much, she parks further away when goes to the store.  Wants to start doing 30 minutes of exercise before going to work. . She has been limiting simple carbs- sweets are still an issue Wt Readings from Last 3 Encounters:  09/12/22 (!) 325 lb (147.4 kg)  05/21/22 (!) 325 lb 9.6 oz (147.7 kg)  02/12/22 (!) 318 lb 6.4 oz (144.4 kg)   Pt has hypertension and blood pressures have been controlled on Benicar HCT 40/25. She denies headaches, chest pain, shortness of breath and dizziness.  BP Readings from Last 3 Encounters:  09/12/22 115/70  05/21/22 131/83  02/12/22 130/82    Pt is on Rosuvastatin 40 mg QD for cholesterol.  Denies myalgias.  Her Cholesterol is not at goal, LDL still above 70 Lab Results  Component Value Date   CHOL 165 05/21/2022   HDL 59 05/21/2022   LDLCALC 85 05/21/2022   TRIG 116 05/21/2022   CHOLHDL 2.8 05/21/2022   Her blood sugars have been running fasting around 85.  Denies headaches, visual changes.  Occasional tingling of hands Denies numbness/tingling of feet Checks feet regularly Eye exam 12/2021  Last A1C was  Lab Results  Component Value Date   HGBA1C 6.5 (H) 05/21/2022    She is drinking a lot more water. Her last GFR was CKD stage 2 Lab Results  Component Value Date   EGFR 69 05/21/2022     She is on Vit D supplementation 50000 units three days a week  Last vitamin D Lab Results  Component Value Date   VD25OH  37 02/12/2022        No Known Allergies  Current Outpatient Medications on File Prior to Visit  Medication Sig   cetirizine (ZYRTEC) 10 MG tablet Take 10 mg by mouth daily as needed for allergies.    olmesartan-hydrochlorothiazide (BENICAR HCT) 40-25 MG tablet TAKE 1 TABLET DAILY FOR BLOOD PRESSURE & FLUID RETENTION Lynnda Child SWELLING   OVER THE COUNTER MEDICATION OTC acid reducer   rosuvastatin (CRESTOR) 40 MG tablet TAKE 1 TABLET BY MOUTH EVERY DAY   Semaglutide, 1 MG/DOSE, 4 MG/3ML SOPN Inject 1 mg into the skin once a week.   Cholecalciferol (VITAMIN D3) 1.25 MG (50000 UT) CAPS Take one tablet, three days a week for twelve weeks. (Patient not taking: Reported on 09/12/2022)   glucose blood test strip Use as instructed   Lancets MISC Use to test blood sugar once daily   No current facility-administered medications on file prior to visit.   Review of Systems  Constitutional:  Negative for chills, fever and weight loss.  HENT:  Negative for congestion and hearing loss.   Eyes:  Negative for blurred vision and double vision.  Respiratory:  Negative for cough and shortness of breath.   Cardiovascular:  Negative for chest pain, palpitations, orthopnea and leg swelling.  Gastrointestinal:  Negative for abdominal pain, constipation, diarrhea, heartburn, nausea and vomiting.  Musculoskeletal:  Negative for falls, joint pain and myalgias.  Skin:  Negative for rash.  Neurological:  Positive for tingling (occasional of hands). Negative for dizziness, tremors, loss of consciousness and headaches.  Psychiatric/Behavioral:  Negative for depression, memory loss and suicidal ideas.     Physical Exam:  BP 115/70   Pulse 87   Temp 97.7 F (36.5 C)   Ht 5\' 5"  (1.651 m)   Wt (!) 325 lb (147.4 kg)   LMP 06/11/2014   SpO2 98%   BMI 54.08 kg/m   General Appearance: Very pleasant morbidly obese female, in no apparent distress. Eyes: PERRLA, EOMs, conjunctiva no swelling or erythema Sinuses: No  Frontal/maxillary tenderness ENT/Mouth: Ext aud canals clear, TMs without erythema, bulging. No erythema, swelling, or exudate on post pharynx.  Tonsils not swollen or erythematous. Hearing normal.  Neck: Supple, thyroid normal.  Respiratory: Respiratory effort normal, BS equal bilaterally without rales, rhonchi, wheezing or stridor.  Cardio: RRR with no MRGs. Brisk peripheral pulses without edema.  Abdomen: Soft, + BS.  Non tender, no guarding, rebound, hernias, masses. Lymphatics: Non tender without lymphadenopathy.  Musculoskeletal: Full ROM, 5/5 strength, normal gait.  Skin: Warm, dry without rashes, lesions, ecchymosis.  Neuro: Cranial nerves intact. Normal muscle tone, no cerebellar symptoms. Sensation  intact.  Psych: Awake and oriented X 3, normal affect, Insight and Judgment appropriate.     Raynelle Dick, NP 11:59 AM [facility]

## 2022-09-12 ENCOUNTER — Encounter: Payer: Self-pay | Admitting: Nurse Practitioner

## 2022-09-12 ENCOUNTER — Ambulatory Visit (INDEPENDENT_AMBULATORY_CARE_PROVIDER_SITE_OTHER): Payer: No Typology Code available for payment source | Admitting: Nurse Practitioner

## 2022-09-12 VITALS — BP 115/70 | HR 87 | Temp 97.7°F | Ht 65.0 in | Wt 325.0 lb

## 2022-09-12 DIAGNOSIS — I10 Essential (primary) hypertension: Secondary | ICD-10-CM

## 2022-09-12 DIAGNOSIS — E559 Vitamin D deficiency, unspecified: Secondary | ICD-10-CM | POA: Diagnosis not present

## 2022-09-12 DIAGNOSIS — E1169 Type 2 diabetes mellitus with other specified complication: Secondary | ICD-10-CM

## 2022-09-12 DIAGNOSIS — K219 Gastro-esophageal reflux disease without esophagitis: Secondary | ICD-10-CM

## 2022-09-12 DIAGNOSIS — Z79899 Other long term (current) drug therapy: Secondary | ICD-10-CM

## 2022-09-12 DIAGNOSIS — E785 Hyperlipidemia, unspecified: Secondary | ICD-10-CM

## 2022-09-12 DIAGNOSIS — N182 Chronic kidney disease, stage 2 (mild): Secondary | ICD-10-CM

## 2022-09-12 DIAGNOSIS — E1122 Type 2 diabetes mellitus with diabetic chronic kidney disease: Secondary | ICD-10-CM

## 2022-09-12 MED ORDER — VITAMIN D3 1.25 MG (50000 UT) PO CAPS
ORAL_CAPSULE | ORAL | 1 refills | Status: DC
Start: 2022-09-12 — End: 2022-09-18

## 2022-09-12 MED ORDER — PANTOPRAZOLE SODIUM 40 MG PO TBEC
40.0000 mg | DELAYED_RELEASE_TABLET | Freq: Every day | ORAL | 3 refills | Status: AC
Start: 2022-09-12 — End: 2023-09-12

## 2022-09-12 NOTE — Patient Instructions (Signed)

## 2022-09-13 LAB — CBC WITH DIFFERENTIAL/PLATELET
Absolute Monocytes: 426 {cells}/uL (ref 200–950)
Basophils Absolute: 21 {cells}/uL (ref 0–200)
Basophils Relative: 0.4 %
Eosinophils Absolute: 99 {cells}/uL (ref 15–500)
Eosinophils Relative: 1.9 %
HCT: 39.8 % (ref 35.0–45.0)
Hemoglobin: 13.5 g/dL (ref 11.7–15.5)
Lymphs Abs: 2361 {cells}/uL (ref 850–3900)
MCH: 29 pg (ref 27.0–33.0)
MCHC: 33.9 g/dL (ref 32.0–36.0)
MCV: 85.4 fL (ref 80.0–100.0)
MPV: 11 fL (ref 7.5–12.5)
Monocytes Relative: 8.2 %
Neutro Abs: 2293 {cells}/uL (ref 1500–7800)
Neutrophils Relative %: 44.1 %
Platelets: 320 10*3/uL (ref 140–400)
RBC: 4.66 Million/uL (ref 3.80–5.10)
RDW: 13.6 % (ref 11.0–15.0)
Total Lymphocyte: 45.4 %
WBC: 5.2 10*3/uL (ref 3.8–10.8)

## 2022-09-13 LAB — COMPLETE METABOLIC PANEL WITH GFR
AG Ratio: 1.7 (calc) (ref 1.0–2.5)
ALT: 18 U/L (ref 6–29)
AST: 21 U/L (ref 10–35)
Albumin: 4.1 g/dL (ref 3.6–5.1)
Alkaline phosphatase (APISO): 35 U/L — ABNORMAL LOW (ref 37–153)
BUN: 13 mg/dL (ref 7–25)
CO2: 35 mmol/L — ABNORMAL HIGH (ref 20–32)
Calcium: 9.9 mg/dL (ref 8.6–10.4)
Chloride: 100 mmol/L (ref 98–110)
Creat: 0.93 mg/dL (ref 0.50–1.03)
Globulin: 2.4 g/dL (calc) (ref 1.9–3.7)
Glucose, Bld: 120 mg/dL — ABNORMAL HIGH (ref 65–99)
Potassium: 3.7 mmol/L (ref 3.5–5.3)
Sodium: 143 mmol/L (ref 135–146)
Total Bilirubin: 0.4 mg/dL (ref 0.2–1.2)
Total Protein: 6.5 g/dL (ref 6.1–8.1)
eGFR: 71 mL/min/{1.73_m2} (ref >=60)

## 2022-09-13 LAB — HEMOGLOBIN A1C
Hgb A1c MFr Bld: 6.4 % of total Hgb — ABNORMAL HIGH (ref <5.7)
Mean Plasma Glucose: 137 mg/dL
eAG (mmol/L): 7.6 mmol/L

## 2022-09-13 LAB — LIPID PANEL
Cholesterol: 185 mg/dL
HDL: 53 mg/dL
LDL Cholesterol (Calc): 104 mg/dL — ABNORMAL HIGH
Non-HDL Cholesterol (Calc): 132 mg/dL — ABNORMAL HIGH
Total CHOL/HDL Ratio: 3.5 (calc)
Triglycerides: 162 mg/dL — ABNORMAL HIGH

## 2022-09-13 LAB — VITAMIN D 25 HYDROXY (VIT D DEFICIENCY, FRACTURES): Vit D, 25-Hydroxy: 35 ng/mL (ref 30–100)

## 2022-09-13 LAB — TSH: TSH: 2.17 mIU/L (ref 0.40–4.50)

## 2022-09-16 ENCOUNTER — Other Ambulatory Visit: Payer: Self-pay | Admitting: Nurse Practitioner

## 2022-09-16 DIAGNOSIS — E1169 Type 2 diabetes mellitus with other specified complication: Secondary | ICD-10-CM

## 2022-09-16 DIAGNOSIS — E1122 Type 2 diabetes mellitus with diabetic chronic kidney disease: Secondary | ICD-10-CM

## 2022-09-18 ENCOUNTER — Other Ambulatory Visit: Payer: Self-pay | Admitting: Nurse Practitioner

## 2022-09-18 DIAGNOSIS — E559 Vitamin D deficiency, unspecified: Secondary | ICD-10-CM

## 2022-11-15 ENCOUNTER — Other Ambulatory Visit: Payer: Self-pay | Admitting: Internal Medicine

## 2022-12-13 ENCOUNTER — Other Ambulatory Visit: Payer: Self-pay | Admitting: Nurse Practitioner

## 2022-12-13 DIAGNOSIS — K219 Gastro-esophageal reflux disease without esophagitis: Secondary | ICD-10-CM

## 2022-12-18 NOTE — Progress Notes (Unsigned)
3 MONTH FOLLOW UP  Assessment and Plan:  [name] was seen today for acute visit and shoulder pain.  Diagnoses and all orders for this visit:  Essential hypertension - continue medications, DASH diet, exercise and monitor at home. Call if greater than 130/80.  -     CBC with Differential/Platelet     Type 2 Diabetes Mellitus with hyperlipidemia -     COMPLETE METABOLIC PANEL WITH GFR -     Lipid panel, TSH       - Continue medications: Ozempic 1 mg SQ QW       - Continue diet and exercise.   Type 2 Diabetes with Morbid Obesity- BMI 54 - Work on diet, exercise and weight loss - Ozempic 1 mg Weekly  Type 2 diabetes Mellitus without long term insulin use with stage 2 CKD      -  Currently on Ozempic 1 mg QW        - Continue diet and exercise.        - Perform daily foot/skin check, notify office of any concerning changes.        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 Discussed med's effects and SE's.   Over 30 minutes of exam, counseling, chart review, and critical decision making was performed.   Future Appointments  Date Time Provider Department Center  12/19/2022  9:45 AM Raynelle Dick, NP [facility] None  03/29/2023  9:00 AM Raynelle Dick, NP [facility] None     ------------------------------------------------------------------------------------------------------------------   HPI LMP 06/11/2014  62 y.o.female 6 month follow up  has Prediabetes; Essential hypertension; Hyperlipidemia; Environmental allergies; Morbid obesity (HCC); History of colon polyps; Medication management; Vitamin D deficiency; CKD stage 2 due to type 2 diabetes mellitus (HCC); and Type 2 diabetes mellitus with morbid obesity (HCC) on their problem list.   She has been noticing a worsening of heartburn and does use Famotidine  BMI is There is no height or weight on file to calculate BMI., she has been working on diet . Has not been exercising much, she parks further away when goes to the store.  Wants to start doing 30 minutes of exercise before going to work. . She has been limiting simple carbs- sweets are still an issue Wt Readings from Last 3 Encounters:  09/12/22 (!) 325 lb (147.4 kg)  05/21/22 (!) 325 lb 9.6 oz (147.7 kg)  02/12/22 (!) 318 lb 6.4 oz (144.4 kg)   Pt has hypertension and blood pressures have been controlled on Benicar HCT 40/25. She denies headaches, chest pain, shortness of breath and dizziness.  BP Readings from Last 3 Encounters:  09/12/22 115/70  05/21/22 131/83  02/12/22 130/82    Pt is on Rosuvastatin 40 mg QD for cholesterol.  Denies myalgias.  Her Cholesterol is not at goal, LDL still above 70 Lab Results  Component Value Date   CHOL 185  09/12/2022   HDL 53 09/12/2022   LDLCALC 104 (H) 09/12/2022   TRIG 162 (H) 09/12/2022   CHOLHDL 3.5 09/12/2022   Her blood sugars have been running fasting around 85.  Denies headaches, visual changes.  Occasional tingling of hands Denies numbness/tingling of feet Checks feet regularly Eye exam 12/2021  Last A1C was  Lab Results  Component Value Date   HGBA1C 6.4 (H) 09/12/2022    She is drinking a lot more water. Her last GFR was CKD stage 2 Lab Results  Component Value Date   EGFR 71 09/12/2022     She is on Vit D supplementation 50000 units three days a week  Last vitamin D Lab Results  Component Value Date   VD25OH 35 09/12/2022        No Known  Allergies  Current Outpatient Medications on File Prior to Visit  Medication Sig   cetirizine (ZYRTEC) 10 MG tablet Take 10 mg by mouth daily as needed for allergies.    Cholecalciferol (VITAMIN D3) 1.25 MG (50000 UT) CAPS TAKE ONE TABLET, THREE DAYS A WEEK FOR TWELVE WEEKS.   glucose blood test strip Use as instructed   Lancets MISC Use to test blood sugar once daily   olmesartan-hydrochlorothiazide (BENICAR HCT) 40-25 MG tablet TAKE 1 TABLET DAILY FOR BLOOD PRESSURE & FLUID RETENTION Lynnda Child SWELLING   OVER THE COUNTER MEDICATION OTC acid reducer   pantoprazole (PROTONIX) 40 MG tablet TAKE 1 TABLET BY MOUTH EVERY DAY   rosuvastatin (CRESTOR) 40 MG tablet TAKE 1 TABLET BY MOUTH EVERY DAY   Semaglutide, 1 MG/DOSE, (OZEMPIC, 1 MG/DOSE,) 4 MG/3ML SOPN INJECT 1MG  INTO THE SKIN ONCE A WEEK   No current facility-administered medications on file prior to visit.   Review of Systems  Constitutional:  Negative for chills, fever and weight loss.  HENT:  Negative for congestion and hearing loss.   Eyes:  Negative for blurred vision and double vision.  Respiratory:  Negative for cough and shortness of breath.   Cardiovascular:  Negative for chest pain, palpitations, orthopnea and leg swelling.  Gastrointestinal:  Negative for abdominal pain, constipation, diarrhea, heartburn, nausea and vomiting.  Musculoskeletal:  Negative for falls, joint pain and myalgias.  Skin:  Negative for rash.  Neurological:  Positive for tingling (occasional of hands). Negative for dizziness, tremors, loss of consciousness and headaches.  Psychiatric/Behavioral:  Negative for depression, memory loss and suicidal ideas.     Physical Exam:  LMP 06/11/2014   General Appearance: Very pleasant morbidly obese female, in no apparent distress. Eyes: PERRLA, EOMs, conjunctiva no swelling or erythema Sinuses: No Frontal/maxillary tenderness ENT/Mouth: Ext aud canals clear, TMs without erythema, bulging. No erythema, swelling,  or exudate on post pharynx.  Tonsils not swollen or erythematous. Hearing normal.  Neck: Supple, thyroid normal.  Respiratory: Respiratory effort normal, BS equal bilaterally without rales, rhonchi, wheezing or stridor.  Cardio: RRR with no MRGs. Brisk peripheral pulses without edema.  Abdomen: Soft, + BS.  Non tender, no guarding, rebound, hernias, masses. Lymphatics: Non tender without lymphadenopathy.  Musculoskeletal: Full ROM, 5/5 strength, normal gait.  Skin: Warm, dry without rashes, lesions, ecchymosis.  Neuro: Cranial nerves intact. Normal muscle tone, no cerebellar symptoms. Sensation intact.  Psych: Awake and oriented X 3, normal affect, Insight and Judgment appropriate.     Raynelle Dick, NP 12:51 PM [facility]

## 2022-12-19 ENCOUNTER — Encounter: Payer: Self-pay | Admitting: Nurse Practitioner

## 2022-12-19 ENCOUNTER — Ambulatory Visit: Payer: No Typology Code available for payment source | Admitting: Nurse Practitioner

## 2022-12-19 VITALS — BP 119/75 | HR 77 | Temp 97.7°F | Ht 65.0 in | Wt 328.0 lb

## 2022-12-19 DIAGNOSIS — H6991 Unspecified Eustachian tube disorder, right ear: Secondary | ICD-10-CM

## 2022-12-19 DIAGNOSIS — E1122 Type 2 diabetes mellitus with diabetic chronic kidney disease: Secondary | ICD-10-CM

## 2022-12-19 DIAGNOSIS — I10 Essential (primary) hypertension: Secondary | ICD-10-CM

## 2022-12-19 DIAGNOSIS — E559 Vitamin D deficiency, unspecified: Secondary | ICD-10-CM | POA: Diagnosis not present

## 2022-12-19 DIAGNOSIS — E1169 Type 2 diabetes mellitus with other specified complication: Secondary | ICD-10-CM

## 2022-12-19 DIAGNOSIS — Z23 Encounter for immunization: Secondary | ICD-10-CM

## 2022-12-19 DIAGNOSIS — E785 Hyperlipidemia, unspecified: Secondary | ICD-10-CM

## 2022-12-19 DIAGNOSIS — N182 Chronic kidney disease, stage 2 (mild): Secondary | ICD-10-CM

## 2022-12-19 DIAGNOSIS — K219 Gastro-esophageal reflux disease without esophagitis: Secondary | ICD-10-CM

## 2022-12-19 DIAGNOSIS — Z79899 Other long term (current) drug therapy: Secondary | ICD-10-CM

## 2022-12-19 NOTE — Patient Instructions (Addendum)
Eustachian Tube Dysfunction  Eustachian tube dysfunction refers to a condition in which a blockage develops in the narrow passage that connects the middle ear to the back of the nose (eustachian tube). The eustachian tube regulates air pressure in the middle ear by letting air move between the ear and nose. It also helps to drain fluid from the middle ear space. Eustachian tube dysfunction can affect one or both ears. When the eustachian tube does not function properly, air pressure, fluid, or both can build up in the middle ear. What are the causes? This condition occurs when the eustachian tube becomes blocked or cannot open normally. Common causes of this condition include: Ear infections. Colds and other infections that affect the nose, mouth, and throat (upper respiratory tract). Allergies. Irritation from cigarette smoke. Irritation from stomach acid coming up into the esophagus (gastroesophageal reflux). The esophagus is the part of the body that moves food from the mouth to the stomach. Sudden changes in air pressure, such as from descending in an airplane or scuba diving. Abnormal growths in the nose or throat, such as: Growths that line the nose (nasal polyps). Abnormal growth of cells (tumors). Enlarged tissue at the back of the throat (adenoids). What increases the risk? You are more likely to develop this condition if: You smoke. You are overweight. You are a child who has: Certain birth defects of the mouth, such as cleft palate. Large tonsils or adenoids. What are the signs or symptoms? Common symptoms of this condition include: A feeling of fullness in the ear. Ear pain. Clicking or popping noises in the ear. Ringing in the ear (tinnitus). Hearing loss. Loss of balance. Dizziness. Symptoms may get worse when the air pressure around you changes, such as when you travel to an area of high elevation, fly on an airplane, or go scuba diving. How is this diagnosed? This  condition may be diagnosed based on: Your symptoms. A physical exam of your ears, nose, and throat. Tests, such as those that measure: The movement of your eardrum. Your hearing (audiometry). How is this treated? Treatment depends on the cause and severity of your condition. In mild cases, you may relieve your symptoms by moving air into your ears. This is called "popping the ears." In more severe cases, or if you have symptoms of fluid in your ears, treatment may include: Medicines to relieve congestion (decongestants). Medicines that treat allergies (antihistamines). Nasal sprays or ear drops that contain medicines that reduce swelling (steroids). A procedure to drain the fluid in your eardrum. In this procedure, a small tube may be placed in the eardrum to: Drain the fluid. Restore the air in the middle ear space. A procedure to insert a balloon device through the nose to inflate the opening of the eustachian tube (balloon dilation). Follow these instructions at home: Lifestyle Do not do any of the following until your health care provider approves: Travel to high altitudes. Fly in airplanes. Work in a pressurized cabin or room. Scuba dive. Do not use any products that contain nicotine or tobacco. These products include cigarettes, chewing tobacco, and vaping devices, such as e-cigarettes. If you need help quitting, ask your health care provider. Keep your ears dry. Wear fitted earplugs during showering and bathing. Dry your ears completely after. General instructions Take over-the-counter and prescription medicines only as told by your health care provider. Use techniques to help pop your ears as recommended by your health care provider. These may include: Chewing gum. Yawning. Frequent, forceful swallowing.   Closing your mouth, holding your nose closed, and gently blowing as if you are trying to blow air out of your nose. Keep all follow-up visits. This is important. Contact a  health care provider if: Your symptoms do not go away after treatment. Your symptoms come back after treatment. You are unable to pop your ears. You have: A fever. Pain in your ear. Pain in your head or neck. Fluid draining from your ear. Your hearing suddenly changes. You become very dizzy. You lose your balance. Get help right away if: You have a sudden, severe increase in any of your symptoms. Summary Eustachian tube dysfunction refers to a condition in which a blockage develops in the eustachian tube. It can be caused by ear infections, allergies, inhaled irritants, or abnormal growths in the nose or throat. Symptoms may include ear pain or fullness, hearing loss, or ringing in the ears. Mild cases are treated with techniques to unblock the ears, such as yawning or chewing gum. More severe cases are treated with medicines or procedures. This information is not intended to replace advice given to you by your health care provider. Make sure you discuss any questions you have with your health care provider. Document Revised: 05/09/2020 Document Reviewed: 05/09/2020 Elsevier Patient Education  2024 [employer] Inc[employer]  

## 2022-12-20 ENCOUNTER — Other Ambulatory Visit: Payer: Self-pay | Admitting: Nurse Practitioner

## 2022-12-20 DIAGNOSIS — E1169 Type 2 diabetes mellitus with other specified complication: Secondary | ICD-10-CM

## 2022-12-20 LAB — COMPLETE METABOLIC PANEL WITH GFR
AG Ratio: 1.9 (calc) (ref 1.0–2.5)
ALT: 21 U/L (ref 6–29)
AST: 28 U/L (ref 10–35)
Albumin: 4.4 g/dL (ref 3.6–5.1)
Alkaline phosphatase (APISO): 34 U/L — ABNORMAL LOW (ref 37–153)
BUN/Creatinine Ratio: 11 (calc) (ref 6–22)
BUN: 12 mg/dL (ref 7–25)
CO2: 32 mmol/L (ref 20–32)
Calcium: 9.6 mg/dL (ref 8.6–10.4)
Chloride: 100 mmol/L (ref 98–110)
Creat: 1.12 mg/dL — ABNORMAL HIGH (ref 0.50–1.03)
Globulin: 2.3 g/dL (ref 1.9–3.7)
Glucose, Bld: 97 mg/dL (ref 65–99)
Potassium: 4.2 mmol/L (ref 3.5–5.3)
Sodium: 141 mmol/L (ref 135–146)
Total Bilirubin: 0.7 mg/dL (ref 0.2–1.2)
Total Protein: 6.7 g/dL (ref 6.1–8.1)
eGFR: 57 mL/min/{1.73_m2} — ABNORMAL LOW (ref >=60)

## 2022-12-20 LAB — CBC WITH DIFFERENTIAL/PLATELET
Absolute Monocytes: 410 {cells}/uL (ref 200–950)
Basophils Absolute: 20 {cells}/uL (ref 0–200)
Basophils Relative: 0.4 %
Eosinophils Absolute: 100 {cells}/uL (ref 15–500)
Eosinophils Relative: 2 %
HCT: 41.4 % (ref 35.0–45.0)
Hemoglobin: 13.4 g/dL (ref 11.7–15.5)
Lymphs Abs: 2730 {cells}/uL (ref 850–3900)
MCH: 28.1 pg (ref 27.0–33.0)
MCHC: 32.4 g/dL (ref 32.0–36.0)
MCV: 86.8 fL (ref 80.0–100.0)
MPV: 10.7 fL (ref 7.5–12.5)
Monocytes Relative: 8.2 %
Neutro Abs: 1740 {cells}/uL (ref 1500–7800)
Neutrophils Relative %: 34.8 %
Platelets: 337 10*3/uL (ref 140–400)
RBC: 4.77 10*6/uL (ref 3.80–5.10)
RDW: 13.3 % (ref 11.0–15.0)
Total Lymphocyte: 54.6 %
WBC: 5 10*3/uL (ref 3.8–10.8)

## 2022-12-20 LAB — HEMOGLOBIN A1C W/OUT EAG: Hgb A1c MFr Bld: 6.6 %{Hb} — ABNORMAL HIGH (ref <5.7)

## 2022-12-20 LAB — LIPID PANEL
Cholesterol: 213 mg/dL — ABNORMAL HIGH (ref <200)
HDL: 54 mg/dL (ref >=50)
LDL Cholesterol (Calc): 132 mg/dL — ABNORMAL HIGH
Non-HDL Cholesterol (Calc): 159 mg/dL — ABNORMAL HIGH (ref <130)
Total CHOL/HDL Ratio: 3.9 (calc) (ref <5.0)
Triglycerides: 157 mg/dL — ABNORMAL HIGH (ref <150)

## 2022-12-20 MED ORDER — EZETIMIBE 10 MG PO TABS
10.0000 mg | ORAL_TABLET | Freq: Every day | ORAL | 11 refills | Status: DC
Start: 2022-12-20 — End: 2023-06-09

## 2022-12-20 MED ORDER — OZEMPIC (2 MG/DOSE) 8 MG/3ML ~~LOC~~ SOPN
2.0000 mg | PEN_INJECTOR | SUBCUTANEOUS | 3 refills | Status: DC
Start: 2022-12-20 — End: 2023-04-23

## 2023-02-18 ENCOUNTER — Encounter: Payer: No Typology Code available for payment source | Admitting: Nurse Practitioner

## 2023-03-28 NOTE — Progress Notes (Addendum)
Complete Physical  Assessment and Plan:  Simyah was seen today for annual exam.  Diagnoses and all orders for this visit:  Encounter for general adult medical examination with abnormal findings Due Yearly  Essential hypertension Continue current medications: Olmesartan/HCTZ 40mg -25mg  Monitor blood pressure at home; call if consistently over 130/80 Continue DASH diet.   Reminder to go to the ER if any CP, SOB, nausea, dizziness, severe HA, changes vision/speech, left arm numbness and tingling and jaw pain. -     CBC with Differential/Platelet -     EKG 12-Lead  Type 2 diabetes mellitus with stage 2 chronic kidney disease, without long-term current use of insulin (HCC) Continue diet, exercise and weight loss Continue Ozempic 2 mg SQ QW  -     COMPLETE METABOLIC PANEL WITH GFR -     Hemoglobin A1c -     Urinalysis, Routine w reflex microscopic -     Microalbumin / creatinine urine ratio  Type 2 diabetes mellitus with hyperlipidemia (HCC) Continue diet, exercise  Continue Rosuvastatin 40 mg every day and Zetia 10 mg every day  Encourage daily checking of blood sugar -     COMPLETE METABOLIC PANEL WITH GFR -     Lipid panel -     TSH  Type 2 diabetes mellitus with morbid obesity (HCC) Continue diet, exercise and weight loss,  CKD stage 2 due to type 2 diabetes mellitus (HCC) Increase fluids  Avoid NSAIDS Blood pressure control Monitor sugars  Will continue to monitor  Morbid obesity (HCC)/BMI 50.0-59.9, adult (HCC) Has lost 13 pounds in 3 months on Ozempic Continue diet and exercise   Vitamin D deficiency Continue supplementation to maintain goal of 60-100 Taking Vitamin D 50,000 IU twice a week -     VITAMIN D 25 Hydroxy (Vit-D Deficiency, Fractures)  Elevated LFT'S Avoid Tylenol and Alcohol, continue diet and exercise - CMP  Carpal tunnel left hand Continue to use cock up splint Heat/ice Monitor and if symptoms worsen will refer to ortho  Screening for  blood or protein in urine -     Urinalysis, Routine w reflex culture -     Microalbumin / creatinine urine ratio  Screening for ischemic heart disease -     EKG 12-Lead  Medication management -     Magnesium  Screening  for AAA - U/S ABD RETROPERITONEAL LTD  Screening for thyroid disorder - TSH    Discussed med's effects and SE's. Screening labs and tests as requested with regular follow-up as recommended. Over 40 minutes of face to face interview, exam, counseling, chart review, and complex, high level critical decision making was performed this visit.    Future Appointments  Date Time Provider Department Center  03/31/2024  9:00 AM Raynelle Dick, NP [facility] None    HPI  63 y.o. female  presents for a complete physical and follow up for has Prediabetes; Essential hypertension; Hyperlipidemia; Environmental allergies; Morbid obesity (HCC); History of colon polyps; Medication management; Vitamin D deficiency; CKD stage 2 due to type 2 diabetes mellitus (HCC); and Type 2 diabetes mellitus with morbid obesity (HCC) on their problem list..   BMI is Body mass index is 52.52 kg/m., she has been working on diet and exercise. She is on Ozempic 2 mg SQ QW - down 13 pounds in the past 3 months Wt Readings from Last 3 Encounters:  03/29/23 (!) 315 lb 9.6 oz (143.2 kg)  12/19/22 (!) 328 lb (148.8 kg)  09/12/22 (!) 325 lb (147.4  kg)   She is being followed for elevated LFT's- were improved last check 09/25/21. Lab Results  Component Value Date   ALT 21 12/19/2022   AST 28 12/19/2022   ALKPHOS 51 09/25/2016   BILITOT 0.7 12/19/2022    She is currently on Olmesartan HCTZ 40/25 mg daily. Her blood pressure has been controlled at home, today their BP is BP: 138/88  BP Readings from Last 3 Encounters:  03/29/23 138/88  12/19/22 119/75  09/12/22 115/70  She does not workout. She denies chest pain, shortness of breath, dizziness.   She is on cholesterol medication, Rosuvastatin  40 mg every day and Zetia 10 mg every day ,  and denies myalgias. Her cholesterol is not at goal. The cholesterol last visit was:   Lab Results  Component Value Date   CHOL 213 (H) 12/19/2022   HDL 54 12/19/2022   LDLCALC 132 (H) 12/19/2022   TRIG 157 (H) 12/19/2022   CHOLHDL 3.9 12/19/2022     She has been working on diet and exercise for prediabetes, she is not on bASA, she is on ACE/ARB, she is on Semaglutide 0.5 mg QW and denies nausea, paresthesia of the feet, polydipsia, polyuria, visual disturbances, vomiting and weight loss. She has not been checking her blood sugars  Last A1C in the office was:  Lab Results  Component Value Date   HGBA1C 6.6 (H) 12/19/2022     Last GFR: Lab Results  Component Value Date   EGFR 57 (L) 12/19/2022    Patient is on Vitamin D supplement 13244 units three times a week for deficiency.   Lab Results  Component Value Date   VD25OH 25 (L) 02/18/2018      Current Medications:  Current Outpatient Medications on File Prior to Visit  Medication Sig Dispense Refill   cetirizine (ZYRTEC) 10 MG tablet Take 10 mg by mouth daily as needed for allergies.      Cholecalciferol (VITAMIN D) 125 MCG (5000 UT) CAPS Take by mouth.     ezetimibe (ZETIA) 10 MG tablet Take 1 tablet (10 mg total) by mouth daily. 30 tablet 11   glucose blood test strip Use as instructed 100 each 12   Lancets MISC Use to test blood sugar once daily 100 each 3   olmesartan-hydrochlorothiazide (BENICAR HCT) 40-25 MG tablet TAKE 1 TABLET DAILY FOR BLOOD PRESSURE & FLUID RETENTION Lynnda Child SWELLING 90 tablet 3   OVER THE COUNTER MEDICATION OTC acid reducer     pantoprazole (PROTONIX) 40 MG tablet TAKE 1 TABLET BY MOUTH EVERY DAY 90 tablet 1   rosuvastatin (CRESTOR) 40 MG tablet TAKE 1 TABLET BY MOUTH EVERY DAY 90 tablet 1   Semaglutide, 2 MG/DOSE, (OZEMPIC, 2 MG/DOSE,) 8 MG/3ML SOPN Inject 2 mg into the skin once a week. 3 mL 3   No current facility-administered medications on file prior  to visit.   Allergies:  No Known Allergies Medical History:  She has Prediabetes; Essential hypertension; Hyperlipidemia; Environmental allergies; Morbid obesity (HCC); History of colon polyps; Medication management; Vitamin D deficiency; CKD stage 2 due to type 2 diabetes mellitus (HCC); and Type 2 diabetes mellitus with morbid obesity (HCC) on their problem list. Health Maintenance:   Immunization History  Administered Date(s) Administered   Fluzone Influenza virus vaccine,trivalent (IIV3), split virus 12/19/2022   Influenza Inj Mdck Quad Pf 01/08/2022   Influenza Inj Mdck Quad With Preservative 01/11/2019, 01/11/2019   Influenza,inj,Quad PF,6+ Mos 02/26/2018, 12/18/2020   Influenza,inj,quad, With Preservative 12/26/2015   Influenza-Unspecified  01/28/2014, 01/11/2020   PFIZER(Purple Top)SARS-COV-2 Vaccination 03/07/2020   PPD Test 08/19/2013   Td 09/06/2003   Tdap 08/19/2013   Unspecified SARS-COV-2 Vaccination 03/07/2023   Zoster Recombinant(Shingrix) 05/30/2022    Health Maintenance  Topic Date Due   Medicare Annual Wellness (AWV)  Never done   Pneumococcal Vaccine 2-58 Years old (1 of 2 - PCV) Never done   HIV Screening  Never done   Hepatitis C Screening  Never done   OPHTHALMOLOGY EXAM  02/21/2017   MAMMOGRAM  05/22/2019   Cervical Cancer Screening (HPV/Pap Cotest)  05/23/2020   Zoster Vaccines- Shingrix (2 of 2) 07/25/2022   Diabetic kidney evaluation - Urine ACR  02/13/2023   COVID-19 Vaccine (3 - Mixed Product risk series) 04/04/2023   HEMOGLOBIN A1C  06/19/2023   DTaP/Tdap/Td (3 - Td or Tdap) 08/20/2023   Colonoscopy  12/16/2023   Diabetic kidney evaluation - eGFR measurement  12/19/2023   FOOT EXAM  03/28/2024   INFLUENZA VACCINE  Completed   HPV VACCINES  Aged Out     Last Dental Exam: Dr Albin Felling 2022 Last Eye Exam:[facility], 11/2021  Patient Care Team: Lucky Cowboy, MD as PCP - General (Internal Medicine) Richarda Overlie, MD as Consulting  Physician (Obstetrics and Gynecology)  Surgical History:  She has a past surgical history that includes Cesarean section; Umbilical hernia repair; Tubal ligation; and Colonoscopy with propofol (N/A, 12/15/2013). Family History:  Herfamily history includes Alcohol abuse in her mother; Cancer in her father; Cancer (age of onset: 73) in her sister; Diabetes in her paternal grandmother and son; Hypertension in her mother. Social History:  She reports that she has never smoked. She has never used smokeless tobacco. She reports that she does not drink alcohol and does not use drugs.  Review of Systems: Review of Systems  Constitutional:  Negative for chills and fever.  HENT:  Negative for congestion, hearing loss, sinus pain, sore throat and tinnitus.   Eyes:  Negative for blurred vision and double vision.  Respiratory:  Negative for cough, hemoptysis, sputum production, shortness of breath and wheezing.   Cardiovascular:  Negative for chest pain, palpitations and leg swelling.  Gastrointestinal:  Positive for diarrhea (depending on foods she eats with ozempic). Negative for abdominal pain, constipation, heartburn, nausea and vomiting.  Genitourinary:  Negative for dysuria and urgency.  Musculoskeletal:  Negative for back pain, falls, joint pain, myalgias and neck pain.  Skin:  Negative for rash.  Neurological:  Positive for tingling (left hand). Negative for dizziness, tremors, weakness and headaches.  Endo/Heme/Allergies:  Does not bruise/bleed easily.  Psychiatric/Behavioral:  Negative for depression and suicidal ideas. The patient is not nervous/anxious and does not have insomnia.     Physical Exam: Estimated body mass index is 52.52 kg/m as calculated from the following:   Height as of this encounter: 5\' 5"  (1.651 m).   Weight as of this encounter: 315 lb 9.6 oz (143.2 kg). BP 138/88   Pulse 82   Temp 97.8 F (36.6 C)   Ht 5\' 5"  (1.651 m)   Wt (!) 315 lb 9.6 oz (143.2 kg)   LMP  06/11/2014   SpO2 99%   BMI 52.52 kg/m  General Appearance: very pleasant morbidly obese female, in no apparent distress.  Eyes: PERRLA, EOMs, conjunctiva no swelling or erythema Sinuses: No Frontal/maxillary tenderness  ENT/Mouth: Ext aud canals clear, normal light reflex with TMs without erythema, bulging. Good dentition. No erythema, swelling, or exudate on post pharynx. Tonsils not swollen or  erythematous. Hearing normal.  Neck: Supple, thyroid normal. No bruits  Respiratory: Respiratory effort normal, BS equal bilaterally without rales, rhonchi, wheezing or stridor.  Cardio: RRR without murmurs, rubs or gallops. Brisk peripheral pulses, +1 pitting edma bilaterally, right worse than left. Chest: symmetric, with normal excursions and percussion.  Breasts: defer to gyn Abdomen: Soft obese, nontender, no guarding, rebound, difficult exam due to body habitus Lymphatics: Non tender without lymphadenopathy.  Genitourinary: defer to gyn  Musculoskeletal: Full ROM all peripheral extremities,5/5 strength, and normal gait.  Skin: Warm, dry without rashes, lesions, ecchymosis. Neuro: Cranial nerves intact, reflexes equal bilaterally. Normal muscle tone, no cerebellar symptoms. Sensation intact. + Tinel on left Psych: Awake and oriented X 3, normal affect, Insight and Judgment appropriate.   EKG: NSR, no ST changes. AAA: < 3 cm   [facility]  03/29/2023

## 2023-03-29 ENCOUNTER — Encounter: Payer: Self-pay | Admitting: Nurse Practitioner

## 2023-03-29 ENCOUNTER — Ambulatory Visit (INDEPENDENT_AMBULATORY_CARE_PROVIDER_SITE_OTHER): Payer: No Typology Code available for payment source | Admitting: Nurse Practitioner

## 2023-03-29 VITALS — BP 138/88 | HR 82 | Temp 97.8°F | Ht 65.0 in | Wt 315.6 lb

## 2023-03-29 DIAGNOSIS — I10 Essential (primary) hypertension: Secondary | ICD-10-CM

## 2023-03-29 DIAGNOSIS — Z136 Encounter for screening for cardiovascular disorders: Secondary | ICD-10-CM | POA: Diagnosis not present

## 2023-03-29 DIAGNOSIS — Z0000 Encounter for general adult medical examination without abnormal findings: Secondary | ICD-10-CM

## 2023-03-29 DIAGNOSIS — Z1389 Encounter for screening for other disorder: Secondary | ICD-10-CM

## 2023-03-29 DIAGNOSIS — G5602 Carpal tunnel syndrome, left upper limb: Secondary | ICD-10-CM

## 2023-03-29 DIAGNOSIS — E559 Vitamin D deficiency, unspecified: Secondary | ICD-10-CM

## 2023-03-29 DIAGNOSIS — I700 Atherosclerosis of aorta: Secondary | ICD-10-CM | POA: Diagnosis not present

## 2023-03-29 DIAGNOSIS — R7989 Other specified abnormal findings of blood chemistry: Secondary | ICD-10-CM

## 2023-03-29 DIAGNOSIS — Z0001 Encounter for general adult medical examination with abnormal findings: Secondary | ICD-10-CM

## 2023-03-29 DIAGNOSIS — E1169 Type 2 diabetes mellitus with other specified complication: Secondary | ICD-10-CM

## 2023-03-29 DIAGNOSIS — Z1329 Encounter for screening for other suspected endocrine disorder: Secondary | ICD-10-CM

## 2023-03-29 DIAGNOSIS — E1122 Type 2 diabetes mellitus with diabetic chronic kidney disease: Secondary | ICD-10-CM

## 2023-03-29 DIAGNOSIS — K219 Gastro-esophageal reflux disease without esophagitis: Secondary | ICD-10-CM

## 2023-03-29 DIAGNOSIS — Z79899 Other long term (current) drug therapy: Secondary | ICD-10-CM

## 2023-03-29 NOTE — Patient Instructions (Signed)

## 2023-03-30 ENCOUNTER — Other Ambulatory Visit: Payer: Self-pay | Admitting: Nurse Practitioner

## 2023-03-30 ENCOUNTER — Encounter: Payer: Self-pay | Admitting: Nurse Practitioner

## 2023-03-30 LAB — COMPLETE METABOLIC PANEL WITH GFR
AG Ratio: 1.7 (calc) (ref 1.0–2.5)
ALT: 26 U/L (ref 6–29)
AST: 31 U/L (ref 10–35)
Albumin: 4.3 g/dL (ref 3.6–5.1)
Alkaline phosphatase (APISO): 34 U/L — ABNORMAL LOW (ref 37–153)
BUN/Creatinine Ratio: 16 (calc) (ref 6–22)
BUN: 17 mg/dL (ref 7–25)
CO2: 33 mmol/L — ABNORMAL HIGH (ref 20–32)
Calcium: 9.9 mg/dL (ref 8.6–10.4)
Chloride: 98 mmol/L (ref 98–110)
Creat: 1.08 mg/dL — ABNORMAL HIGH (ref 0.50–1.05)
Globulin: 2.6 g/dL (ref 1.9–3.7)
Glucose, Bld: 96 mg/dL (ref 65–99)
Potassium: 3.7 mmol/L (ref 3.5–5.3)
Sodium: 141 mmol/L (ref 135–146)
Total Bilirubin: 0.6 mg/dL (ref 0.2–1.2)
Total Protein: 6.9 g/dL (ref 6.1–8.1)
eGFR: 59 mL/min/{1.73_m2} — ABNORMAL LOW (ref >=60)

## 2023-03-30 LAB — URINALYSIS, ROUTINE W REFLEX MICROSCOPIC
Bilirubin Urine: NEGATIVE
Glucose, UA: NEGATIVE
Hgb urine dipstick: NEGATIVE
Ketones, ur: NEGATIVE
Leukocytes,Ua: NEGATIVE
Nitrite: NEGATIVE
Protein, ur: NEGATIVE
Specific Gravity, Urine: 1.016 (ref 1.001–1.035)
pH: 5.5 (ref 5.0–8.0)

## 2023-03-30 LAB — CBC WITH DIFFERENTIAL/PLATELET
Absolute Lymphocytes: 2424 {cells}/uL (ref 850–3900)
Absolute Monocytes: 413 {cells}/uL (ref 200–950)
Basophils Absolute: 38 {cells}/uL (ref 0–200)
Basophils Relative: 0.8 %
Eosinophils Absolute: 48 {cells}/uL (ref 15–500)
Eosinophils Relative: 1 %
HCT: 44.4 % (ref 35.0–45.0)
Hemoglobin: 14.5 g/dL (ref 11.7–15.5)
MCH: 27.8 pg (ref 27.0–33.0)
MCHC: 32.7 g/dL (ref 32.0–36.0)
MCV: 85.2 fL (ref 80.0–100.0)
MPV: 10.8 fL (ref 7.5–12.5)
Monocytes Relative: 8.6 %
Neutro Abs: 1877 {cells}/uL (ref 1500–7800)
Neutrophils Relative %: 39.1 %
Platelets: 309 10*3/uL (ref 140–400)
RBC: 5.21 10*6/uL — ABNORMAL HIGH (ref 3.80–5.10)
RDW: 13.4 % (ref 11.0–15.0)
Total Lymphocyte: 50.5 %
WBC: 4.8 10*3/uL (ref 3.8–10.8)

## 2023-03-30 LAB — LIPID PANEL
Cholesterol: 205 mg/dL — ABNORMAL HIGH (ref <200)
HDL: 53 mg/dL (ref >=50)
LDL Cholesterol (Calc): 126 mg/dL — ABNORMAL HIGH
Non-HDL Cholesterol (Calc): 152 mg/dL — ABNORMAL HIGH (ref <130)
Total CHOL/HDL Ratio: 3.9 (calc) (ref <5.0)
Triglycerides: 151 mg/dL — ABNORMAL HIGH (ref <150)

## 2023-03-30 LAB — HEMOGLOBIN A1C W/OUT EAG: Hgb A1c MFr Bld: 6.2 %{Hb} — ABNORMAL HIGH (ref <5.7)

## 2023-03-30 LAB — VITAMIN D 25 HYDROXY (VIT D DEFICIENCY, FRACTURES): Vit D, 25-Hydroxy: 56 ng/mL (ref 30–100)

## 2023-03-30 LAB — TSH: TSH: 2.05 m[IU]/L (ref 0.40–4.50)

## 2023-03-30 LAB — MICROALBUMIN / CREATININE URINE RATIO
Creatinine, Urine: 185 mg/dL (ref 20–275)
Microalb Creat Ratio: 5 mg/g{creat} (ref <30)
Microalb, Ur: 1 mg/dL

## 2023-03-30 LAB — MAGNESIUM: Magnesium: 2 mg/dL (ref 1.5–2.5)

## 2023-04-23 ENCOUNTER — Other Ambulatory Visit: Payer: Self-pay

## 2023-04-23 DIAGNOSIS — E1169 Type 2 diabetes mellitus with other specified complication: Secondary | ICD-10-CM

## 2023-04-23 MED ORDER — OZEMPIC (2 MG/DOSE) 8 MG/3ML ~~LOC~~ SOPN: 2.0000 mg | PEN_INJECTOR | SUBCUTANEOUS | 1 refills | Status: DC

## 2023-06-09 ENCOUNTER — Emergency Department (HOSPITAL_COMMUNITY)
Admission: EM | Admit: 2023-06-09 | Discharge: 2023-06-11 | Disposition: E | Attending: Emergency Medicine | Admitting: Emergency Medicine

## 2023-06-09 DIAGNOSIS — I10 Essential (primary) hypertension: Secondary | ICD-10-CM | POA: Insufficient documentation

## 2023-06-09 DIAGNOSIS — R1110 Vomiting, unspecified: Secondary | ICD-10-CM | POA: Insufficient documentation

## 2023-06-09 DIAGNOSIS — W06XXXA Fall from bed, initial encounter: Secondary | ICD-10-CM | POA: Insufficient documentation

## 2023-06-09 DIAGNOSIS — R739 Hyperglycemia, unspecified: Secondary | ICD-10-CM | POA: Insufficient documentation

## 2023-06-09 DIAGNOSIS — Z79899 Other long term (current) drug therapy: Secondary | ICD-10-CM | POA: Diagnosis not present

## 2023-06-09 DIAGNOSIS — I469 Cardiac arrest, cause unspecified: Secondary | ICD-10-CM | POA: Diagnosis present

## 2023-06-09 DIAGNOSIS — Y92003 Bedroom of unspecified non-institutional (private) residence as the place of occurrence of the external cause: Secondary | ICD-10-CM | POA: Insufficient documentation

## 2023-06-09 DIAGNOSIS — R410 Disorientation, unspecified: Secondary | ICD-10-CM | POA: Diagnosis not present

## 2023-06-09 LAB — I-STAT VENOUS BLOOD GAS, ED
Acid-base deficit: 15 mmol/L — ABNORMAL HIGH (ref 0.0–2.0)
Bicarbonate: 19.2 mmol/L — ABNORMAL LOW (ref 20.0–28.0)
Calcium, Ion: 1.14 mmol/L — ABNORMAL LOW (ref 1.15–1.40)
HCT: 39 % (ref 36.0–46.0)
Hemoglobin: 13.3 g/dL (ref 12.0–15.0)
O2 Saturation: 13 %
Potassium: 3.7 mmol/L (ref 3.5–5.1)
Sodium: 142 mmol/L (ref 135–145)
TCO2: 22 mmol/L (ref 22–32)
pCO2, Ven: 94.4 mmHg (ref 44–60)
pH, Ven: 6.918 — CL (ref 7.25–7.43)
pO2, Ven: 20 mmHg — CL (ref 32–45)

## 2023-06-09 LAB — I-STAT CHEM 8, ED
BUN: 15 mg/dL (ref 6–20)
Calcium, Ion: 1.04 mmol/L — ABNORMAL LOW (ref 1.15–1.40)
Chloride: 108 mmol/L (ref 98–111)
Creatinine, Ser: 1.4 mg/dL — ABNORMAL HIGH (ref 0.44–1.00)
Glucose, Bld: 423 mg/dL — ABNORMAL HIGH (ref 70–99)
HCT: 42 % (ref 36.0–46.0)
Hemoglobin: 14.3 g/dL (ref 12.0–15.0)
Potassium: 3.5 mmol/L (ref 3.5–5.1)
Sodium: 141 mmol/L (ref 135–145)
TCO2: 16 mmol/L — ABNORMAL LOW (ref 22–32)

## 2023-06-09 LAB — I-STAT CG4 LACTIC ACID, ED: Lactic Acid, Venous: >15 mmol/L (ref 0.5–1.9)

## 2023-06-09 LAB — CBG MONITORING, ED: Glucose-Capillary: 275 mg/dL — ABNORMAL HIGH (ref 70–99)

## 2023-06-09 MED ORDER — ATROPINE SULFATE 1 MG/10ML IJ SOSY
PREFILLED_SYRINGE | INTRAMUSCULAR | Status: AC | PRN
  Administered 2023-06-09: 1 mg via INTRAVENOUS

## 2023-06-09 MED ORDER — EPINEPHRINE 1 MG/10ML IJ SOSY
PREFILLED_SYRINGE | INTRAMUSCULAR | Status: AC | PRN
  Administered 2023-06-09 (×4): 1 mg via INTRAVENOUS

## 2023-06-11 NOTE — Progress Notes (Signed)
    1000  Spiritual Encounters  Type of Visit Initial  Care provided to: [facility] partners present during encounter Physician  Referral source Physician  Reason for visit Patient death  OnCall Visit Yes   Chaplain responded to page that Pt had died unexpectedly and family was very upset. Chaplain provided spiritual care to Pt's husband and Pt's two sons and later Pt's brother. Police were present. Family has now left hospital.  Chaplain Daron Offer

## 2023-06-11 NOTE — Progress Notes (Signed)
 Pt intubated on arrival. ETT remains in place.

## 2023-06-11 NOTE — ED Notes (Signed)
 Pt BIB [facility] as cardiac arrest. EMS was initially dispatched for a fall. Pt was found on the floor w unknown down time. Pt was initially alert w EMS. But started coding while she was in the truck. Pt received 2 epi and ongoing CPR w EMS enroute. Pt is unresponsive upon arrival.

## 2023-06-11 NOTE — ED Notes (Signed)
Unable to obtain vitals

## 2023-06-11 NOTE — Code Documentation (Signed)
 Patient time of death occurred at 91

## 2023-06-11 DEATH — deceased

## 2023-07-05 ENCOUNTER — Ambulatory Visit: Payer: No Typology Code available for payment source | Admitting: Nurse Practitioner

## 2024-03-31 ENCOUNTER — Encounter: Payer: No Typology Code available for payment source | Admitting: Nurse Practitioner
